# Patient Record
Sex: Female | Born: 1975 | Race: Black or African American | Hispanic: No | Marital: Single | Smoking: Current every day smoker
Health system: Southern US, Community
[De-identification: ages and names within clinical notes are randomized; demographics above are authoritative.]

## PROBLEM LIST (undated history)

## (undated) ENCOUNTER — Emergency Department (HOSPITAL_COMMUNITY): Payer: Self-pay | Source: Home / Self Care

## (undated) DIAGNOSIS — M5430 Sciatica, unspecified side: Secondary | ICD-10-CM

## (undated) DIAGNOSIS — F32A Depression, unspecified: Secondary | ICD-10-CM

## (undated) DIAGNOSIS — I10 Essential (primary) hypertension: Secondary | ICD-10-CM

## (undated) DIAGNOSIS — F419 Anxiety disorder, unspecified: Secondary | ICD-10-CM

## (undated) DIAGNOSIS — F329 Major depressive disorder, single episode, unspecified: Secondary | ICD-10-CM

## (undated) DIAGNOSIS — M1990 Unspecified osteoarthritis, unspecified site: Secondary | ICD-10-CM

## (undated) DIAGNOSIS — R569 Unspecified convulsions: Secondary | ICD-10-CM

## (undated) HISTORY — PX: TUBAL LIGATION: SHX77

## (undated) HISTORY — DX: Sciatica, unspecified side: M54.30

## (undated) HISTORY — DX: Unspecified osteoarthritis, unspecified site: M19.90

---

## 2004-08-22 ENCOUNTER — Emergency Department: Payer: Self-pay | Admitting: Emergency Medicine

## 2004-11-18 ENCOUNTER — Emergency Department: Payer: Self-pay | Admitting: Emergency Medicine

## 2004-11-18 ENCOUNTER — Other Ambulatory Visit: Payer: Self-pay

## 2004-11-21 ENCOUNTER — Emergency Department: Payer: Self-pay | Admitting: Emergency Medicine

## 2005-04-28 ENCOUNTER — Emergency Department: Payer: Self-pay | Admitting: Unknown Physician Specialty

## 2005-05-02 ENCOUNTER — Other Ambulatory Visit: Payer: Self-pay

## 2005-05-02 ENCOUNTER — Emergency Department: Payer: Self-pay | Admitting: Internal Medicine

## 2005-07-05 ENCOUNTER — Emergency Department: Payer: Self-pay | Admitting: General Practice

## 2005-08-27 ENCOUNTER — Emergency Department: Payer: Self-pay | Admitting: Emergency Medicine

## 2005-12-22 ENCOUNTER — Emergency Department: Payer: Self-pay | Admitting: Emergency Medicine

## 2006-07-24 ENCOUNTER — Emergency Department: Payer: Self-pay | Admitting: Emergency Medicine

## 2007-05-16 ENCOUNTER — Emergency Department: Payer: Self-pay | Admitting: Emergency Medicine

## 2007-06-22 ENCOUNTER — Emergency Department: Payer: Self-pay | Admitting: Emergency Medicine

## 2007-06-30 ENCOUNTER — Emergency Department: Payer: Self-pay | Admitting: Emergency Medicine

## 2007-07-07 ENCOUNTER — Emergency Department: Payer: Self-pay | Admitting: Emergency Medicine

## 2008-04-18 ENCOUNTER — Emergency Department: Payer: Self-pay | Admitting: Emergency Medicine

## 2008-07-22 ENCOUNTER — Emergency Department: Payer: Self-pay | Admitting: Emergency Medicine

## 2008-11-19 ENCOUNTER — Emergency Department: Payer: Self-pay | Admitting: Emergency Medicine

## 2010-07-12 ENCOUNTER — Emergency Department: Payer: Self-pay | Admitting: Emergency Medicine

## 2010-10-15 ENCOUNTER — Emergency Department: Payer: Self-pay | Admitting: Emergency Medicine

## 2010-11-20 ENCOUNTER — Emergency Department: Payer: Self-pay | Admitting: Emergency Medicine

## 2011-05-26 ENCOUNTER — Emergency Department: Payer: Self-pay | Admitting: *Deleted

## 2011-05-26 LAB — COMPREHENSIVE METABOLIC PANEL WITH GFR
Albumin: 3.3 g/dL — ABNORMAL LOW
Alkaline Phosphatase: 66 U/L
Anion Gap: 6 — ABNORMAL LOW
BUN: 6 mg/dL — ABNORMAL LOW
Bilirubin,Total: 0.6 mg/dL
Calcium, Total: 8.4 mg/dL — ABNORMAL LOW
Chloride: 105 mmol/L
Co2: 28 mmol/L
Creatinine: 0.84 mg/dL
EGFR (African American): >60
EGFR (Non-African Amer.): >60
Glucose: 124 mg/dL — ABNORMAL HIGH
Osmolality: 277
Potassium: 3.1 mmol/L — ABNORMAL LOW
SGOT(AST): 20 U/L
SGPT (ALT): 23 U/L
Sodium: 139 mmol/L
Total Protein: 7 g/dL

## 2011-05-26 LAB — CBC
HGB: 13.6 g/dL (ref 12.0–16.0)
MCH: 30.7 pg (ref 26.0–34.0)
MCHC: 33.7 g/dL (ref 32.0–36.0)
MCV: 91 fL (ref 80–100)
Platelet: 203 10*3/uL (ref 150–440)
RDW: 15.2 % — ABNORMAL HIGH (ref 11.5–14.5)
WBC: 9.5 10*3/uL (ref 3.6–11.0)

## 2012-01-03 ENCOUNTER — Emergency Department: Payer: Self-pay | Admitting: Emergency Medicine

## 2012-01-03 DIAGNOSIS — N9489 Other specified conditions associated with female genital organs and menstrual cycle: Secondary | ICD-10-CM | POA: Insufficient documentation

## 2012-01-03 DIAGNOSIS — R102 Pelvic and perineal pain: Secondary | ICD-10-CM | POA: Insufficient documentation

## 2012-01-03 LAB — PREGNANCY, URINE: Pregnancy Test, Urine: NEGATIVE m[IU]/mL

## 2012-01-03 LAB — URINALYSIS, COMPLETE
Hyaline Cast: 1
Ketone: NEGATIVE
Nitrite: POSITIVE
Ph: 6 (ref 4.5–8.0)
Protein: 30
RBC,UR: 1 /HPF (ref 0–5)
Specific Gravity: 1.016 (ref 1.003–1.030)
WBC UR: 35 /HPF (ref 0–5)

## 2012-01-03 LAB — CBC WITH DIFFERENTIAL/PLATELET
Basophil %: 0.5 %
Eosinophil %: 0.4 %
HCT: 39.9 % (ref 35.0–47.0)
HGB: 13.5 g/dL (ref 12.0–16.0)
Lymphocyte #: 2.8 10*3/uL (ref 1.0–3.6)
MCH: 31 pg (ref 26.0–34.0)
MCHC: 33.8 g/dL (ref 32.0–36.0)
MCV: 92 fL (ref 80–100)
Monocyte %: 4.3 %
Neutrophil %: 69 %
Platelet: 219 10*3/uL (ref 150–440)
RBC: 4.36 10*6/uL (ref 3.80–5.20)
WBC: 11 10*3/uL (ref 3.6–11.0)

## 2012-01-03 LAB — BASIC METABOLIC PANEL
Anion Gap: 8 (ref 7–16)
BUN: 13 mg/dL (ref 7–18)
Chloride: 107 mmol/L (ref 98–107)
Co2: 23 mmol/L (ref 21–32)
Creatinine: 0.81 mg/dL (ref 0.60–1.30)
Potassium: 3.4 mmol/L — ABNORMAL LOW (ref 3.5–5.1)
Sodium: 138 mmol/L (ref 136–145)

## 2012-01-03 LAB — PHENYTOIN LEVEL, TOTAL: Dilantin: 8.5 ug/mL — ABNORMAL LOW (ref 10.0–20.0)

## 2012-02-04 ENCOUNTER — Emergency Department: Payer: Self-pay | Admitting: Emergency Medicine

## 2012-02-04 LAB — COMPREHENSIVE METABOLIC PANEL
Albumin: 3.1 g/dL — ABNORMAL LOW (ref 3.4–5.0)
Alkaline Phosphatase: 121 U/L (ref 50–136)
Bilirubin,Total: <0.1 mg/dL — ABNORMAL LOW (ref 0.2–1.0)
Chloride: 112 mmol/L — ABNORMAL HIGH (ref 98–107)
Co2: 26 mmol/L (ref 21–32)
Creatinine: 0.86 mg/dL (ref 0.60–1.30)
Osmolality: 287 (ref 275–301)
Potassium: 3.5 mmol/L (ref 3.5–5.1)
SGOT(AST): 28 U/L (ref 15–37)
SGPT (ALT): 70 U/L (ref 12–78)
Total Protein: 6.7 g/dL (ref 6.4–8.2)

## 2012-02-04 LAB — CBC
HCT: 37.9 % (ref 35.0–47.0)
HGB: 12.5 g/dL (ref 12.0–16.0)
MCH: 30.1 pg (ref 26.0–34.0)
MCHC: 33 g/dL (ref 32.0–36.0)
MCV: 91 fL (ref 80–100)
Platelet: 214 x10 3/mm 3 (ref 150–440)
RBC: 4.17 X10 6/mm 3 (ref 3.80–5.20)
RDW: 14.4 % (ref 11.5–14.5)
WBC: 9.9 x10 3/mm 3 (ref 3.6–11.0)

## 2012-02-04 LAB — LIPASE, BLOOD: Lipase: 109 U/L (ref 73–393)

## 2012-09-27 LAB — COMPREHENSIVE METABOLIC PANEL
Albumin: 3.3 g/dL — ABNORMAL LOW (ref 3.4–5.0)
Alkaline Phosphatase: 73 U/L (ref 50–136)
BUN: 9 mg/dL (ref 7–18)
Calcium, Total: 8.8 mg/dL (ref 8.5–10.1)
Chloride: 107 mmol/L (ref 98–107)
Creatinine: 0.9 mg/dL (ref 0.60–1.30)
EGFR (African American): >60
EGFR (Non-African Amer.): >60
Osmolality: 277 (ref 275–301)
SGOT(AST): 20 U/L (ref 15–37)
SGPT (ALT): 24 U/L (ref 12–78)

## 2012-09-27 LAB — ETHANOL
Ethanol %: <0.003 % (ref 0.000–0.080)
Ethanol: <3 mg/dL

## 2012-09-27 LAB — DRUG SCREEN, URINE
Barbiturates, Ur Screen: NEGATIVE (ref ?–200)
Cannabinoid 50 Ng, Ur ~~LOC~~: POSITIVE (ref ?–50)
Cocaine Metabolite,Ur ~~LOC~~: NEGATIVE (ref ?–300)
Methadone, Ur Screen: NEGATIVE (ref ?–300)
Phencyclidine (PCP) Ur S: NEGATIVE (ref ?–25)

## 2012-09-27 LAB — URINALYSIS, COMPLETE
Blood: NEGATIVE
Cellular Cast: 2
Granular Cast: 2
Ketone: NEGATIVE
Leukocyte Esterase: NEGATIVE
Nitrite: NEGATIVE
Protein: 25
RBC,UR: 2 /HPF (ref 0–5)

## 2012-09-27 LAB — CBC
MCHC: 34.6 g/dL (ref 32.0–36.0)
RBC: 4.59 10*6/uL (ref 3.80–5.20)

## 2012-09-28 ENCOUNTER — Inpatient Hospital Stay: Payer: Self-pay | Admitting: Psychiatry

## 2013-01-24 ENCOUNTER — Encounter: Payer: Self-pay | Admitting: Family Medicine

## 2013-06-28 ENCOUNTER — Emergency Department: Payer: Self-pay | Admitting: Emergency Medicine

## 2013-06-28 LAB — TROPONIN I

## 2013-06-28 LAB — CBC
HCT: 41.3 % (ref 35.0–47.0)
HGB: 13.8 g/dL (ref 12.0–16.0)
MCH: 31.2 pg (ref 26.0–34.0)
MCHC: 33.5 g/dL (ref 32.0–36.0)
MCV: 93 fL (ref 80–100)
Platelet: 198 10*3/uL (ref 150–440)
RBC: 4.44 10*6/uL (ref 3.80–5.20)
RDW: 14.6 % — ABNORMAL HIGH (ref 11.5–14.5)
WBC: 10.6 10*3/uL (ref 3.6–11.0)

## 2013-06-28 LAB — BASIC METABOLIC PANEL
Anion Gap: 9 (ref 7–16)
BUN: 8 mg/dL (ref 7–18)
CREATININE: 0.88 mg/dL (ref 0.60–1.30)
Calcium, Total: 8.4 mg/dL — ABNORMAL LOW (ref 8.5–10.1)
Chloride: 106 mmol/L (ref 98–107)
Co2: 24 mmol/L (ref 21–32)
EGFR (Non-African Amer.): >60
Glucose: 143 mg/dL — ABNORMAL HIGH (ref 65–99)
Osmolality: 278 (ref 275–301)
POTASSIUM: 3.3 mmol/L — AB (ref 3.5–5.1)
Sodium: 139 mmol/L (ref 136–145)

## 2013-06-28 LAB — PHENYTOIN LEVEL, TOTAL: DILANTIN: 0.8 ug/mL — AB (ref 10.0–20.0)

## 2013-09-12 ENCOUNTER — Emergency Department: Payer: Self-pay | Admitting: Emergency Medicine

## 2013-09-12 LAB — PHENYTOIN LEVEL, TOTAL: DILANTIN: 12 ug/mL (ref 10.0–20.0)

## 2013-09-30 ENCOUNTER — Emergency Department: Payer: Self-pay | Admitting: Emergency Medicine

## 2014-01-02 ENCOUNTER — Emergency Department: Payer: Self-pay | Admitting: Emergency Medicine

## 2014-01-06 ENCOUNTER — Emergency Department: Payer: Self-pay | Admitting: Emergency Medicine
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
## 2014-06-14 ENCOUNTER — Emergency Department: Admit: 2014-06-14 | Discharge status: Against Medical Advice | Payer: Self-pay | Admitting: Emergency Medicine

## 2014-06-14 LAB — URINALYSIS, COMPLETE
Bilirubin,UR: NEGATIVE
Glucose,UR: NEGATIVE mg/dL (ref 0–75)
LEUKOCYTE ESTERASE: NEGATIVE
NITRITE: NEGATIVE
PH: 5 (ref 4.5–8.0)
Specific Gravity: 1.027 (ref 1.003–1.030)

## 2014-06-14 LAB — BASIC METABOLIC PANEL
ANION GAP: 9 (ref 7–16)
BUN: 10 mg/dL
Calcium, Total: 8.6 mg/dL — ABNORMAL LOW
Chloride: 107 mmol/L
Co2: 25 mmol/L
Creatinine: 1.01 mg/dL — ABNORMAL HIGH
EGFR (African American): >60
Glucose: 111 mg/dL — ABNORMAL HIGH
Potassium: 3 mmol/L — ABNORMAL LOW
SODIUM: 141 mmol/L

## 2014-06-14 LAB — PHENYTOIN LEVEL, TOTAL: DILANTIN: 3.5 ug/mL — AB

## 2014-06-14 LAB — PREGNANCY, URINE: Pregnancy Test, Urine: NEGATIVE m[IU]/mL

## 2014-06-14 LAB — MAGNESIUM: Magnesium: 2 mg/dL

## 2014-06-14 LAB — ETHANOL: Ethanol: <5 mg/dL

## 2015-08-16 ENCOUNTER — Encounter: Payer: Self-pay | Admitting: Emergency Medicine

## 2015-08-16 ENCOUNTER — Emergency Department
Admission: EM | Admit: 2015-08-16 | Discharge: 2015-08-16 | Disposition: A | Discharge status: Home / Self Care | Payer: Self-pay | Attending: Emergency Medicine | Admitting: Emergency Medicine

## 2015-08-16 DIAGNOSIS — Z5181 Encounter for therapeutic drug level monitoring: Secondary | ICD-10-CM

## 2015-08-16 DIAGNOSIS — I10 Essential (primary) hypertension: Secondary | ICD-10-CM | POA: Insufficient documentation

## 2015-08-16 DIAGNOSIS — G40909 Epilepsy, unspecified, not intractable, without status epilepticus: Secondary | ICD-10-CM | POA: Insufficient documentation

## 2015-08-16 DIAGNOSIS — E876 Hypokalemia: Secondary | ICD-10-CM | POA: Insufficient documentation

## 2015-08-16 DIAGNOSIS — Z87891 Personal history of nicotine dependence: Secondary | ICD-10-CM | POA: Insufficient documentation

## 2015-08-16 HISTORY — DX: Essential (primary) hypertension: I10

## 2015-08-16 HISTORY — DX: Unspecified convulsions: R56.9

## 2015-08-16 LAB — CBC WITH DIFFERENTIAL/PLATELET
BASOS ABS: 0.3 10*3/uL — AB (ref 0–0.1)
BASOS PCT: 3 %
EOS ABS: 0.1 10*3/uL (ref 0–0.7)
Eosinophils Relative: 1 %
HEMATOCRIT: 39.6 % (ref 35.0–47.0)
Hemoglobin: 13.5 g/dL (ref 12.0–16.0)
Lymphocytes Relative: 27 %
Lymphs Abs: 2.9 10*3/uL (ref 1.0–3.6)
MCH: 30.4 pg (ref 26.0–34.0)
MCHC: 34 g/dL (ref 32.0–36.0)
MCV: 89.3 fL (ref 80.0–100.0)
Monocytes Absolute: 0.4 10*3/uL (ref 0.2–0.9)
Monocytes Relative: 4 %
NEUTROS PCT: 65 %
Neutro Abs: 7.1 10*3/uL — ABNORMAL HIGH (ref 1.4–6.5)
Platelets: 189 10*3/uL (ref 150–440)
RBC: 4.44 MIL/uL (ref 3.80–5.20)
RDW: 14.9 % — AB (ref 11.5–14.5)
WBC: 10.8 10*3/uL (ref 3.6–11.0)

## 2015-08-16 LAB — BASIC METABOLIC PANEL
Anion gap: 9 (ref 5–15)
BUN: 12 mg/dL (ref 6–20)
CALCIUM: 8.5 mg/dL — AB (ref 8.9–10.3)
CO2: 23 mmol/L (ref 22–32)
CREATININE: 0.85 mg/dL (ref 0.44–1.00)
Chloride: 106 mmol/L (ref 101–111)
Glucose, Bld: 106 mg/dL — ABNORMAL HIGH (ref 65–99)
Potassium: 3.3 mmol/L — ABNORMAL LOW (ref 3.5–5.1)
SODIUM: 138 mmol/L (ref 135–145)

## 2015-08-16 LAB — PHENYTOIN LEVEL, TOTAL

## 2015-08-16 MED ORDER — SODIUM CHLORIDE 0.9 % IV SOLN
1000.0000 mg | Freq: Once | INTRAVENOUS | Status: AC
  Administered 2015-08-16: 1000 mg via INTRAVENOUS
  Filled 2015-08-16: qty 20

## 2015-08-16 MED ORDER — POTASSIUM CHLORIDE CRYS ER 20 MEQ PO TBCR
40.0000 meq | EXTENDED_RELEASE_TABLET | Freq: Once | ORAL | Status: AC
  Administered 2015-08-16: 40 meq via ORAL
  Filled 2015-08-16: qty 2

## 2015-08-16 MED ORDER — PHENYTOIN SODIUM EXTENDED 100 MG PO CAPS: 300.0000 mg | ORAL_CAPSULE | Freq: Two times a day (BID) | ORAL | Status: DC

## 2015-08-16 NOTE — ED Notes (Signed)
Pt presents to ED via [facility] for seizures. Per EMS pt has hx of seizures and states that per family pt had 4 seizures prior to EMS arrival and 1 grand mal seizure with EMS last approx 2 mins. Per EMS 2 mg Versed given en route, pt presents post-ictal but able to answer some questions at this time.
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
## 2015-08-16 NOTE — ED Notes (Signed)
NAD noted at time of D/C. Pt taken to the lobby via wheelchair. Pt states understanding of D/C instructions, denies comments/concerns at this time.

## 2015-08-16 NOTE — ED Notes (Signed)
Pt states she takes Dilantin, and has not had her medications today.

## 2015-08-16 NOTE — Discharge Instructions (Signed)
You were evaluated and treated for seizure. Return to emergency department for any worsening condition including repeat seizure, any confusion altered mental status, weakness or numbness, fever, or any other symptoms concerning to you.   Epilepsy Epilepsy is a disorder in which a person has repeated seizures over time. A seizure is a release of abnormal electrical activity in the brain. Seizures can cause a change in attention, behavior, or the ability to remain awake and alert (altered mental status). Seizures often involve uncontrollable shaking (convulsions).  Most people with epilepsy lead normal lives. However, people with epilepsy are at an increased risk of falls, accidents, and injuries. Therefore, it is important to begin treatment right away. CAUSES  Epilepsy has many possible causes. Anything that disturbs the normal pattern of brain cell activity can lead to seizures. This may include:   Head injury.  Birth trauma.  High fever as a child.  Stroke.  Bleeding into or around the brain.  Certain drugs.  Prolonged low oxygen, such as what occurs after CPR efforts.  Abnormal brain development.  Certain illnesses, such as meningitis, encephalitis (brain infection), malaria, and other infections.  An imbalance of nerve signaling chemicals (neurotransmitters).  SIGNS AND SYMPTOMS  The symptoms of a seizure can vary greatly from one person to another. Right before a seizure, you may have a warning (aura) that a seizure is about to occur. An aura may include the following symptoms:  Fear or anxiety.  Nausea.  Feeling like the room is spinning (vertigo).  Vision changes, such as seeing flashing lights or spots. Common symptoms during a seizure include:  Abnormal sensations, such as an abnormal smell or a bitter taste in the mouth.   Sudden, general body stiffness.   Convulsions that involve rhythmic jerking of the face, arm, or leg on one or both sides.   Sudden  change in consciousness.   Appearing to be awake but not responding.   Appearing to be asleep but cannot be awakened.   Grimacing, chewing, lip smacking, drooling, tongue biting, or loss of bowel or bladder control. After a seizure, you may feel sleepy for a while. DIAGNOSIS  Your health care provider will ask about your symptoms and take a medical history. Descriptions from any witnesses to your seizures will be very helpful in the diagnosis. A physical exam, including a detailed neurological exam, is necessary. Various tests may be done, such as:   An electroencephalogram (EEG). This is a painless test of your brain waves. In this test, a diagram is created of your brain waves. These diagrams can be interpreted by a specialist.  An MRI of the brain.   A CT scan of the brain.   A spinal tap (lumbar puncture, LP).  Blood tests to check for signs of infection or abnormal blood chemistry. TREATMENT  There is no cure for epilepsy, but it is generally treatable. Once epilepsy is diagnosed, it is important to begin treatment as soon as possible. For most people with epilepsy, seizures can be controlled with medicines. The following may also be used:  A pacemaker for the brain (vagus nerve stimulator) can be used for people with seizures that are not well controlled by medicine.  Surgery on the brain. For some people, epilepsy eventually goes away. HOME CARE INSTRUCTIONS   Follow your health care provider's recommendations on driving and safety in normal activities.  Get enough rest. Lack of sleep can cause seizures.  Only take over-the-counter or prescription medicines as directed by your health  care provider. Take any prescribed medicine exactly as directed.  Avoid any known triggers of your seizures.  Keep a seizure diary. Record what you recall about any seizure, especially any possible trigger.   Make sure the people you live and work with know that you are prone to  seizures. They should receive instructions on how to help you. In general, a witness to a seizure should:   Cushion your head and body.   Turn you on your side.   Avoid unnecessarily restraining you.   Not place anything inside your mouth.   Call for emergency medical help if there is any question about what has occurred.   Follow up with your health care provider as directed. You may need regular blood tests to monitor the levels of your medicine.  SEEK MEDICAL CARE IF:   You develop signs of infection or other illness. This might increase the risk of a seizure.   You seem to be having more frequent seizures.   Your seizure pattern is changing.  SEEK IMMEDIATE MEDICAL CARE IF:   You have a seizure that does not stop after a few moments.   You have a seizure that causes any difficulty in breathing.   You have a seizure that results in a very severe headache.   You have a seizure that leaves you with the inability to speak or use a part of your body.    This information is not intended to replace advice given to you by your health care provider. Make sure you discuss any questions you have with your health care provider.   Document Released: 02/01/2005 Document Revised: 11/22/2012 Document Reviewed: 09/13/2012 Elsevier Interactive Patient Education [employer]2016 Elsevier Inc.

## 2015-08-16 NOTE — ED Notes (Signed)
NAD noted at this time. Pt given phone at this time. States that she has a HA. NO change in patient condition. Pt is alert and oriented to person, place, and time. Pt sitting up in bed, lights dimmed for patient comfort at this time.

## 2015-11-14 ENCOUNTER — Encounter (HOSPITAL_COMMUNITY): Payer: Self-pay | Admitting: Emergency Medicine

## 2015-11-14 ENCOUNTER — Emergency Department (HOSPITAL_COMMUNITY)
Admission: EM | Admit: 2015-11-14 | Discharge: 2015-11-15 | Disposition: A | Discharge status: Home / Self Care | Payer: Self-pay | Attending: Emergency Medicine | Admitting: Emergency Medicine

## 2015-11-14 DIAGNOSIS — T426X6A Underdosing of other antiepileptic and sedative-hypnotic drugs, initial encounter: Secondary | ICD-10-CM | POA: Insufficient documentation

## 2015-11-14 DIAGNOSIS — R569 Unspecified convulsions: Secondary | ICD-10-CM

## 2015-11-14 DIAGNOSIS — G40909 Epilepsy, unspecified, not intractable, without status epilepticus: Secondary | ICD-10-CM | POA: Insufficient documentation

## 2015-11-14 DIAGNOSIS — I10 Essential (primary) hypertension: Secondary | ICD-10-CM | POA: Insufficient documentation

## 2015-11-14 DIAGNOSIS — Z87891 Personal history of nicotine dependence: Secondary | ICD-10-CM | POA: Insufficient documentation

## 2015-11-14 LAB — CBG MONITORING, ED: Glucose-Capillary: 123 mg/dL — ABNORMAL HIGH (ref 65–99)

## 2015-11-14 MED ORDER — PHENYTOIN SODIUM EXTENDED 100 MG PO CAPS: 300.0000 mg | ORAL_CAPSULE | Freq: Two times a day (BID) | ORAL | 0 refills | Status: DC

## 2015-11-14 MED ORDER — SODIUM CHLORIDE 0.9 % IV SOLN
2000.0000 mg | Freq: Once | INTRAVENOUS | Status: AC
  Administered 2015-11-14: 2000 mg via INTRAVENOUS
  Filled 2015-11-14: qty 40

## 2015-11-14 MED ORDER — ONDANSETRON HCL 4 MG/2ML IJ SOLN
4.0000 mg | Freq: Once | INTRAMUSCULAR | Status: AC
  Administered 2015-11-14: 4 mg via INTRAVENOUS
  Filled 2015-11-14: qty 2

## 2015-11-14 NOTE — Progress Notes (Signed)
[facility].  EDCM discussed patient with EDP Adela LankFloyd.  EDCM will fax [employer] coupon for dilantin to [facility].  [place] card program.  [facility] clinics [place] so that patient can find a pcp.  [facility] this information to [phone].  No further EDCM needs at this time.  Coupon from [employer] is for Dilantin 100mg  capsules #180 tablets [facility] at [facility]

## 2015-11-14 NOTE — ED Provider Notes (Signed)
[facility]-EMERGENCY DEPT Provider Note   CSN: [id number] Arrival date & time: 11/14/15  2014     History   Chief Complaint Chief Complaint  Patient presents with  . Seizures    HPI Kristy Kirby is a 40 y.o. female.  40 yo F with a chief complaint of a seizure. Asian has been off of her seizure medications because she is unable to afford it. She recently lost her Medicaid because her child turned [id number]. She is working on trying to get insurance through her work. She thinks she might have this accomplished next week. Denies head injury denies fevers.   The history is provided by the patient.  Seizures   This is a new problem. The current episode started less than 1 hour ago. The problem has been resolved. There was 1 seizure. The most recent episode lasted less than 30 seconds. Pertinent negatives include no headaches, no visual disturbance, no chest pain, no nausea and no vomiting. Characteristics include loss of consciousness. The episode was witnessed. The seizures did not continue in the ED. The seizure(s) had no focality. Possible causes include missed seizure meds. There has been no fever.    Past Medical History:  Diagnosis Date  . HTN (hypertension)   . Seizures (HCC)     There are no active problems to display for this patient.   Past Surgical History:  Procedure Laterality Date  . TUBAL LIGATION      OB History    No data available       Home Medications    Prior to Admission medications   Medication Sig Start Date End Date Taking? Authorizing Provider  phenytoin (DILANTIN) 100 MG ER capsule Take 3 capsules (300 mg total) by mouth 2 (two) times daily. 11/14/15   Melene Planan Saharah Sherrow, DO    Family History No family history on file.  Social History Social History  Substance Use Topics  . Smoking status: Former Games developermoker  . Smokeless tobacco: Not on file  . Alcohol use No     Allergies   Review of patient's allergies indicates no known allergies.   Review of  Systems Review of Systems  Constitutional: Negative for chills and fever.  HENT: Negative for congestion and rhinorrhea.   Eyes: Negative for redness and visual disturbance.  Respiratory: Negative for shortness of breath and wheezing.   Cardiovascular: Negative for chest pain and palpitations.  Gastrointestinal: Negative for nausea and vomiting.  Genitourinary: Negative for dysuria and urgency.  Musculoskeletal: Negative for arthralgias and myalgias.  Skin: Negative for pallor and wound.  Neurological: Positive for seizures and loss of consciousness. Negative for dizziness and headaches.     Physical Exam Updated Vital Signs BP 113/89   Pulse 87   Temp 98.6 F (37 C) (Oral)   Resp 15   Ht 5\' 2"  (1.575 m)   Wt 244 lb (110.7 kg)   SpO2 96%   BMI 44.63 kg/m   Physical Exam  Constitutional: She is oriented to person, place, and time. She appears well-developed and well-nourished. No distress.  HENT:  Head: Normocephalic and atraumatic.  Eyes: EOM are normal. Pupils are equal, round, and reactive to light.  Neck: Normal range of motion. Neck supple.  Cardiovascular: Normal rate and regular rhythm.  Exam reveals no gallop and no friction rub.   No murmur heard. Pulmonary/Chest: Effort normal. She has no wheezes. She has no rales.  Abdominal: Soft. She exhibits no distension. There is no tenderness.  Musculoskeletal: She exhibits no  edema or tenderness.  Neurological: She is alert and oriented to person, place, and time.  Grossly neurologically intact  Skin: Skin is warm and dry. She is not diaphoretic.  Psychiatric: She has a normal mood and affect. Her behavior is normal.  Nursing note and vitals reviewed.    ED Treatments / Results  Labs (all labs ordered are listed, but only abnormal results are displayed) Labs Reviewed  CBG MONITORING, ED - Abnormal; Notable for the following:       Result Value   Glucose-Capillary 123 (*)    All other components within normal  limits    EKG  EKG Interpretation None       Radiology No results found.  Procedures Procedures (including critical care time)  Medications Ordered in ED Medications  phenytoin (DILANTIN) 2,000 mg in sodium chloride 0.9 % 250 mL IVPB (0 mg Intravenous Stopped 11/14/15 2326)     Initial Impression / Assessment and Plan / ED Course  I have reviewed the triage vital signs and the nursing notes.  Pertinent labs & imaging results that were available during my care of the patient were reviewed by me and considered in my medical decision making (see chart for details).  Clinical Course    40 yo F With a chief complaint of a seizure. Patient has a history of a seizure disorder has not been taking her medications. She was loaded back on her Dilantin. Given a prescription. Case management saw the patient and offered help with the medicines that she is from out of [place].  11:36 PM:  I have discussed the diagnosis/risks/treatment options with the patient and believe the pt to be eligible for discharge home to follow-up with PCP, neurology. We also discussed returning to the ED immediately if new or worsening sx occur. We discussed the sx which are most concerning (e.g., sudden worsening pain, fever, inability to tolerate by mouth) that necessitate immediate return. Medications administered to the patient during their visit and any new prescriptions provided to the patient are listed below.  Medications given during this visit Medications  phenytoin (DILANTIN) 2,000 mg in sodium chloride 0.9 % 250 mL IVPB (0 mg Intravenous Stopped 11/14/15 2326)     The patient appears reasonably screen and/or stabilized for discharge and I doubt any other medical condition or other [facility] requiring further screening, evaluation, or treatment in the ED at this time prior to discharge.    Final Clinical Impressions(s) / ED Diagnoses   Final diagnoses:  Seizure [facility])    New Prescriptions Current  Discharge Medication List       Melene Plan, DO 11/14/15 2336

## 2015-11-14 NOTE — ED Triage Notes (Signed)
Per EMS, pt experienced a witnessed seizure at work, she did not fall, was lowered to the floor from a chair by coworker.  Hx of seizures and HNT.  She reports that she has not had her medications b/c "my medicaid was cut off".  EMS called sister who reported stress at home and questioned medication compliance.  Pt was postictal upon EMS arrival, "beginning to come around now."  She is alert and oriented at this time.

## 2016-01-28 ENCOUNTER — Emergency Department
Admission: EM | Admit: 2016-01-28 | Discharge: 2016-01-28 | Disposition: A | Discharge status: Home / Self Care | Payer: Self-pay | Attending: Emergency Medicine | Admitting: Emergency Medicine

## 2016-01-28 DIAGNOSIS — Z760 Encounter for issue of repeat prescription: Secondary | ICD-10-CM | POA: Insufficient documentation

## 2016-01-28 DIAGNOSIS — Z87891 Personal history of nicotine dependence: Secondary | ICD-10-CM | POA: Insufficient documentation

## 2016-01-28 DIAGNOSIS — R519 Headache, unspecified: Secondary | ICD-10-CM

## 2016-01-28 DIAGNOSIS — R51 Headache: Secondary | ICD-10-CM

## 2016-01-28 DIAGNOSIS — I10 Essential (primary) hypertension: Secondary | ICD-10-CM | POA: Insufficient documentation

## 2016-01-28 HISTORY — DX: Anxiety disorder, unspecified: F41.9

## 2016-01-28 HISTORY — DX: Depression, unspecified: F32.A

## 2016-01-28 HISTORY — DX: Major depressive disorder, single episode, unspecified: F32.9

## 2016-01-28 LAB — CBC
HEMATOCRIT: 44.4 % (ref 35.0–47.0)
HEMOGLOBIN: 15 g/dL (ref 12.0–16.0)
MCH: 30.9 pg (ref 26.0–34.0)
MCHC: 33.8 g/dL (ref 32.0–36.0)
MCV: 91.5 fL (ref 80.0–100.0)
Platelets: 189 10*3/uL (ref 150–440)
RBC: 4.85 MIL/uL (ref 3.80–5.20)
RDW: 15.1 % — AB (ref 11.5–14.5)
WBC: 8.9 10*3/uL (ref 3.6–11.0)

## 2016-01-28 LAB — BASIC METABOLIC PANEL
ANION GAP: 8 (ref 5–15)
BUN: 11 mg/dL (ref 6–20)
CHLORIDE: 105 mmol/L (ref 101–111)
CO2: 25 mmol/L (ref 22–32)
Calcium: 8.6 mg/dL — ABNORMAL LOW (ref 8.9–10.3)
Creatinine, Ser: 0.83 mg/dL (ref 0.44–1.00)
GFR calc Af Amer: >60 mL/min (ref >60)
GFR calc non Af Amer: >60 mL/min (ref >60)
GLUCOSE: 83 mg/dL (ref 65–99)
POTASSIUM: 3.5 mmol/L (ref 3.5–5.1)
Sodium: 138 mmol/L (ref 135–145)

## 2016-01-28 MED ORDER — METOCLOPRAMIDE HCL 10 MG PO TABS
10.0000 mg | ORAL_TABLET | Freq: Once | ORAL | Status: AC
  Administered 2016-01-28: 10 mg via ORAL
  Filled 2016-01-28 (×2): qty 1

## 2016-01-28 MED ORDER — DIPHENHYDRAMINE HCL 25 MG PO CAPS: 50.0000 mg | ORAL_CAPSULE | Freq: Four times a day (QID) | ORAL | 0 refills | Status: DC | PRN

## 2016-01-28 MED ORDER — HYDROCHLOROTHIAZIDE 25 MG PO TABS: 25.0000 mg | ORAL_TABLET | Freq: Every day | ORAL | 2 refills | Status: DC

## 2016-01-28 MED ORDER — DIPHENHYDRAMINE HCL 25 MG PO CAPS
50.0000 mg | ORAL_CAPSULE | Freq: Once | ORAL | Status: AC
  Administered 2016-01-28: 50 mg via ORAL
  Filled 2016-01-28: qty 2

## 2016-01-28 MED ORDER — LISINOPRIL 20 MG PO TABS: 20.0000 mg | ORAL_TABLET | Freq: Every day | ORAL | 2 refills | Status: DC

## 2016-01-28 MED ORDER — PHENYTOIN SODIUM EXTENDED 100 MG PO CAPS: 300.0000 mg | ORAL_CAPSULE | Freq: Every day | ORAL | 2 refills | Status: DC

## 2016-01-28 MED ORDER — METOCLOPRAMIDE HCL 10 MG PO TABS: 10.0000 mg | ORAL_TABLET | Freq: Four times a day (QID) | ORAL | 0 refills | Status: DC | PRN

## 2016-01-28 NOTE — ED Provider Notes (Signed)
[facility] Emergency Department Provider Note  ____________________________________________  Time seen: Approximately 4:46 PM  I have reviewed the triage vital signs and the nursing notes.   HISTORY  Chief Complaint Headache    HPI Kristy Kirby is a 40 y.o. female who complains of generalized headache for the past 2-3 days. No vision changes numbness tingling or weakness. No dizziness or syncope. Bilateral frontal parietal and occipital. No trauma. No nausea or vomiting. Tried taking ibuprofen without relief.  She recently lost her Medicaid coverage because her son turned 618, so she is uninsured. Because of that she has not been able to follow up with her doctor and ran out of all her medications about 3 months ago. This includes lisinopril HCTZ phenytoin and Prozac. Denies SI HI or worsening depressive symptoms. Reports a seizure about 3 months ago.   Past Medical History:  Diagnosis Date  . Anxiety   . Depression   . HTN (hypertension)   . Seizures (HCC)      There are no active problems to display for this patient.    Past Surgical History:  Procedure Laterality Date  . TUBAL LIGATION       Prior to Admission medications   Medication Sig Start Date End Date Taking? Authorizing Provider  diphenhydrAMINE (BENADRYL) 25 mg capsule Take 2 capsules (50 mg total) by mouth every 6 (six) hours as needed. 01/28/16   Sharman CheekPhillip Analyah Mcconnon, MD  hydrochlorothiazide (HYDRODIURIL) 25 MG tablet Take 1 tablet (25 mg total) by mouth daily. 01/28/16   Sharman CheekPhillip Lizzie Cokley, MD  lisinopril (PRINIVIL,ZESTRIL) 20 MG tablet Take 1 tablet (20 mg total) by mouth daily. 01/28/16 01/27/17  Sharman CheekPhillip Dior Stepter, MD  metoCLOPramide (REGLAN) 10 MG tablet Take 1 tablet (10 mg total) by mouth every 6 (six) hours as needed. 01/28/16   Sharman CheekPhillip Duey Liller, MD  phenytoin (DILANTIN) 100 MG ER capsule Take 3 capsules (300 mg total) by mouth daily. 01/28/16   Sharman CheekPhillip Shameca Landen, MD      Allergies Banana; Coconut fatty acids; Penicillins; and Sulfa antibiotics   No family history on file.  Social History Social History  Substance Use Topics  . Smoking status: Former Games developermoker  . Smokeless tobacco: Not on file  . Alcohol use No    Review of Systems  Constitutional:   No fever or chills.  ENT:   No sore throat. No rhinorrhea. Cardiovascular:   No chest pain. Respiratory:   No dyspnea or cough. Gastrointestinal:   Negative for abdominal pain, vomiting and diarrhea.  Genitourinary:   Negative for dysuria or difficulty urinating. Musculoskeletal:   Negative for focal pain or swelling Neurological:   Positive as above for headaches 10-point ROS otherwise negative.  ____________________________________________   PHYSICAL EXAM:  VITAL SIGNS: ED Triage Vitals  Enc Vitals Group     BP 01/28/16 1529 (!) 178/120     Pulse Rate 01/28/16 1529 91     Resp 01/28/16 1529 18     Temp 01/28/16 1529 97.9 F (36.6 C)     Temp Source 01/28/16 1529 Oral     SpO2 01/28/16 1529 100 %     Weight 01/28/16 1530 235 lb (106.6 kg)     Height 01/28/16 1530 5\' 2"  (1.575 m)     Head Circumference --      Peak Flow --      Pain Score 01/28/16 1530 7     Pain Loc --      Pain Edu? --  Excl. in GC? --     Vital signs reviewed, nursing assessments reviewed.   Constitutional:   Alert and oriented. Well appearing and in no distress. Eyes:   No scleral icterus. No conjunctival pallor. PERRL. EOMI.  No nystagmus. ENT   Head:   Normocephalic and atraumatic.Normal temporal artery pulsations, nontender   Nose:   No congestion/rhinnorhea. No septal hematoma   Mouth/Throat:   MMM, no pharyngeal erythema. No peritonsillar mass.    Neck:   No stridor. No SubQ emphysema. No meningismus. No midline spinal tenderness Hematological/Lymphatic/Immunilogical:   No cervical lymphadenopathy. Cardiovascular:   RRR. Symmetric bilateral radial and DP pulses.  No murmurs.   Respiratory:   Normal respiratory effort without tachypnea nor retractions. Breath sounds are clear and equal bilaterally. No wheezes/rales/rhonchi. Gastrointestinal:   Soft and nontender. Non distended. There is no CVA tenderness.  No rebound, rigidity, or guarding. Genitourinary:   deferred Musculoskeletal:   Nontender with normal range of motion in all extremities. No joint effusions.  No lower extremity tenderness.  No edema. Neurologic:   Normal speech and language.  CN 2-10 normal. Motor grossly intact. No gross focal neurologic deficits are appreciated.  Skin:    Skin is warm, dry and intact. No rash noted.  No petechiae, purpura, or bullae.  ____________________________________________    LABS (pertinent positives/negatives) (all labs ordered are listed, but only abnormal results are displayed) Labs Reviewed  BASIC METABOLIC PANEL - Abnormal; Notable for the following:       Result Value   Calcium 8.6 (*)    All other components within normal limits  CBC - Abnormal; Notable for the following:    RDW 15.1 (*)    All other components within normal limits   ____________________________________________   EKG  Interpreted by me Normal sinus rhythm rate of 84, normal axis intervals QRS ST segments and T waves.  ____________________________________________    RADIOLOGY    ____________________________________________   PROCEDURES Procedures  ____________________________________________   INITIAL IMPRESSION / ASSESSMENT AND PLAN / ED COURSE  Pertinent labs & imaging results that were available during my care of the patient were reviewed by me and considered in my medical decision making (see chart for details).  Patient well appearing no acute distress. Presents with headache in the setting of uncontrolled hypertension. Does not appear to be a hypertensive crisis. No other symptoms, no neuro deficits or other concerning exam features. Labs unremarkable, vitals  otherwise unremarkable except for stage III hypertension. I'll restart the patient on lisinopril and HCTZ which she was on in the past. I refill her phenytoin. Reglan and Benadryl for her headache for now. Follow up with primary care. [facility] resources given.     Clinical Course    ____________________________________________   FINAL CLINICAL IMPRESSION(S) / ED DIAGNOSES  Final diagnoses:  Essential hypertension  Generalized headache  Medication refill    New Prescriptions   DIPHENHYDRAMINE (BENADRYL) 25 MG CAPSULE    Take 2 capsules (50 mg total) by mouth every 6 (six) hours as needed.   HYDROCHLOROTHIAZIDE (HYDRODIURIL) 25 MG TABLET    Take 1 tablet (25 mg total) by mouth daily.   LISINOPRIL (PRINIVIL,ZESTRIL) 20 MG TABLET    Take 1 tablet (20 mg total) by mouth daily.   METOCLOPRAMIDE (REGLAN) 10 MG TABLET    Take 1 tablet (10 mg total) by mouth every 6 (six) hours as needed.   PHENYTOIN (DILANTIN) 100 MG ER CAPSULE    Take 3 capsules (300 mg total)  by mouth daily.     Portions of this note were generated with dragon dictation software. Dictation errors may occur despite best attempts at proofreading.    Sharman Cheek, MD 01/28/16 [phone]

## 2016-01-28 NOTE — ED Triage Notes (Addendum)
Headache X 2-3 days, nausea. Ibuprofen not helping headache. Pt alert and oriented X4, active, cooperative, pt in NAD. RR even and unlabored, color WNL.  Ambulatory.  Pt stopped taking her hypertension medications, "a few months ago" due to running out.

## 2016-01-28 NOTE — ED Notes (Signed)
Pt with headache 8/10 pain.  a/o, vss, perrl. rom x 4. No weakness or numbness. oob independently. HTN. NAD

## 2016-01-30 ENCOUNTER — Emergency Department
Admission: EM | Admit: 2016-01-30 | Discharge: 2016-01-30 | Disposition: A | Discharge status: Home / Self Care | Payer: Self-pay | Attending: Emergency Medicine | Admitting: Emergency Medicine

## 2016-01-30 DIAGNOSIS — I10 Essential (primary) hypertension: Secondary | ICD-10-CM | POA: Insufficient documentation

## 2016-01-30 DIAGNOSIS — Z5321 Procedure and treatment not carried out due to patient leaving prior to being seen by health care provider: Secondary | ICD-10-CM | POA: Insufficient documentation

## 2016-01-30 DIAGNOSIS — M79606 Pain in leg, unspecified: Secondary | ICD-10-CM | POA: Insufficient documentation

## 2016-01-30 DIAGNOSIS — Z87891 Personal history of nicotine dependence: Secondary | ICD-10-CM | POA: Insufficient documentation

## 2016-04-01 ENCOUNTER — Encounter: Payer: Self-pay | Admitting: *Deleted

## 2016-04-01 ENCOUNTER — Emergency Department
Admission: EM | Admit: 2016-04-01 | Discharge: 2016-04-01 | Disposition: A | Discharge status: Home / Self Care | Payer: Self-pay | Attending: Emergency Medicine | Admitting: Emergency Medicine

## 2016-04-01 DIAGNOSIS — J111 Influenza due to unidentified influenza virus with other respiratory manifestations: Secondary | ICD-10-CM | POA: Insufficient documentation

## 2016-04-01 DIAGNOSIS — I10 Essential (primary) hypertension: Secondary | ICD-10-CM | POA: Insufficient documentation

## 2016-04-01 DIAGNOSIS — Z87891 Personal history of nicotine dependence: Secondary | ICD-10-CM | POA: Insufficient documentation

## 2016-04-01 DIAGNOSIS — Z79899 Other long term (current) drug therapy: Secondary | ICD-10-CM | POA: Insufficient documentation

## 2016-04-01 MED ORDER — OSELTAMIVIR PHOSPHATE 75 MG PO CAPS: 75.0000 mg | ORAL_CAPSULE | Freq: Two times a day (BID) | ORAL | 0 refills | Status: DC

## 2016-04-01 MED ORDER — ONDANSETRON HCL 4 MG PO TABS
4.0000 mg | ORAL_TABLET | Freq: Once | ORAL | Status: AC
  Administered 2016-04-01: 4 mg via ORAL
  Filled 2016-04-01: qty 1

## 2016-04-01 MED ORDER — ONDANSETRON HCL 4 MG PO TABS: 4.0000 mg | ORAL_TABLET | Freq: Every day | ORAL | 0 refills | Status: DC | PRN

## 2016-04-01 NOTE — ED Provider Notes (Signed)
[facility] Emergency Department Provider Note  ____________________________________________  Time seen: Approximately 4:01 PM  I have reviewed the triage vital signs and the nursing notes.   HISTORY  Chief Complaint Generalized Body Aches and Fever    HPI Kristy Kirby is a 41 y.o. female presents to the emergency department with fever, headache, chills, muscle aches, nonproductive cough, nausea, vomiting 3, diarrhea, 2 since last night. Patient is drinking orange juice well but does not have an appetite. No change in urination.Patient is around other sick people at work. Patient has taken ibuprofen for fever. She did not receive flu shot this year. Patient denies shortness of breath, chest pain, abdominal pain.   Past Medical History:  Diagnosis Date  . Anxiety   . Depression   . HTN (hypertension)   . Seizures (HCC)     There are no active problems to display for this patient.   Past Surgical History:  Procedure Laterality Date  . TUBAL LIGATION      Prior to Admission medications   Medication Sig Start Date End Date Taking? Authorizing Provider  FLUoxetine (PROZAC) 40 MG capsule Take 40 mg by mouth daily.   Yes Historical Provider, MD  hydrochlorothiazide (HYDRODIURIL) 25 MG tablet Take 1 tablet (25 mg total) by mouth daily. 01/28/16   Sharman CheekPhillip Stafford, MD  lisinopril (PRINIVIL,ZESTRIL) 20 MG tablet Take 1 tablet (20 mg total) by mouth daily. 01/28/16 01/27/17  Sharman CheekPhillip Stafford, MD  ondansetron (ZOFRAN) 4 MG tablet Take 1 tablet (4 mg total) by mouth daily as needed for nausea or vomiting. 04/01/16 04/01/17  Enid DerryAshley Zelda Reames, PA-C  oseltamivir (TAMIFLU) 75 MG capsule Take 1 capsule (75 mg total) by mouth 2 (two) times daily. 04/01/16   Enid DerryAshley Lucielle Vokes, PA-C    Allergies Banana; Coconut fatty acids; Penicillins; and Sulfa antibiotics  History reviewed. No pertinent family history.  Social History Social History  Substance Use Topics  . Smoking  status: Former Games developermoker  . Smokeless tobacco: Not on file  . Alcohol use No     Review of Systems  Eyes: No visual changes. No discharge. ENT: Negative for congestion and rhinorrhea. Cardiovascular: No chest pain. Respiratory: Positive for cough. No SOB. Gastrointestinal: No abdominal pain.    No constipation. Musculoskeletal: Positive for musculoskeletal pain. Skin: Negative for rash, abrasions, lacerations, ecchymosis. Neurological: Positive for headaches.   ____________________________________________   PHYSICAL EXAM:  VITAL SIGNS: ED Triage Vitals  Enc Vitals Group     BP 04/01/16 1446 (!) 176/111     Pulse Rate 04/01/16 1446 78     Resp 04/01/16 1446 18     Temp 04/01/16 1446 99.4 F (37.4 C)     Temp Source 04/01/16 1446 Oral     SpO2 04/01/16 1446 99 %     Weight 04/01/16 1447 250 lb (113.4 kg)     Height 04/01/16 1447 5\' 1"  (1.549 m)     Head Circumference --      Peak Flow --      Pain Score 04/01/16 1447 8     Pain Loc --      Pain Edu? --      Excl. in GC? --      Constitutional: Alert and oriented. Well appearing and in no acute distress. Eyes: Conjunctivae are normal. PERRL. EOMI. No discharge. Head: Atraumatic. ENT: No frontal and maxillary sinus tenderness.      Ears: Tympanic membranes pearly gray with good landmarks. No discharge.      Nose:  No congestion/rhinnorhea.      Mouth/Throat: Mucous membranes are moist. Oropharynx non-erythematous. Tonsils not enlarged. No exudates. Uvula midline. Neck: No stridor.   Hematological/Lymphatic/Immunilogical: No cervical lymphadenopathy. Cardiovascular: Normal rate, regular rhythm. Good peripheral circulation. Respiratory: Normal respiratory effort without tachypnea or retractions. Lungs CTAB. Good air entry to the bases with no decreased or absent breath sounds. Gastrointestinal: Bowel sounds 4 quadrants. Soft and nontender to palpation. No guarding or rigidity. No palpable masses. No  distention. Musculoskeletal: Full range of motion to all extremities. No gross deformities appreciated. Neurologic:  Normal speech and language. No gross focal neurologic deficits are appreciated.  Skin:  Skin is warm, dry and intact. No rash noted. Psychiatric: Mood and affect are normal. Speech and behavior are normal. Patient exhibits appropriate insight and judgement.   ____________________________________________   LABS (all labs ordered are listed, but only abnormal results are displayed)  Labs Reviewed - No data to display ____________________________________________  EKG   ____________________________________________  RADIOLOGY   No results found.  ____________________________________________    PROCEDURES  Procedure(s) performed:    Procedures    Medications  ondansetron (ZOFRAN) tablet 4 mg (4 mg Oral Given 04/01/16 1554)     ____________________________________________   INITIAL IMPRESSION / ASSESSMENT AND PLAN / ED COURSE  Pertinent labs & imaging results that were available during my care of the patient were reviewed by me and considered in my medical decision making (see chart for details).  Review of the [place] CSRS was performed in accordance of the [facility] prior to dispensing any controlled drugs.     Patient's diagnosis is consistent with Influenza. Vital signs and exam are reassuring. Patient appears well and is staying well-hydrated. Education was provided. Patient is within the window to receive Tamiflu. Patient will be discharged home with prescriptions for Tamiflu. Education about hypertension was provided. She has not yet taken blood pressure medication today and is going to take it to his home. Patient is to follow up with PCP as needed or otherwise directed. Patient is given ED precautions to return to the ED for any worsening or new symptoms.     ____________________________________________  FINAL CLINICAL IMPRESSION(S) / ED  DIAGNOSES  Final diagnoses:  Influenza      NEW MEDICATIONS STARTED DURING THIS VISIT:  Discharge Medication List as of 04/01/2016  4:10 PM    START taking these medications   Details  ondansetron (ZOFRAN) 4 MG tablet Take 1 tablet (4 mg total) by mouth daily as needed for nausea or vomiting., Starting Thu 04/01/2016, Until Fri 04/01/2017, Print    oseltamivir (TAMIFLU) 75 MG capsule Take 1 capsule (75 mg total) by mouth 2 (two) times daily., Starting Thu 04/01/2016, Print            This chart was dictated using voice recognition software/Dragon. Despite best efforts to proofread, errors can occur which can change the meaning. Any change was purely unintentional.    Enid Derry, PA-C 04/01/16 2020    Jennye Moccasin, MD 04/01/16 2252

## 2016-04-01 NOTE — ED Triage Notes (Signed)
States body aches, and fever and cough since last night

## 2016-06-03 ENCOUNTER — Emergency Department
Admission: EM | Admit: 2016-06-03 | Discharge: 2016-06-03 | Disposition: A | Discharge status: Home / Self Care | Payer: Self-pay | Attending: Emergency Medicine | Admitting: Emergency Medicine

## 2016-06-03 ENCOUNTER — Encounter: Payer: Self-pay | Admitting: Emergency Medicine

## 2016-06-03 DIAGNOSIS — F17210 Nicotine dependence, cigarettes, uncomplicated: Secondary | ICD-10-CM | POA: Insufficient documentation

## 2016-06-03 DIAGNOSIS — R569 Unspecified convulsions: Secondary | ICD-10-CM

## 2016-06-03 DIAGNOSIS — G40909 Epilepsy, unspecified, not intractable, without status epilepticus: Secondary | ICD-10-CM | POA: Insufficient documentation

## 2016-06-03 DIAGNOSIS — I10 Essential (primary) hypertension: Secondary | ICD-10-CM | POA: Insufficient documentation

## 2016-06-03 DIAGNOSIS — Z79899 Other long term (current) drug therapy: Secondary | ICD-10-CM | POA: Insufficient documentation

## 2016-06-03 DIAGNOSIS — N390 Urinary tract infection, site not specified: Secondary | ICD-10-CM | POA: Insufficient documentation

## 2016-06-03 LAB — URINALYSIS, ROUTINE W REFLEX MICROSCOPIC
BILIRUBIN URINE: NEGATIVE
Glucose, UA: NEGATIVE mg/dL
Hgb urine dipstick: NEGATIVE
KETONES UR: NEGATIVE mg/dL
Nitrite: NEGATIVE
PROTEIN: 100 mg/dL — AB
SPECIFIC GRAVITY, URINE: 1.031 — AB (ref 1.005–1.030)
pH: 5 (ref 5.0–8.0)

## 2016-06-03 LAB — CBC
HEMATOCRIT: 40.1 % (ref 35.0–47.0)
Hemoglobin: 13.6 g/dL (ref 12.0–16.0)
MCH: 30.8 pg (ref 26.0–34.0)
MCHC: 33.8 g/dL (ref 32.0–36.0)
MCV: 91 fL (ref 80.0–100.0)
PLATELETS: 200 10*3/uL (ref 150–440)
RBC: 4.41 MIL/uL (ref 3.80–5.20)
RDW: 14.7 % — AB (ref 11.5–14.5)
WBC: 9.8 10*3/uL (ref 3.6–11.0)

## 2016-06-03 LAB — COMPREHENSIVE METABOLIC PANEL
ALT: 15 U/L (ref 14–54)
AST: 22 U/L (ref 15–41)
Albumin: 3.7 g/dL (ref 3.5–5.0)
Alkaline Phosphatase: 51 U/L (ref 38–126)
Anion gap: 8 (ref 5–15)
BILIRUBIN TOTAL: 0.6 mg/dL (ref 0.3–1.2)
BUN: 14 mg/dL (ref 6–20)
CO2: 24 mmol/L (ref 22–32)
Calcium: 8.5 mg/dL — ABNORMAL LOW (ref 8.9–10.3)
Chloride: 104 mmol/L (ref 101–111)
Creatinine, Ser: 0.86 mg/dL (ref 0.44–1.00)
GLUCOSE: 92 mg/dL (ref 65–99)
Potassium: 3.1 mmol/L — ABNORMAL LOW (ref 3.5–5.1)
Sodium: 136 mmol/L (ref 135–145)
TOTAL PROTEIN: 6.7 g/dL (ref 6.5–8.1)

## 2016-06-03 LAB — PREGNANCY, URINE: PREG TEST UR: NEGATIVE

## 2016-06-03 MED ORDER — DIPHENHYDRAMINE HCL 50 MG/ML IJ SOLN
12.5000 mg | Freq: Once | INTRAMUSCULAR | Status: AC
Start: 2016-06-03 — End: 2016-06-03
  Administered 2016-06-03: 12.5 mg via INTRAVENOUS
  Filled 2016-06-03: qty 1

## 2016-06-03 MED ORDER — PHENYTOIN SODIUM EXTENDED 100 MG PO CAPS
ORAL_CAPSULE | ORAL | Status: AC
  Filled 2016-06-03: qty 1

## 2016-06-03 MED ORDER — METOCLOPRAMIDE HCL 5 MG/ML IJ SOLN
10.0000 mg | Freq: Once | INTRAMUSCULAR | Status: AC
  Administered 2016-06-03: 10 mg via INTRAVENOUS
  Filled 2016-06-03: qty 2

## 2016-06-03 MED ORDER — PHENYTOIN 50 MG PO CHEW
100.0000 mg | CHEWABLE_TABLET | Freq: Once | ORAL | Status: AC
  Administered 2016-06-03: 100 mg via ORAL
  Filled 2016-06-03: qty 2

## 2016-06-03 MED ORDER — CIPROFLOXACIN HCL 500 MG PO TABS
500.0000 mg | ORAL_TABLET | Freq: Once | ORAL | Status: AC
  Administered 2016-06-03: 500 mg via ORAL
  Filled 2016-06-03: qty 1

## 2016-06-03 MED ORDER — CIPROFLOXACIN HCL 500 MG PO TABS: 500.0000 mg | ORAL_TABLET | Freq: Two times a day (BID) | ORAL | 0 refills | Status: AC

## 2016-06-03 MED ORDER — KETOROLAC TROMETHAMINE 30 MG/ML IJ SOLN
30.0000 mg | Freq: Once | INTRAMUSCULAR | Status: AC
  Administered 2016-06-03: 30 mg via INTRAVENOUS
  Filled 2016-06-03: qty 1

## 2016-06-03 MED ORDER — SODIUM CHLORIDE 0.9 % IV BOLUS (SEPSIS)
1000.0000 mL | Freq: Once | INTRAVENOUS | Status: AC
  Administered 2016-06-03: 1000 mL via INTRAVENOUS

## 2016-06-03 MED ORDER — PHENYTOIN SODIUM EXTENDED 100 MG PO CAPS: 100.0000 mg | ORAL_CAPSULE | Freq: Three times a day (TID) | ORAL | 2 refills | Status: DC

## 2016-06-03 NOTE — ED Triage Notes (Signed)
Patient presents to the ED via EMS from work.  Patient had a grand mal seizure that lasted approx. 2 min. Per EMS.  Patient's co-workers witnessed her seizure and assisted patient to the ground.  Patient reports history of seizures and states she used to take "fenytoin" for her seizures but states she ran out of her medication and does not currently have insurance.  Patient informed regarding medication management clinic.  Patient is complaining of a headache at this time.  Patient reports she hasn't had a seizure in several months.

## 2016-06-03 NOTE — ED Notes (Signed)
ED Provider at bedside. 

## 2016-06-03 NOTE — ED Notes (Signed)
Resumed

## 2016-06-03 NOTE — ED Notes (Signed)
Resumed care anna rn.  Pt alert.  nsr on monitor.  Iv in place.  Labs sent.  meds given.  siderails up x 2.

## 2016-06-03 NOTE — ED Notes (Signed)
Patient reports she has had a lack of sleep lately and denies drinking alcohol.

## 2016-06-03 NOTE — ED Provider Notes (Signed)
[facility] Emergency Department Provider Note  Time seen: 3:20 PM  I have reviewed the triage vital signs and the nursing notes.   HISTORY  Chief Complaint Seizures    HPI Kristy Kirby is a 41 y.o. female With a past medical history of hypertension, anxiety, depression, seizure disorder, who presents to the emergency department after a seizure. According to the patient she was at work when apparently she suffered an approximate 2 minute tonic-clonic seizure according to bystanders. Patient states a history of a seizure disorder, states she was taking phenytoin and following up with [facility] neurology but her insurance ran out one year ago and she has been off her medication since. Patient states her last seizure was approximately 6 months ago. Denies any alcohol use, drug use. Denies any fever chest pain or abdominal pain. Patient states a moderate headache which she states is typical after her seizures. Denies any focal weakness or numbness.  Past Medical History:  Diagnosis Date  . Anxiety   . Depression   . HTN (hypertension)   . Seizures (HCC)     There are no active problems to display for this patient.   Past Surgical History:  Procedure Laterality Date  . TUBAL LIGATION      Prior to Admission medications   Medication Sig Start Date End Date Taking? Authorizing Provider  FLUoxetine (PROZAC) 40 MG capsule Take 40 mg by mouth daily.    Historical Provider, MD  hydrochlorothiazide (HYDRODIURIL) 25 MG tablet Take 1 tablet (25 mg total) by mouth daily. 01/28/16   Sharman Cheek, MD  lisinopril (PRINIVIL,ZESTRIL) 20 MG tablet Take 1 tablet (20 mg total) by mouth daily. 01/28/16 01/27/17  Sharman Cheek, MD  ondansetron (ZOFRAN) 4 MG tablet Take 1 tablet (4 mg total) by mouth daily as needed for nausea or vomiting. 04/01/16 04/01/17  Enid Derry, PA-C  oseltamivir (TAMIFLU) 75 MG capsule Take 1 capsule (75 mg total) by mouth 2 (two) times daily. 04/01/16    Enid Derry, PA-C    Allergies  Allergen Reactions  . Banana   . Coconut Fatty Acids   . Penicillins   . Sulfa Antibiotics     No family history on file.  Social History Social History  Substance Use Topics  . Smoking status: Current Every Day Smoker    Packs/day: 0.50    Types: Cigarettes  . Smokeless tobacco: Never Used  . Alcohol use No    Review of Systems Constitutional: Negative for fever. Cardiovascular: Negative for chest pain. Respiratory: Negative for shortness of breath. Gastrointestinal: Negative for abdominal pain Neurological: moderate headache. Denies focal weakness or numbness. 10-point ROS otherwise negative.  ____________________________________________   PHYSICAL EXAM:  VITAL SIGNS: ED Triage Vitals  Enc Vitals Group     BP 06/03/16 1440 (!) 160/111     Pulse Rate 06/03/16 1440 97     Resp 06/03/16 1440 (!) 21     Temp 06/03/16 1440 98 F (36.7 C)     Temp Source 06/03/16 1440 Oral     SpO2 06/03/16 1440 99 %     Weight 06/03/16 1441 240 lb (108.9 kg)     Height 06/03/16 1441  (1.575 m)     Head Circumference --      Peak Flow --      Pain Score 06/03/16 1440 8     Pain Loc --      Pain Edu? --      Excl. in GC? --  Constitutional: Alert and oriented. Well appearing and in no distress. Eyes: Normal exam ENT   Head: Normocephalic and atraumatic.   Mouth/Throat: Mucous membranes are moist. No trauma noted Cardiovascular: Normal rate, regular rhythm. No murmur Respiratory: Normal respiratory effort without tachypnea nor retractions. Breath sounds are clear and equal bilaterally. No wheezes/rales/rhonchi. Gastrointestinal: Soft and nontender. No distention.   Musculoskeletal: Nontender with normal range of motion in all extremities.  Neurologic:  Normal speech and language. No gross focal neurologic deficits Skin:  Skin is warm, dry and intact.  Psychiatric: Mood and affect are normal.    ____________________________________________    INITIAL IMPRESSION / ASSESSMENT AND PLAN / ED COURSE  Pertinent labs & imaging results that were available during my care of the patient were reviewed by me and considered in my medical decision making (see chart for details).  patient with a known seizure disorder presents to the emergency department after a 2 minute tonic-clonic seizure. Currently the patient appears well, states a moderate headache which she states is typical after her seizures. Patient has been off of her medications for approximately one year due to insurance reasons. I discussed with the patient medication management. We will check labs, close monitoring the patient. We will treat her headache with Toradol, Reglan, Benadryl and IV fluids. We will dose phenytoin in the emergency department and discharged with 100 mg 3 times a day. Patient will follow-up with [facility] neurology.   Patient's labs show a urinary tract infection. We'll place on Keflex. We will discharge with Dilantin 100 mg 3 times a day. Patient will follow up with medication management. Patient continues to appear well. States her headache is nearly gone at this time.  ____________________________________________   FINAL CLINICAL IMPRESSION(S) / ED DIAGNOSES  seizure Urinary tract infection   Minna Antis, MD 06/03/16 1739

## 2016-06-06 LAB — URINE CULTURE

## 2016-06-10 ENCOUNTER — Emergency Department
Admission: EM | Admit: 2016-06-10 | Discharge: 2016-06-10 | Disposition: A | Discharge status: Home / Self Care | Payer: Self-pay | Attending: Student in an Organized Health Care Education/Training Program | Admitting: Student in an Organized Health Care Education/Training Program

## 2016-06-10 ENCOUNTER — Emergency Department: Payer: Self-pay

## 2016-06-10 DIAGNOSIS — F17210 Nicotine dependence, cigarettes, uncomplicated: Secondary | ICD-10-CM | POA: Insufficient documentation

## 2016-06-10 DIAGNOSIS — I10 Essential (primary) hypertension: Secondary | ICD-10-CM | POA: Insufficient documentation

## 2016-06-10 DIAGNOSIS — Z79899 Other long term (current) drug therapy: Secondary | ICD-10-CM | POA: Insufficient documentation

## 2016-06-10 DIAGNOSIS — M25512 Pain in left shoulder: Secondary | ICD-10-CM | POA: Insufficient documentation

## 2016-06-10 MED ORDER — NAPROXEN 500 MG PO TABS: 500.0000 mg | ORAL_TABLET | Freq: Two times a day (BID) | ORAL | 0 refills | Status: DC

## 2016-06-10 MED ORDER — HYDROCODONE-ACETAMINOPHEN 5-325 MG PO TABS: 1.0000 | ORAL_TABLET | ORAL | 0 refills | Status: DC | PRN

## 2016-06-10 MED ORDER — HYDROCODONE-ACETAMINOPHEN 5-325 MG PO TABS
1.0000 | ORAL_TABLET | Freq: Once | ORAL | Status: AC
  Administered 2016-06-10: 1 via ORAL
  Filled 2016-06-10: qty 1

## 2016-06-10 NOTE — ED Notes (Signed)
EDP aware of pt's d/c blood pressure.  Pt has BP medications at home for PM doses, EDP okay with discharging patient at this time.

## 2016-06-10 NOTE — ED Triage Notes (Addendum)
Pt. States that she fell a hurt left shoulder. Son said that she had a seizure a couple of days ago and it happened then. Limit ROM in left arm. Blood pressure elevated. Pt has not taken her meds today. Pt. Alert and speech clear.

## 2016-06-10 NOTE — ED Notes (Signed)
Pt discharged to home.  Family member driving.  Discharge instructions reviewed.  Verbalized understanding.  No questions or concerns at this time.  Teach back verified.  Pt in NAD.  No items left in ED.   

## 2016-06-10 NOTE — ED Provider Notes (Signed)
[facility] Emergency Department Provider Note    First MD Initiated Contact with Patient 06/10/16 2039     (approximate)  I have reviewed the triage vital signs and the nursing notes.   HISTORY  Chief Complaint Shoulder Pain    HPI Kristy Kirby is a 41 y.o. female history of seizure disorder presents with left shoulder pain that occurred after the patient had a seizure 2 days ago. States that the pain has been pretty severe but she was hoping that it would resolve at home. Patient is having difficulty moving left arm due to pain in her shoulder. Currently describes the pain as 10 out of 10 in severity. Has not had any fevers. No redness or warmth to the shoulder. No numbness or tingling. No shortness of breath or chest pain.   Past Medical History:  Diagnosis Date  . Anxiety   . Depression   . HTN (hypertension)   . Seizures (HCC)    History reviewed. No pertinent family history. Past Surgical History:  Procedure Laterality Date  . TUBAL LIGATION     There are no active problems to display for this patient.     Prior to Admission medications   Medication Sig Start Date End Date Taking? Authorizing Provider  FLUoxetine (PROZAC) 40 MG capsule Take 40 mg by mouth daily.    Historical Provider, MD  hydrochlorothiazide (HYDRODIURIL) 25 MG tablet Take 1 tablet (25 mg total) by mouth daily. 01/28/16   Sharman Cheek, MD  lisinopril (PRINIVIL,ZESTRIL) 20 MG tablet Take 1 tablet (20 mg total) by mouth daily. 01/28/16 01/27/17  Sharman Cheek, MD  ondansetron (ZOFRAN) 4 MG tablet Take 1 tablet (4 mg total) by mouth daily as needed for nausea or vomiting. 04/01/16 04/01/17  Enid Derry, PA-C  oseltamivir (TAMIFLU) 75 MG capsule Take 1 capsule (75 mg total) by mouth 2 (two) times daily. 04/01/16   Enid Derry, PA-C  phenytoin (DILANTIN) 100 MG ER capsule Take 1 capsule (100 mg total) by mouth 3 (three) times daily. 06/03/16 06/03/17  Minna Antis,  MD    Allergies Banana; Coconut fatty acids; Penicillins; and Sulfa antibiotics    Social History Social History  Substance Use Topics  . Smoking status: Current Every Day Smoker    Packs/day: 0.50    Types: Cigarettes  . Smokeless tobacco: Never Used  . Alcohol use No    Review of Systems Patient denies headaches, rhinorrhea, blurry vision, numbness, shortness of breath, chest pain, edema, cough, abdominal pain, nausea, vomiting, diarrhea, dysuria, fevers, rashes or hallucinations unless otherwise stated above in HPI. ____________________________________________   PHYSICAL EXAM:  VITAL SIGNS: Vitals:   06/10/16 1914  BP: (!) 192/122  Pulse: 92  Resp: 18  Temp: 99.6 F (37.6 C)    Constitutional: Alert and oriented. Well appearing and in no acute distress. Eyes: Conjunctivae are normal. PERRL. EOMI. Head: Atraumatic. Nose: No congestion/rhinnorhea. Mouth/Throat: Mucous membranes are moist.  Oropharynx non-erythematous. Neck: No stridor. Painless ROM. No cervical spine tenderness to palpation Hematological/Lymphatic/Immunilogical: No cervical lymphadenopathy. Cardiovascular: Normal rate, regular rhythm. Grossly normal heart sounds.  Good peripheral circulation. Respiratory: Normal respiratory effort.  No retractions. Lungs CTAB. Gastrointestinal: Soft and nontender. No distention. No abdominal bruits. No CVA tenderness. Musculoskeletal: No lower extremity tenderness nor edema.  No joint effusions.  ttp along distal aspect of left clavicle, pain with rom but no overlying warmth or erythema.  Neurologic:  Normal speech and language. No gross focal neurologic deficits are appreciated. No gait  instability. Skin:  Skin is warm, dry and intact. No rash noted. Psychiatric: Mood and affect are normal. Speech and behavior are normal.  ____________________________________________   LABS (all labs ordered are listed, but only abnormal results are displayed)  No results  found for this or any previous visit (from the past 24 hour(s)). ____________________________________________ ____________________________________________  RADIOLOGY  I personally reviewed all radiographic images ordered to evaluate for the above acute complaints and reviewed radiology reports and findings.  These findings were personally discussed with the patient.  Please see medical record for radiology report.  ____________________________________________   PROCEDURES  Procedure(s) performed:  Procedures    Critical Care performed: no ____________________________________________   INITIAL IMPRESSION / ASSESSMENT AND PLAN / ED COURSE  Pertinent labs & imaging results that were available during my care of the patient were reviewed by me and considered in my medical decision making (see chart for details).  DDX: fracture, contusion, subluxation, arthrtis,   Kristy Kirby is a 41 y.o. who presents to the ED with left shoulder pain as described above. No evidence of other associated injuries. She is able to move it was some passive motion. Pain seems to be localized to the pectoralis muscle and clavicular acromial joint. There is no evidence of erythema or effusions. No significant pain with internal rotation shoulder. No pain with palpation along the posterior aspect of the shoulder. X-ray shows no evidence of  fracture.  Patient possibly subluxed or dislocated the shoulder causing some injury but based on location of pain I'm more suspicious of a subtle AC injury.  This is not clinically consistent with septic arthritis or bursitis. Patient will be placed in a supportive sling and provided oral pain medications. Discussed signs and symptoms for which she should follow-up with PCP. We'll provide or thigh referral.  Patient was able to tolerate PO and was able to ambulate with a steady gait. Have discussed with the patient and available family all diagnostics and treatments performed  thus far and all questions were answered to the best of my ability. The patient demonstrates understanding and agreement with plan.       ____________________________________________   FINAL CLINICAL IMPRESSION(S) / ED DIAGNOSES  Final diagnoses:  Acute pain of left shoulder      NEW MEDICATIONS STARTED DURING THIS VISIT:  New Prescriptions   No medications on file     Note:  This document was prepared using Dragon voice recognition software and may include unintentional dictation errors.    Willy Eddy, MD 06/10/16 2221

## 2016-07-29 ENCOUNTER — Emergency Department
Admission: EM | Admit: 2016-07-29 | Discharge: 2016-07-29 | Disposition: A | Discharge status: Home / Self Care | Payer: Self-pay | Attending: Emergency Medicine | Admitting: Emergency Medicine

## 2016-07-29 DIAGNOSIS — F17210 Nicotine dependence, cigarettes, uncomplicated: Secondary | ICD-10-CM | POA: Insufficient documentation

## 2016-07-29 DIAGNOSIS — R1110 Vomiting, unspecified: Secondary | ICD-10-CM | POA: Insufficient documentation

## 2016-07-29 DIAGNOSIS — I10 Essential (primary) hypertension: Secondary | ICD-10-CM | POA: Insufficient documentation

## 2016-07-29 DIAGNOSIS — Z79899 Other long term (current) drug therapy: Secondary | ICD-10-CM | POA: Insufficient documentation

## 2016-07-29 DIAGNOSIS — Z5321 Procedure and treatment not carried out due to patient leaving prior to being seen by health care provider: Secondary | ICD-10-CM | POA: Insufficient documentation

## 2016-07-29 DIAGNOSIS — K2900 Acute gastritis without bleeding: Secondary | ICD-10-CM | POA: Insufficient documentation

## 2016-07-29 LAB — CBC
HCT: 43.1 % (ref 35.0–47.0)
Hemoglobin: 14.2 g/dL (ref 12.0–16.0)
MCH: 29.4 pg (ref 26.0–34.0)
MCHC: 32.9 g/dL (ref 32.0–36.0)
MCV: 89.2 fL (ref 80.0–100.0)
PLATELETS: 251 10*3/uL (ref 150–440)
RBC: 4.83 MIL/uL (ref 3.80–5.20)
RDW: 15 % — ABNORMAL HIGH (ref 11.5–14.5)
WBC: 12.6 10*3/uL — ABNORMAL HIGH (ref 3.6–11.0)

## 2016-07-29 LAB — COMPREHENSIVE METABOLIC PANEL
ALK PHOS: 62 U/L (ref 38–126)
ALT: 19 U/L (ref 14–54)
ANION GAP: 8 (ref 5–15)
AST: 34 U/L (ref 15–41)
Albumin: 4 g/dL (ref 3.5–5.0)
BILIRUBIN TOTAL: 0.9 mg/dL (ref 0.3–1.2)
BUN: 13 mg/dL (ref 6–20)
CALCIUM: 9.1 mg/dL (ref 8.9–10.3)
CO2: 24 mmol/L (ref 22–32)
CREATININE: 1.09 mg/dL — AB (ref 0.44–1.00)
Chloride: 108 mmol/L (ref 101–111)
GFR calc non Af Amer: >60 mL/min (ref >60)
GLUCOSE: 142 mg/dL — AB (ref 65–99)
Potassium: 3.2 mmol/L — ABNORMAL LOW (ref 3.5–5.1)
Sodium: 140 mmol/L (ref 135–145)
TOTAL PROTEIN: 7.5 g/dL (ref 6.5–8.1)

## 2016-07-29 LAB — LIPASE, BLOOD: Lipase: 25 U/L (ref 11–51)

## 2016-07-29 MED ORDER — ONDANSETRON 4 MG PO TBDP
4.0000 mg | ORAL_TABLET | Freq: Once | ORAL | Status: AC | PRN
  Administered 2016-07-29: 4 mg via ORAL
  Filled 2016-07-29: qty 1

## 2016-07-29 NOTE — ED Triage Notes (Signed)
Pt states that she has been throwing up for the past 2 days.  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  Pt states that n/v started after this.  Pt a&ox4, ambulatory to triage.  Pt states that she is unable to keep any fluid and food down for the past 2 days.

## 2016-07-29 NOTE — ED Triage Notes (Signed)
Pt was here earlier.  Reporting n/v and abdominal pain.  Pt recently started a new job working nights and has started to drink energy drinks and feels like this could be contributing to symptoms.

## 2016-07-30 ENCOUNTER — Emergency Department
Admission: EM | Admit: 2016-07-30 | Discharge: 2016-07-30 | Disposition: A | Discharge status: Home / Self Care | Payer: Self-pay | Attending: Emergency Medicine | Admitting: Emergency Medicine

## 2016-07-30 DIAGNOSIS — K2900 Acute gastritis without bleeding: Secondary | ICD-10-CM

## 2016-07-30 DIAGNOSIS — R112 Nausea with vomiting, unspecified: Secondary | ICD-10-CM

## 2016-07-30 LAB — URINALYSIS, COMPLETE (UACMP) WITH MICROSCOPIC
Bacteria, UA: NONE SEEN
Bilirubin Urine: NEGATIVE
GLUCOSE, UA: NEGATIVE mg/dL
Hgb urine dipstick: NEGATIVE
Ketones, ur: NEGATIVE mg/dL
Leukocytes, UA: NEGATIVE
NITRITE: NEGATIVE
PH: 5 (ref 5.0–8.0)
Protein, ur: NEGATIVE mg/dL
Specific Gravity, Urine: 1.026 (ref 1.005–1.030)

## 2016-07-30 MED ORDER — ONDANSETRON 4 MG PO TBDP
4.0000 mg | ORAL_TABLET | Freq: Once | ORAL | Status: AC
  Administered 2016-07-30: 4 mg via ORAL
  Filled 2016-07-30: qty 1

## 2016-07-30 MED ORDER — HYDROCHLOROTHIAZIDE 25 MG PO TABS
25.0000 mg | ORAL_TABLET | Freq: Once | ORAL | Status: AC
  Administered 2016-07-30: 25 mg via ORAL
  Filled 2016-07-30: qty 1

## 2016-07-30 MED ORDER — LISINOPRIL 10 MG PO TABS
20.0000 mg | ORAL_TABLET | Freq: Once | ORAL | Status: AC
  Administered 2016-07-30: 20 mg via ORAL
  Filled 2016-07-30: qty 2

## 2016-07-30 MED ORDER — METOPROLOL TARTRATE 25 MG PO TABS
25.0000 mg | ORAL_TABLET | Freq: Once | ORAL | Status: AC
  Administered 2016-07-30: 25 mg via ORAL
  Filled 2016-07-30: qty 1

## 2016-07-30 MED ORDER — ONDANSETRON 4 MG PO TBDP: 4.0000 mg | ORAL_TABLET | Freq: Three times a day (TID) | ORAL | 0 refills | Status: DC | PRN

## 2016-07-30 NOTE — Discharge Instructions (Signed)
Please follow-up with the acute care clinic or your primary care physician. Please return to the emergency department with any worsening condition.

## 2016-07-30 NOTE — ED Provider Notes (Signed)
[facility] Emergency Department Provider Note   ____________________________________________   First MD Initiated Contact with Patient 07/30/16 [phone]     (approximate)  I have reviewed the triage vital signs and the nursing notes.   HISTORY  Chief Complaint Emesis and Abdominal Pain    HPI Kristy Kirby is a 41 y.o. female who comes into the hospital today with some nausea and vomiting as well as some abdominal cramping.The patient reports that she's been going through a lot. She states that her son was murdered a year ago and he had a son born 10 days after his murder. She reports that her grandsons mother left him in the hospital and she has been fighting to get custody of him ever since. She states that she recently started a new job and she works second and third shift. In an effort to stay awake she reports she's been drinking a lot of energy drinks and she thinks that may have caused some of her stomach upset. She reports that last night she was feeling nauseous and vomiting. She could barely keep any fluids down. She reports that she went to her job and they told her to come into the hospital to get checked out to make sure everything was okay. The patient reports that she's had no fevers or diarrhea. She had some cramps in her abdomen. She states she had three-year 4 episodes of nonbilious and nonbloody emesis and her pain in her stomach is about a 3 out of 10 in intensity. After she came here she received some Zofran and reports that she was able to eat a cracker. She is here today for evaluation.   Past Medical History:  Diagnosis Date  . Anxiety   . Depression   . HTN (hypertension)   . Seizures (HCC)     There are no active problems to display for this patient.   Past Surgical History:  Procedure Laterality Date  . TUBAL LIGATION      Prior to Admission medications   Medication Sig Start Date End Date Taking? Authorizing Provider    hydrochlorothiazide (HYDRODIURIL) 25 MG tablet Take 1 tablet (25 mg total) by mouth daily. 01/28/16  Yes Sharman CheekStafford, Phillip, MD  lisinopril (PRINIVIL,ZESTRIL) 20 MG tablet Take 1 tablet (20 mg total) by mouth daily. 01/28/16 01/27/17 Yes Sharman CheekStafford, Phillip, MD  metoprolol tartrate (LOPRESSOR) 25 MG tablet Take 25 mg by mouth 2 (two) times daily.   Yes [provider]  phenytoin (DILANTIN) 100 MG ER capsule Take 1 capsule (100 mg total) by mouth 3 (three) times daily. 06/03/16 06/03/17 Yes Paduchowski, Caryn BeeKevin, MD  FLUoxetine (PROZAC) 40 MG capsule Take 40 mg by mouth daily.    [provider]  HYDROcodone-acetaminophen (NORCO) 5-325 MG tablet Take 1 tablet by mouth every 4 (four) hours as needed for moderate pain. 06/10/16   Willy Eddyobinson, Patrick, MD  naproxen (NAPROSYN) 500 MG tablet Take 1 tablet (500 mg total) by mouth 2 (two) times daily with a meal. 06/10/16 06/10/17  Willy Eddyobinson, Patrick, MD  ondansetron (ZOFRAN ODT) 4 MG disintegrating tablet Take 1 tablet (4 mg total) by mouth every 8 (eight) hours as needed for nausea or vomiting. 07/30/16   Rebecka ApleyWebster, Urania Pearlman P, MD  ondansetron (ZOFRAN) 4 MG tablet Take 1 tablet (4 mg total) by mouth daily as needed for nausea or vomiting. 04/01/16 04/01/17  Enid DerryWagner, Ashley, PA-C  oseltamivir (TAMIFLU) 75 MG capsule Take 1 capsule (75 mg total) by mouth 2 (two) times daily.  04/01/16   Enid Derry, PA-C    Allergies Banana; Coconut fatty acids; Penicillins; and Sulfa antibiotics  No family history on file.  Social History Social History  Substance Use Topics  . Smoking status: Current Every Day Smoker    Packs/day: 0.50    Types: Cigarettes  . Smokeless tobacco: Never Used  . Alcohol use No    Review of Systems  Constitutional: No fever/chills Eyes: No visual changes. ENT: No sore throat. Cardiovascular: Denies chest pain. Respiratory: Denies shortness of breath. Gastrointestinal: abdominal pain, nausea, vomiting.  No diarrhea.  No  constipation. Genitourinary: Negative for dysuria. Musculoskeletal: Negative for back pain. Skin: Negative for rash. Neurological: Negative for headaches, focal weakness or numbness.   ____________________________________________   PHYSICAL EXAM:  VITAL SIGNS: ED Triage Vitals [07/29/16 2356]  Enc Vitals Group     BP (!) 151/106     Pulse Rate 96     Resp 18     Temp 98.8 F (37.1 C)     Temp Source Oral     SpO2 96 %     Weight 240 lb (108.9 kg)     Height 5\' 2"  (1.575 m)     Head Circumference      Peak Flow      Pain Score 6     Pain Loc      Pain Edu?      Excl. in GC?     Constitutional: Alert and oriented. Well appearing and in mild Eyes: Conjunctivae are normal. PERRL. EOMI. Head: Atraumatic. Nose: No congestion/rhinnorhea. Mouth/Throat: Mucous membranes are moist.  Oropharynx non-erythematous. Cardiovascular: Normal rate, regular rhythm. Grossly normal heart sounds.  Good peripheral circulation. Respiratory: Normal respiratory effort.  No retractions. Lungs CTAB. Gastrointestinal: Soft and nontender. No distention. Positive bowel sounds Musculoskeletal: No lower extremity tenderness nor edema.   Neurologic:  Normal speech and language. Positive bowel sounds Skin:  Skin is warm, dry and intact.  Psychiatric: Mood and affect are normal.   ____________________________________________   LABS (all labs ordered are listed, but only abnormal results are displayed)  Labs Reviewed  URINALYSIS, COMPLETE (UACMP) WITH MICROSCOPIC - Abnormal; Notable for the following:       Result Value   Color, Urine YELLOW (*)    APPearance HAZY (*)    Squamous Epithelial / LPF 0-5 (*)    All other components within normal limits   ____________________________________________  EKG  none ____________________________________________  RADIOLOGY  No results found.  ____________________________________________   PROCEDURES  Procedure(s) performed:  None  Procedures  Critical Care performed: No  ____________________________________________   INITIAL IMPRESSION / ASSESSMENT AND PLAN / ED COURSE  Pertinent labs & imaging results that were available during my care of the patient were reviewed by me and considered in my medical decision making (see chart for details).  This is a 41 year old female who comes into the hospital today with some nausea and vomiting. The patient thinks that due to her drinking energy drinks but she did have a urinary tract infection back in April. I will send some urine on the patient. The patient did have a mildly elevated white blood cell count when she was seen initially. I will give the patient some Zofran and check some urine on the patient. She will be reassessed once I received her results. We will also attempt a by mouth trial.     I did give the patient her home blood pressure medicines that she did not take today. The patient receive  metoprolol, lisinopril and hydrochlorothiazide. The patient's urinalysis is unremarkable. I also did look at the patient's blood work from previously. She did have a mildly elevated white blood cell count but the remaining labs are unremarkable. I feel the patient may have some gastritis causing her symptoms. She did receive some Zofran and was able to drink and eat without any vomiting in the emergency department. I will discharge the patient home to have her follow-up with the acute care clinic or Dr. Maryellen Pile. I did have the patient seen by TTS to give her resources regarding her grief and her depression symptoms. The patient will be discharged home to follow-up.  ____________________________________________   FINAL CLINICAL IMPRESSION(S) / ED DIAGNOSES  Final diagnoses:  Nausea and vomiting, intractability of vomiting not specified, unspecified vomiting type  Acute gastritis without hemorrhage, unspecified gastritis type      NEW MEDICATIONS STARTED DURING THIS  VISIT:  New Prescriptions   ONDANSETRON (ZOFRAN ODT) 4 MG DISINTEGRATING TABLET    Take 1 tablet (4 mg total) by mouth every 8 (eight) hours as needed for nausea or vomiting.     Note:  This document was prepared using Dragon voice recognition software and may include unintentional dictation errors.    Rebecka Apley, MD 07/30/16 [phone]

## 2016-11-21 ENCOUNTER — Emergency Department
Admission: EM | Admit: 2016-11-21 | Discharge: 2016-11-21 | Disposition: A | Discharge status: Home / Self Care | Payer: Self-pay | Attending: Emergency Medicine | Admitting: Emergency Medicine

## 2016-11-21 ENCOUNTER — Encounter: Payer: Self-pay | Admitting: Emergency Medicine

## 2016-11-21 DIAGNOSIS — I10 Essential (primary) hypertension: Secondary | ICD-10-CM | POA: Insufficient documentation

## 2016-11-21 DIAGNOSIS — F17210 Nicotine dependence, cigarettes, uncomplicated: Secondary | ICD-10-CM | POA: Insufficient documentation

## 2016-11-21 DIAGNOSIS — M545 Low back pain: Secondary | ICD-10-CM | POA: Insufficient documentation

## 2016-11-21 DIAGNOSIS — M5431 Sciatica, right side: Secondary | ICD-10-CM | POA: Insufficient documentation

## 2016-11-21 DIAGNOSIS — Z79899 Other long term (current) drug therapy: Secondary | ICD-10-CM | POA: Insufficient documentation

## 2016-11-21 MED ORDER — MELOXICAM 15 MG PO TABS: 15.0000 mg | ORAL_TABLET | Freq: Every day | ORAL | 0 refills | Status: DC

## 2016-11-21 MED ORDER — ORPHENADRINE CITRATE 30 MG/ML IJ SOLN
60.0000 mg | Freq: Once | INTRAMUSCULAR | Status: AC
  Administered 2016-11-21: 60 mg via INTRAMUSCULAR
  Filled 2016-11-21: qty 2

## 2016-11-21 MED ORDER — CYCLOBENZAPRINE HCL 10 MG PO TABS: 10.0000 mg | ORAL_TABLET | Freq: Three times a day (TID) | ORAL | 0 refills | Status: DC | PRN

## 2016-11-21 MED ORDER — KETOROLAC TROMETHAMINE 60 MG/2ML IM SOLN
60.0000 mg | Freq: Once | INTRAMUSCULAR | Status: AC
  Administered 2016-11-21: 60 mg via INTRAMUSCULAR
  Filled 2016-11-21: qty 2

## 2016-11-21 NOTE — ED Provider Notes (Signed)
[facility] Emergency Department Provider Note  ____________________________________________  Time seen: Approximately 5:23 PM  I have reviewed the triage vital signs and the nursing notes.   HISTORY  Chief Complaint Back Pain    HPI Kristy Kirby is a 41 y.o. female Who presents emergency department complaining of right-sided sciatica flare. Patient reports that she has intermittent issues with her sciatica. She denies any trauma. He reports that the pain radiates from her lower back into her right lower leg. It does not extend to the foot. No loss of sensation. No bowel or bladder suction, saddle anesthesia, paresthesias. No recent trauma. No urinary symptoms. No other complaints at this time. Patient is here for "medications to get rid of this."   Past Medical History:  Diagnosis Date  . Anxiety   . Depression   . HTN (hypertension)   . Seizures (HCC)     There are no active problems to display for this patient.   Past Surgical History:  Procedure Laterality Date  . TUBAL LIGATION      Prior to Admission medications   Medication Sig Start Date End Date Taking? Authorizing Provider  cyclobenzaprine (FLEXERIL) 10 MG tablet Take 1 tablet (10 mg total) by mouth 3 (three) times daily as needed for muscle spasms. 11/21/16   Ecko Beasley, Delorise Royals, PA-C  FLUoxetine (PROZAC) 40 MG capsule Take 40 mg by mouth daily.    [provider]  hydrochlorothiazide (HYDRODIURIL) 25 MG tablet Take 1 tablet (25 mg total) by mouth daily. 01/28/16   Sharman Cheek, MD  HYDROcodone-acetaminophen (NORCO) 5-325 MG tablet Take 1 tablet by mouth every 4 (four) hours as needed for moderate pain. 06/10/16   Willy Eddy, MD  lisinopril (PRINIVIL,ZESTRIL) 20 MG tablet Take 1 tablet (20 mg total) by mouth daily. 01/28/16 01/27/17  Sharman Cheek, MD  meloxicam (MOBIC) 15 MG tablet Take 1 tablet (15 mg total) by mouth daily. 11/21/16   Jimya Ciani, Delorise Royals, PA-C   metoprolol tartrate (LOPRESSOR) 25 MG tablet Take 25 mg by mouth 2 (two) times daily.    [provider]  naproxen (NAPROSYN) 500 MG tablet Take 1 tablet (500 mg total) by mouth 2 (two) times daily with a meal. 06/10/16 06/10/17  Willy Eddy, MD  ondansetron (ZOFRAN ODT) 4 MG disintegrating tablet Take 1 tablet (4 mg total) by mouth every 8 (eight) hours as needed for nausea or vomiting. 07/30/16   Rebecka Apley, MD  ondansetron (ZOFRAN) 4 MG tablet Take 1 tablet (4 mg total) by mouth daily as needed for nausea or vomiting. 04/01/16 04/01/17  Enid Derry, PA-C  oseltamivir (TAMIFLU) 75 MG capsule Take 1 capsule (75 mg total) by mouth 2 (two) times daily. 04/01/16   Enid Derry, PA-C  phenytoin (DILANTIN) 100 MG ER capsule Take 1 capsule (100 mg total) by mouth 3 (three) times daily. 06/03/16 06/03/17  Minna Antis, MD    Allergies Banana; Coconut fatty acids; Penicillins; and Sulfa antibiotics  No family history on file.  Social History Social History  Substance Use Topics  . Smoking status: Current Every Day Smoker    Packs/day: 0.50    Types: Cigarettes  . Smokeless tobacco: Never Used  . Alcohol use No     Review of Systems  Constitutional: No fever/chills Cardiovascular: no chest pain. Respiratory: no cough. No SOB. Gastrointestinal: No abdominal pain.  No nausea, no vomiting.  No diarrhea.  No constipation. Genitourinary: Negative for dysuria. No hematuria Musculoskeletal: positive for right-sided sciatica. Skin: Negative  for rash, abrasions, lacerations, ecchymosis. Neurological: Negative for headaches, focal weakness or numbness. 10-point ROS otherwise negative.  ____________________________________________   PHYSICAL EXAM:  VITAL SIGNS: ED Triage Vitals  Enc Vitals Group     BP 11/21/16 1653 (!) 188/116     Pulse Rate 11/21/16 1653 92     Resp 11/21/16 1653 20     Temp 11/21/16 1653 98.4 F (36.9 C)     Temp Source 11/21/16 1653 Oral      SpO2 11/21/16 1653 100 %     Weight 11/21/16 1654 250 lb (113.4 kg)     Height 11/21/16 1654  (1.549 m)     Head Circumference --      Peak Flow --      Pain Score 11/21/16 1653 10     Pain Loc --      Pain Edu? --      Excl. in GC? --      Constitutional: Alert and oriented. Well appearing and in no acute distress. Eyes: Conjunctivae are normal. PERRL. EOMI. Head: Atraumatic. Neck: No stridor.    Cardiovascular: Normal rate, regular rhythm. Normal S1 and S2.  Good peripheral circulation. Respiratory: Normal respiratory effort without tachypnea or retractions. Lungs CTAB. Good air entry to the bases with no decreased or absent breath sounds. Gastrointestinal: Bowel sounds 4 quadrants. Soft and nontender to palpation. No guarding or rigidity. No palpable masses. No distention. No CVA tenderness. Musculoskeletal: Full range of motion to all extremities. No gross deformities appreciated.no deformities to spine upon inspection. No midline tenderness. Patient is diffusely tender to palpation throughout the right-sided paraspinal muscle groups. Tender to palpation or right-sided sciatic notch. Negative straight leg raise bilaterally. Dorsalis pedis pulse intact bilateral lower extremity's. Sensation intact and equal bilateral lower extremities. Neurologic:  Normal speech and language. No gross focal neurologic deficits are appreciated.  Skin:  Skin is warm, dry and intact. No rash noted. Psychiatric: Mood and affect are normal. Speech and behavior are normal. Patient exhibits appropriate insight and judgement.   ____________________________________________   LABS (all labs ordered are listed, but only abnormal results are displayed)  Labs Reviewed - No data to display ____________________________________________  EKG   ____________________________________________  RADIOLOGY   No results  found.  ____________________________________________    PROCEDURES  Procedure(s) performed:    Procedures    Medications  ketorolac (TORADOL) injection 60 mg (not administered)  orphenadrine (NORFLEX) injection 60 mg (not administered)     ____________________________________________   INITIAL IMPRESSION / ASSESSMENT AND PLAN / ED COURSE  Pertinent labs & imaging results that were available during my care of the patient were reviewed by me and considered in my medical decision making (see chart for details).  Review of the [place] CSRS was performed in accordance of the [facility] prior to dispensing any controlled drugs.     Patient's diagnosis is consistent with right-sided sciatica. This is a chronic, intermittent issue.o recent trauma. No other concerning symptoms warranting imaging or labs. The patient is tried ibuprofen at home with no relief. She was given a shot of Toradol and muscle relaxer and will be discharged home with meloxicam and Flexeril.. P Patient is to follow up with primary care as needed or otherwise directed. Patient is given ED precautions to return to the ED for any worsening or new symptoms.     ____________________________________________  FINAL CLINICAL IMPRESSION(S) / ED DIAGNOSES  Final diagnoses:  Sciatica of right side      NEW MEDICATIONS STARTED DURING  THIS VISIT:  New Prescriptions   CYCLOBENZAPRINE (FLEXERIL) 10 MG TABLET    Take 1 tablet (10 mg total) by mouth 3 (three) times daily as needed for muscle spasms.   MELOXICAM (MOBIC) 15 MG TABLET    Take 1 tablet (15 mg total) by mouth daily.        This chart was dictated using voice recognition software/Dragon. Despite best efforts to proofread, errors can occur which can change the meaning. Any change was purely unintentional.    Racheal Patches, PA-C 11/21/16 1812    Phineas Semen, MD 11/21/16 [phone]

## 2016-11-21 NOTE — ED Triage Notes (Signed)
Pt comes into the ED via POV c/o right lower back pain that shoots through her buttock and into her leg.  Patient states she has a history of sciatica and it flares up every once in a while.  Patient has been taking OTC ibuprofen with no relief.  Patient in NAD at this time with even and unlabored respirations.

## 2016-12-10 ENCOUNTER — Encounter: Payer: Self-pay | Admitting: Emergency Medicine

## 2016-12-10 ENCOUNTER — Emergency Department: Payer: Self-pay

## 2016-12-10 ENCOUNTER — Emergency Department
Admission: EM | Admit: 2016-12-10 | Discharge: 2016-12-10 | Disposition: A | Discharge status: Home / Self Care | Payer: Self-pay | Attending: Emergency Medicine | Admitting: Emergency Medicine

## 2016-12-10 DIAGNOSIS — Z79899 Other long term (current) drug therapy: Secondary | ICD-10-CM | POA: Insufficient documentation

## 2016-12-10 DIAGNOSIS — G8929 Other chronic pain: Secondary | ICD-10-CM | POA: Insufficient documentation

## 2016-12-10 DIAGNOSIS — I10 Essential (primary) hypertension: Secondary | ICD-10-CM | POA: Insufficient documentation

## 2016-12-10 DIAGNOSIS — F17210 Nicotine dependence, cigarettes, uncomplicated: Secondary | ICD-10-CM | POA: Insufficient documentation

## 2016-12-10 DIAGNOSIS — M25561 Pain in right knee: Secondary | ICD-10-CM | POA: Insufficient documentation

## 2016-12-10 MED ORDER — OXYCODONE-ACETAMINOPHEN 5-325 MG PO TABS
1.0000 | ORAL_TABLET | Freq: Once | ORAL | Status: AC
  Administered 2016-12-10: 1 via ORAL
  Filled 2016-12-10: qty 1

## 2016-12-10 MED ORDER — IBUPROFEN 800 MG PO TABS: 800.0000 mg | ORAL_TABLET | Freq: Three times a day (TID) | ORAL | 0 refills | Status: DC | PRN

## 2016-12-10 MED ORDER — KETOROLAC TROMETHAMINE 30 MG/ML IJ SOLN
30.0000 mg | Freq: Once | INTRAMUSCULAR | Status: AC
  Administered 2016-12-10: 30 mg via INTRAMUSCULAR
  Filled 2016-12-10: qty 1

## 2016-12-10 NOTE — ED Notes (Signed)
Knee immobilizer applied. Crutches given to pt.

## 2016-12-10 NOTE — ED Provider Notes (Signed)
[facility] Emergency Department Provider Note  ____________________________________________  Time seen: Approximately 12:29 PM  I have reviewed the triage vital signs and the nursing notes.   HISTORY  Chief Complaint Knee Pain    HPI Kristy Kirby is a 41 y.o. female that presents to the emergency department for evaluation of right knee pain for one day.  Pain is in the front of her knee and does not radiate. Pain is worse when she bends her knee. She states that she is on her feet a lot at work. She is not sure if she twisted wrong when she was at work. No recent trauma. She dislocated her knee when she was 14 and has had knee pain off and on since.Last time this happened, she placed her knee in a knee immobilizer and used crutches and pain resolved after a couple of days. Patient smokes a pack every 3 days. No fever, calf pain, numbness, tingling.   Past Medical History:  Diagnosis Date  . Anxiety   . Depression   . HTN (hypertension)   . Seizures (HCC)     There are no active problems to display for this patient.   Past Surgical History:  Procedure Laterality Date  . TUBAL LIGATION      Prior to Admission medications   Medication Sig Start Date End Date Taking? Authorizing Provider  cyclobenzaprine (FLEXERIL) 10 MG tablet Take 1 tablet (10 mg total) by mouth 3 (three) times daily as needed for muscle spasms. 11/21/16   Cuthriell, Delorise RoyalsJonathan D, PA-C  FLUoxetine (PROZAC) 40 MG capsule Take 40 mg by mouth daily.    [provider]  hydrochlorothiazide (HYDRODIURIL) 25 MG tablet Take 1 tablet (25 mg total) by mouth daily. 01/28/16   Sharman CheekStafford, Phillip, MD  HYDROcodone-acetaminophen (NORCO) 5-325 MG tablet Take 1 tablet by mouth every 4 (four) hours as needed for moderate pain. 06/10/16   Willy Eddyobinson, Patrick, MD  ibuprofen (ADVIL,MOTRIN) 800 MG tablet Take 1 tablet (800 mg total) by mouth every 8 (eight) hours as needed. 12/10/16   Enid DerryWagner, Ferdinando Lodge,  PA-C  lisinopril (PRINIVIL,ZESTRIL) 20 MG tablet Take 1 tablet (20 mg total) by mouth daily. 01/28/16 01/27/17  Sharman CheekStafford, Phillip, MD  meloxicam (MOBIC) 15 MG tablet Take 1 tablet (15 mg total) by mouth daily. 11/21/16   Cuthriell, Delorise RoyalsJonathan D, PA-C  metoprolol tartrate (LOPRESSOR) 25 MG tablet Take 25 mg by mouth 2 (two) times daily.    [provider]  naproxen (NAPROSYN) 500 MG tablet Take 1 tablet (500 mg total) by mouth 2 (two) times daily with a meal. 06/10/16 06/10/17  Willy Eddyobinson, Patrick, MD  ondansetron (ZOFRAN ODT) 4 MG disintegrating tablet Take 1 tablet (4 mg total) by mouth every 8 (eight) hours as needed for nausea or vomiting. 07/30/16   Rebecka ApleyWebster, Allison P, MD  ondansetron (ZOFRAN) 4 MG tablet Take 1 tablet (4 mg total) by mouth daily as needed for nausea or vomiting. 04/01/16 04/01/17  Enid DerryWagner, Erdem Naas, PA-C  oseltamivir (TAMIFLU) 75 MG capsule Take 1 capsule (75 mg total) by mouth 2 (two) times daily. 04/01/16   Enid DerryWagner, Jerusalem Brownstein, PA-C  phenytoin (DILANTIN) 100 MG ER capsule Take 1 capsule (100 mg total) by mouth 3 (three) times daily. 06/03/16 06/03/17  Minna AntisPaduchowski, Kevin, MD    Allergies Banana; Coconut fatty acids; Penicillins; and Sulfa antibiotics  History reviewed. No pertinent family history.  Social History Social History  Substance Use Topics  . Smoking status: Current Every Day Smoker    Packs/day: 0.50  Types: Cigarettes  . Smokeless tobacco: Never Used  . Alcohol use No     Review of Systems  Constitutional: No fever/chills Cardiovascular: No chest pain. Respiratory: No SOB. Gastrointestinal: No abdominal pain.  No nausea, no vomiting.  Musculoskeletal: Positive for knee pain. Skin: Negative for rash, abrasions, lacerations, ecchymosis. Neurological: Negative for headaches, numbness or tingling   ____________________________________________   PHYSICAL EXAM:  VITAL SIGNS: ED Triage Vitals  Enc Vitals Group     BP 12/10/16 1010 (!) 180/111     Pulse  Rate 12/10/16 1010 91     Resp 12/10/16 1010 18     Temp 12/10/16 1010 98.6 F (37 C)     Temp Source 12/10/16 1010 Oral     SpO2 12/10/16 1010 100 %     Weight 12/10/16 1008 240 lb (108.9 kg)     Height 12/10/16 1008 5\' 2"  (1.575 m)     Head Circumference --      Peak Flow --      Pain Score 12/10/16 1008 8     Pain Loc --      Pain Edu? --      Excl. in GC? --      Constitutional: Alert and oriented. Well appearing and in no acute distress. Eyes: Conjunctivae are normal. PERRL. EOMI. Head: Atraumatic. ENT:      Ears:      Nose: No congestion/rhinnorhea.      Mouth/Throat: Mucous membranes are moist.  Neck: No stridor. Cardiovascular: Normal rate, regular rhythm.  Good peripheral circulation. Respiratory: Normal respiratory effort without tachypnea or retractions. Lungs CTAB. Good air entry to the bases with no decreased or absent breath sounds. Gastrointestinal: Bowel sounds 4 quadrants. Soft and nontender to palpation. No guarding or rigidity. No palpable masses. No distention.  Musculoskeletal: Full range of motion to all extremities. No gross deformities appreciated. No warmth or erythema to the knee. Full range of motion of knee. No visible swelling. Strength 5 out of 5 in lower extremities bilaterally. No calf pain. Neurologic:  Normal speech and language. No gross focal neurologic deficits are appreciated.  Skin:  Skin is warm, dry and intact. No rash noted.   ____________________________________________   LABS (all labs ordered are listed, but only abnormal results are displayed)  Labs Reviewed - No data to display ____________________________________________  EKG   ____________________________________________  RADIOLOGY Lexine Baton, personally viewed and evaluated these images (plain radiographs) as part of my medical decision making, as well as reviewing the written report by the radiologist.  Dg Knee Complete 4 Views Right  Result Date:  12/10/2016 CLINICAL DATA:  Right knee pain on bending, no known injury, initial encounter EXAM: RIGHT KNEE - COMPLETE 4+ VIEW COMPARISON:  01/02/2014 FINDINGS: Very minimal spurring is noted from the medial aspect of the tibia proximally. No acute fracture or dislocation is noted. No joint effusion is seen. IMPRESSION: No acute abnormality noted. Electronically Signed   By: Alcide Clever M.D.   On: 12/10/2016 10:40    ____________________________________________    PROCEDURES  Procedure(s) performed:    Procedures   Medications  oxyCODONE-acetaminophen (PERCOCET/ROXICET) 5-325 MG per tablet 1 tablet (1 tablet Oral Given 12/10/16 1159)  ketorolac (TORADOL) 30 MG/ML injection 30 mg (30 mg Intramuscular Given 12/10/16 1159)     ____________________________________________   INITIAL IMPRESSION / ASSESSMENT AND PLAN / ED COURSE  Pertinent labs & imaging results that were available during my care of the patient were reviewed by me and considered in  my medical decision making (see chart for details).  Review of the [place] CSRS was performed in accordance of the [facility] prior to dispensing any controlled drugs.   Patient presented to the emergency department for evaluation of knee pain. Vital signs and exam are reassuring. X-ray negative for acute bony abnormalities. Pain improved with Percocet and Toradol. Patient will be discharged home with prescriptions for ibuprofen. Patient is to follow up with PCP as directed. Patient is given ED precautions to return to the ED for any worsening or new symptoms.   ____________________________________________  FINAL CLINICAL IMPRESSION(S) / ED DIAGNOSES  Final diagnoses:  Chronic pain of right knee      NEW MEDICATIONS STARTED DURING THIS VISIT:  Discharge Medication List as of 12/10/2016 12:37 PM    START taking these medications   Details  ibuprofen (ADVIL,MOTRIN) 800 MG tablet Take 1 tablet (800 mg total) by mouth every 8 (eight) hours as  needed., Starting Fri 12/10/2016, Print            This chart was dictated using voice recognition software/Dragon. Despite best efforts to proofread, errors can occur which can change the meaning. Any change was purely unintentional.    Enid Derry, PA-C 12/10/16 1406    Merrily Brittle, MD 12/10/16 1459

## 2016-12-10 NOTE — ED Triage Notes (Signed)
Pt c/o right knee pain since yesterday.  Thinks twisted at work, on a feet a lot.  Pain worst when bends knee.  No obvious deformity noted.  BP elevated in triage; pt c/o pain but has not taken bp meds today either because trying to get to ED

## 2017-02-26 ENCOUNTER — Other Ambulatory Visit: Payer: Self-pay

## 2017-02-26 ENCOUNTER — Emergency Department
Admission: EM | Admit: 2017-02-26 | Discharge: 2017-02-26 | Disposition: A | Discharge status: Home / Self Care | Payer: Self-pay | Attending: Emergency Medicine | Admitting: Emergency Medicine

## 2017-02-26 ENCOUNTER — Encounter: Payer: Self-pay | Admitting: Emergency Medicine

## 2017-02-26 DIAGNOSIS — F17210 Nicotine dependence, cigarettes, uncomplicated: Secondary | ICD-10-CM | POA: Insufficient documentation

## 2017-02-26 DIAGNOSIS — Y999 Unspecified external cause status: Secondary | ICD-10-CM | POA: Insufficient documentation

## 2017-02-26 DIAGNOSIS — Y929 Unspecified place or not applicable: Secondary | ICD-10-CM | POA: Insufficient documentation

## 2017-02-26 DIAGNOSIS — Y9389 Activity, other specified: Secondary | ICD-10-CM | POA: Insufficient documentation

## 2017-02-26 DIAGNOSIS — Z79899 Other long term (current) drug therapy: Secondary | ICD-10-CM | POA: Insufficient documentation

## 2017-02-26 DIAGNOSIS — S76311A Strain of muscle, fascia and tendon of the posterior muscle group at thigh level, right thigh, initial encounter: Secondary | ICD-10-CM | POA: Insufficient documentation

## 2017-02-26 DIAGNOSIS — X503XXA Overexertion from repetitive movements, initial encounter: Secondary | ICD-10-CM | POA: Insufficient documentation

## 2017-02-26 MED ORDER — MELOXICAM 7.5 MG PO TABS
15.0000 mg | ORAL_TABLET | Freq: Once | ORAL | Status: AC
  Administered 2017-02-26: 15 mg via ORAL
  Filled 2017-02-26: qty 2

## 2017-02-26 MED ORDER — MELOXICAM 15 MG PO TABS: 15.0000 mg | ORAL_TABLET | Freq: Every day | ORAL | 0 refills | Status: DC

## 2017-02-26 NOTE — ED Triage Notes (Signed)
Pt here for right knee pain since middle of week.  Started new job and on feet a lot. Pain mostly anterior. Intermittent swelling per pt that is worse when on feet a lot. Has used ice. No obvious deformity.  BP elevated in triage but pt has been out of her BP meds.  Denies any and all symptoms of elevated bp including vision changes, CP, SHOB.  Supposed to be on metoprolol and lisinopril.

## 2017-02-26 NOTE — ED Provider Notes (Signed)
[facility] Emergency Department Provider Note  ____________________________________________  Time seen: Approximately 3:35 PM  I have reviewed the triage vital signs and the nursing notes.   HISTORY  Chief Complaint Knee Pain    HPI Kristy Kirby is a 42 y.o. female who presents the emergency department complaining of right knee pain.  Patient reports that she is starting a new job requiring her to walk and be on her feet quite often.  Patient denies any injury precipitating her knee pain.  Patient started with knee pain to the anteromedial aspect of the knee but it is changed to the posterior aspect of the knee.  Initially, patient had difficulty bending her knee due to pain but she states after taking today to rest, she is able to bend her knee appropriately.  Patient denies any gross swelling.  She denies any injury to the hip or ankle.  No other complaints.  She has not tried any medication for this complaint prior to arrival.  Past Medical History:  Diagnosis Date  . Anxiety   . Depression   . HTN (hypertension)   . Seizures (HCC)     There are no active problems to display for this patient.   Past Surgical History:  Procedure Laterality Date  . TUBAL LIGATION      Prior to Admission medications   Medication Sig Start Date End Date Taking? Authorizing Provider  cyclobenzaprine (FLEXERIL) 10 MG tablet Take 1 tablet (10 mg total) by mouth 3 (three) times daily as needed for muscle spasms. 11/21/16   Cuthriell, Delorise RoyalsJonathan D, PA-C  FLUoxetine (PROZAC) 40 MG capsule Take 40 mg by mouth daily.    [provider]  hydrochlorothiazide (HYDRODIURIL) 25 MG tablet Take 1 tablet (25 mg total) by mouth daily. 01/28/16   Sharman CheekStafford, Phillip, MD  HYDROcodone-acetaminophen (NORCO) 5-325 MG tablet Take 1 tablet by mouth every 4 (four) hours as needed for moderate pain. 06/10/16   Willy Eddyobinson, Patrick, MD  ibuprofen (ADVIL,MOTRIN) 800 MG tablet Take 1 tablet (800  mg total) by mouth every 8 (eight) hours as needed. 12/10/16   Enid DerryWagner, Ashley, PA-C  lisinopril (PRINIVIL,ZESTRIL) 20 MG tablet Take 1 tablet (20 mg total) by mouth daily. 01/28/16 01/27/17  Sharman CheekStafford, Phillip, MD  meloxicam (MOBIC) 15 MG tablet Take 1 tablet (15 mg total) by mouth daily. 02/26/17   Cuthriell, Delorise RoyalsJonathan D, PA-C  metoprolol tartrate (LOPRESSOR) 25 MG tablet Take 25 mg by mouth 2 (two) times daily.    [provider]  oseltamivir (TAMIFLU) 75 MG capsule Take 1 capsule (75 mg total) by mouth 2 (two) times daily. 04/01/16   Enid DerryWagner, Ashley, PA-C  phenytoin (DILANTIN) 100 MG ER capsule Take 1 capsule (100 mg total) by mouth 3 (three) times daily. 06/03/16 06/03/17  Minna AntisPaduchowski, Kevin, MD    Allergies Banana; Coconut fatty acids; Penicillins; and Sulfa antibiotics  History reviewed. No pertinent family history.  Social History Social History   Tobacco Use  . Smoking status: Current Every Day Smoker    Packs/day: 0.50    Types: Cigarettes  . Smokeless tobacco: Never Used  Substance Use Topics  . Alcohol use: No  . Drug use: No     Review of Systems  Constitutional: No fever/chills Eyes: No visual changes.  Cardiovascular: no chest pain. Respiratory: no cough. No SOB. Gastrointestinal: No abdominal pain.  No nausea, no vomiting.  Musculoskeletal: Positive for right knee pain Skin: Negative for rash, abrasions, lacerations, ecchymosis. Neurological: Negative for headaches, focal weakness or  numbness. 10-point ROS otherwise negative.  ____________________________________________   PHYSICAL EXAM:  VITAL SIGNS: ED Triage Vitals  Enc Vitals Group     BP 02/26/17 1523 (!) 179/112     Pulse Rate 02/26/17 1523 86     Resp 02/26/17 1523 18     Temp 02/26/17 1523 98.9 F (37.2 C)     Temp Source 02/26/17 1523 Oral     SpO2 02/26/17 1523 100 %     Weight 02/26/17 1522 245 lb (111.1 kg)     Height 02/26/17 1522 5\' 2"  (1.575 m)     Head Circumference --      Peak  Flow --      Pain Score 02/26/17 1521 8     Pain Loc --      Pain Edu? --      Excl. in GC? --      Constitutional: Alert and oriented. Well appearing and in no acute distress. Eyes: Conjunctivae are normal. PERRL. EOMI. Head: Atraumatic. Neck: No stridor.    Cardiovascular: Normal rate, regular rhythm. Normal S1 and S2.  Good peripheral circulation. Respiratory: Normal respiratory effort without tachypnea or retractions. Lungs CTAB. Good air entry to the bases with no decreased or absent breath sounds. Musculoskeletal: Full range of motion to all extremities. No gross deformities appreciated.  No deformity, gross edema noted to the right knee.  Full range of motion.  Patient is tender to palpation along the distal hamstring, but no other tenderness to palpation.  No palpable abnormality or deficits.  Varus, valgus, Lachman's, McMurray's is negative.  Dorsalis pedis pulse intact distally.  Examination of the hip and ankle is unremarkable. Neurologic:  Normal speech and language. No gross focal neurologic deficits are appreciated.  Skin:  Skin is warm, dry and intact. No rash noted. Psychiatric: Mood and affect are normal. Speech and behavior are normal. Patient exhibits appropriate insight and judgement.   ____________________________________________   LABS (all labs ordered are listed, but only abnormal results are displayed)  Labs Reviewed - No data to display ____________________________________________  EKG   ____________________________________________  RADIOLOGY   No results found.  ____________________________________________    PROCEDURES  Procedure(s) performed:    Procedures    Medications - No data to display   ____________________________________________   INITIAL IMPRESSION / ASSESSMENT AND PLAN / ED COURSE  Pertinent labs & imaging results that were available during my care of the patient were reviewed by me and considered in my medical  decision making (see chart for details).  Review of the [place] CSRS was performed in accordance of the [facility] prior to dispensing any controlled drugs.     Patient's diagnosis is consistent with hamstring strain.  Patient is tender to palpation over the distal hamstring.  No indication for labs or imaging at this time.  Differential fracture versus contusion versus strain versus ligament rupture.. Patient will be discharged home with prescriptions for meloxicam.  She is encouraged to use a knee brace for improved symptoms.. Patient is to follow up with orthopedics as needed or otherwise directed. Patient is given ED precautions to return to the ED for any worsening or new symptoms.     ____________________________________________  FINAL CLINICAL IMPRESSION(S) / ED DIAGNOSES  Final diagnoses:  Strain of right hamstring, initial encounter      NEW MEDICATIONS STARTED DURING THIS VISIT:  ED Discharge Orders        Ordered    meloxicam (MOBIC) 15 MG tablet  Daily  02/26/17 1555          This chart was dictated using voice recognition software/Dragon. Despite best efforts to proofread, errors can occur which can change the meaning. Any change was purely unintentional.    Racheal Patches, PA-C 02/26/17 1558    Sharman Cheek, MD 02/26/17 [phone]

## 2017-06-09 ENCOUNTER — Other Ambulatory Visit: Payer: Self-pay

## 2017-06-09 ENCOUNTER — Encounter: Payer: Self-pay | Admitting: Emergency Medicine

## 2017-06-09 ENCOUNTER — Emergency Department
Admission: EM | Admit: 2017-06-09 | Discharge: 2017-06-09 | Disposition: A | Discharge status: Home / Self Care | Payer: BLUE CROSS/BLUE SHIELD | Attending: Emergency Medicine | Admitting: Emergency Medicine

## 2017-06-09 DIAGNOSIS — Z79899 Other long term (current) drug therapy: Secondary | ICD-10-CM | POA: Insufficient documentation

## 2017-06-09 DIAGNOSIS — I10 Essential (primary) hypertension: Secondary | ICD-10-CM | POA: Diagnosis not present

## 2017-06-09 DIAGNOSIS — F17210 Nicotine dependence, cigarettes, uncomplicated: Secondary | ICD-10-CM | POA: Insufficient documentation

## 2017-06-09 DIAGNOSIS — K529 Noninfective gastroenteritis and colitis, unspecified: Secondary | ICD-10-CM | POA: Diagnosis not present

## 2017-06-09 DIAGNOSIS — R1030 Lower abdominal pain, unspecified: Secondary | ICD-10-CM | POA: Diagnosis present

## 2017-06-09 LAB — COMPREHENSIVE METABOLIC PANEL
ALBUMIN: 3.8 g/dL (ref 3.5–5.0)
ALT: 16 U/L (ref 14–54)
AST: 21 U/L (ref 15–41)
Alkaline Phosphatase: 65 U/L (ref 38–126)
Anion gap: 9 (ref 5–15)
BILIRUBIN TOTAL: 1.1 mg/dL (ref 0.3–1.2)
BUN: 13 mg/dL (ref 6–20)
CHLORIDE: 103 mmol/L (ref 101–111)
CO2: 23 mmol/L (ref 22–32)
Calcium: 8.3 mg/dL — ABNORMAL LOW (ref 8.9–10.3)
Creatinine, Ser: 0.84 mg/dL (ref 0.44–1.00)
GFR calc Af Amer: >60 mL/min (ref >60)
GFR calc non Af Amer: >60 mL/min (ref >60)
GLUCOSE: 96 mg/dL (ref 65–99)
POTASSIUM: 3.3 mmol/L — AB (ref 3.5–5.1)
SODIUM: 135 mmol/L (ref 135–145)
Total Protein: 7.3 g/dL (ref 6.5–8.1)

## 2017-06-09 LAB — URINALYSIS, COMPLETE (UACMP) WITH MICROSCOPIC
BACTERIA UA: NONE SEEN
Bilirubin Urine: NEGATIVE
Glucose, UA: NEGATIVE mg/dL
Ketones, ur: 5 mg/dL — AB
Leukocytes, UA: NEGATIVE
Nitrite: NEGATIVE
PROTEIN: NEGATIVE mg/dL
SPECIFIC GRAVITY, URINE: 1.016 (ref 1.005–1.030)
pH: 6 (ref 5.0–8.0)

## 2017-06-09 LAB — PHENYTOIN LEVEL, TOTAL

## 2017-06-09 LAB — CBC
HEMATOCRIT: 42.4 % (ref 35.0–47.0)
Hemoglobin: 14.3 g/dL (ref 12.0–16.0)
MCH: 30.8 pg (ref 26.0–34.0)
MCHC: 33.9 g/dL (ref 32.0–36.0)
MCV: 90.9 fL (ref 80.0–100.0)
Platelets: 181 10*3/uL (ref 150–440)
RBC: 4.66 MIL/uL (ref 3.80–5.20)
RDW: 15.5 % — AB (ref 11.5–14.5)
WBC: 10.4 10*3/uL (ref 3.6–11.0)

## 2017-06-09 LAB — LIPASE, BLOOD: LIPASE: 27 U/L (ref 11–51)

## 2017-06-09 LAB — POCT PREGNANCY, URINE: PREG TEST UR: NEGATIVE

## 2017-06-09 MED ORDER — SODIUM CHLORIDE 0.9 % IV BOLUS
1000.0000 mL | Freq: Once | INTRAVENOUS | Status: AC
  Administered 2017-06-09: 1000 mL via INTRAVENOUS

## 2017-06-09 MED ORDER — ONDANSETRON HCL 4 MG PO TABS: 4.0000 mg | ORAL_TABLET | Freq: Three times a day (TID) | ORAL | 0 refills | Status: DC | PRN

## 2017-06-09 MED ORDER — ONDANSETRON HCL 4 MG/2ML IJ SOLN
4.0000 mg | Freq: Once | INTRAMUSCULAR | Status: AC
  Administered 2017-06-09: 4 mg via INTRAVENOUS
  Filled 2017-06-09: qty 2

## 2017-06-09 MED ORDER — PHENYTOIN SODIUM EXTENDED 100 MG PO CAPS: 100.0000 mg | ORAL_CAPSULE | Freq: Three times a day (TID) | ORAL | 2 refills | Status: DC

## 2017-06-09 NOTE — Discharge Instructions (Addendum)
Drink plenty of fluids, return to the emergency room for any new or worrisome symptoms, do not drink, drive, soaking the tub, or do anything that if interrupted by anesthesia would cause you harm.  Restart your Dilantin as we discussed.

## 2017-06-09 NOTE — ED Provider Notes (Addendum)
[facility] Emergency Department Provider Note  ____________________________________________   I have reviewed the triage vital signs and the nursing notes. Where available I have reviewed prior notes and, if possible and indicated, outside hospital notes.    HISTORY  Chief Complaint Abdominal Pain and Nausea    HPI Kristy Kirby is a 42 y.o. female who is here today for seizures anxiety depression hypertension, states that she may not have taken all of her blood pressure medications yesterday and has not taken any of her Dilantin for several weeks, but she is not here for any lab.  She is here because she has some cramping lower abdominal discomfort associated with 6 or 7 episodes of watery diarrhea and nonbloody nonbilious vomiting today.  She has no fever no chills.  She denies any melena or bright red blood per rectum, symptoms started this a.m. and 3, the pain is a cramping pain relieved by diarrhea.  No recent antibiotics no recent travel.  No other alleviating or aggravating factors, would also like a work note.  No prior treatment for this positive sick contacts   Past Medical History:  Diagnosis Date  . Anxiety   . Depression   . HTN (hypertension)   . Seizures (HCC)     There are no active problems to display for this patient.   Past Surgical History:  Procedure Laterality Date  . TUBAL LIGATION      Prior to Admission medications   Medication Sig Start Date End Date Taking? Authorizing Provider  cyclobenzaprine (FLEXERIL) 10 MG tablet Take 1 tablet (10 mg total) by mouth 3 (three) times daily as needed for muscle spasms. 11/21/16   Cuthriell, Delorise Royals, PA-C  FLUoxetine (PROZAC) 40 MG capsule Take 40 mg by mouth daily.    [provider]  hydrochlorothiazide (HYDRODIURIL) 25 MG tablet Take 1 tablet (25 mg total) by mouth daily. 01/28/16   Sharman Cheek, MD  HYDROcodone-acetaminophen (NORCO) 5-325 MG tablet Take 1 tablet by mouth  every 4 (four) hours as needed for moderate pain. 06/10/16   Willy Eddy, MD  ibuprofen (ADVIL,MOTRIN) 800 MG tablet Take 1 tablet (800 mg total) by mouth every 8 (eight) hours as needed. 12/10/16   Enid Derry, PA-C  lisinopril (PRINIVIL,ZESTRIL) 20 MG tablet Take 1 tablet (20 mg total) by mouth daily. 01/28/16 01/27/17  Sharman Cheek, MD  meloxicam (MOBIC) 15 MG tablet Take 1 tablet (15 mg total) by mouth daily. 02/26/17   Cuthriell, Delorise Royals, PA-C  metoprolol tartrate (LOPRESSOR) 25 MG tablet Take 25 mg by mouth 2 (two) times daily.    [provider]  oseltamivir (TAMIFLU) 75 MG capsule Take 1 capsule (75 mg total) by mouth 2 (two) times daily. 04/01/16   Enid Derry, PA-C  phenytoin (DILANTIN) 100 MG ER capsule Take 1 capsule (100 mg total) by mouth 3 (three) times daily. 06/03/16 06/03/17  Minna Antis, MD    Allergies Banana; Coconut fatty acids; Penicillins; and Sulfa antibiotics  No family history on file.  Social History Social History   Tobacco Use  . Smoking status: Current Every Day Smoker    Packs/day: 0.50    Types: Cigarettes  . Smokeless tobacco: Never Used  Substance Use Topics  . Alcohol use: No  . Drug use: No    Review of Systems Constitutional: No fever/chills Eyes: No visual changes. ENT: No sore throat. No stiff neck no neck pain Cardiovascular: Denies chest pain. Respiratory: Denies shortness of breath. Gastrointestinal: See HPI Genitourinary:  Negative for dysuria. Musculoskeletal: Negative lower extremity swelling Skin: Negative for rash. Neurological: Negative for severe headaches, focal weakness or numbness.   ____________________________________________   PHYSICAL EXAM:  VITAL SIGNS: ED Triage Vitals [06/09/17 1209]  Enc Vitals Group     BP (!) 184/115     Pulse Rate 88     Resp 16     Temp 98.5 F (36.9 C)     Temp Source Oral     SpO2 100 %     Weight 240 lb (108.9 kg)     Height 5\' 2"  (1.575 m)      Head Circumference      Peak Flow      Pain Score 8     Pain Loc      Pain Edu?      Excl. in GC?     Constitutional: Alert and oriented. Well appearing and in no acute distress. Eyes: Conjunctivae are normal Head: Atraumatic HEENT: No congestion/rhinnorhea. Mucous membranes are moist.  Oropharynx non-erythematous Neck:   Nontender with no meningismus, no masses, no stridor Cardiovascular: Normal rate, regular rhythm. Grossly normal heart sounds.  Good peripheral circulation. Respiratory: Normal respiratory effort.  No retractions. Lungs CTAB. Abdominal: Soft and nontender. No distention. No guarding no rebound Back:  There is no focal tenderness or step off.  there is no midline tenderness there are no lesions noted. there is no CVA tenderness Musculoskeletal: No lower extremity tenderness, no upper extremity tenderness. No joint effusions, no DVT signs strong distal pulses no edema Neurologic:  Normal speech and language. No gross focal neurologic deficits are appreciated.  Skin:  Skin is warm, dry and intact. No rash noted. Psychiatric: Mood and affect are normal. Speech and behavior are normal.  ____________________________________________   LABS (all labs ordered are listed, but only abnormal results are displayed)  Labs Reviewed  COMPREHENSIVE METABOLIC PANEL - Abnormal; Notable for the following components:      Result Value   Potassium 3.3 (*)    Calcium 8.3 (*)    All other components within normal limits  CBC - Abnormal; Notable for the following components:   RDW 15.5 (*)    All other components within normal limits  LIPASE, BLOOD  URINALYSIS, COMPLETE (UACMP) WITH MICROSCOPIC  PHENYTOIN LEVEL, TOTAL  POCT PREGNANCY, URINE  POC URINE PREG, ED    Pertinent labs  results that were available during my care of the patient were reviewed by me and considered in my medical decision making (see chart for details). ____________________________________________  EKG  I  personally interpreted any EKGs ordered by me or triage  ____________________________________________  RADIOLOGY  Pertinent labs & imaging results that were available during my care of the patient were reviewed by me and considered in my medical decision making (see chart for details). If possible, patient and/or family made aware of any abnormal findings.  No results found. ____________________________________________    PROCEDURES  Procedure(s) performed: None  Procedures  Critical Care performed: None  ____________________________________________   INITIAL IMPRESSION / ASSESSMENT AND PLAN / ED COURSE  Pertinent labs & imaging results that were available during my care of the patient were reviewed by me and considered in my medical decision making (see chart for details).  Patient here with nausea vomiting and diarrhea, very viral-looking picture clinically, abdomen is benign nontender, blood pressure is noted to be elevated but she was feeling unwell when she came in we will give her fluid and recheck, it is unrelated I think  to her primary complaint, will also check a Dilantin level and if it is negative as anticipated we will restart her Dilantin, patient with abdominal cramping watery diarrhea and vomiting most likely viral, abdomen absolutely nonsurgical.  ----------------------------------------- 4:29 PM on 06/09/2017 -----------------------------------------  Patient remains absolutely asymptomatic at this time abdomen completely benign, blood pressures trending down as her blood pressure kicks in, she feels 100% better wants to go home.  Tolerating p.o.  Her Dilantin level is undetectable as expected.  I did offer to give her IV bolus of Dilantin but that would keep her here for another hour at least and she refuses.  She like to go home.  I did instruct her therefore to to take 300 mg at home when she got home to restart.  She knows she must not drive or climb ladders or  soak in the tub etc. with her seizure disorder.  He states she is not driving home no other acute processes noted and she is ready to leave    ____________________________________________   FINAL CLINICAL IMPRESSION(S) / ED DIAGNOSES  Final diagnoses:  None      This chart was dictated using voice recognition software.  Despite best efforts to proofread,  errors can occur which can change meaning.      Jeanmarie Plant, MD 06/09/17 1450    Jeanmarie Plant, MD 06/09/17 1630

## 2017-06-09 NOTE — ED Triage Notes (Signed)
Pt comes into the ED via POV c/o abdominal pain, N/V/D that started this morning.  Patient states she has vomited x4 this morning and is unable to keep anything down.  Patient is ambulatory to triage with even and unlabored respirations.  Denies any chest pain or shortness of breath associated with the symptoms.  Patient in NAD at this time.

## 2017-06-30 ENCOUNTER — Other Ambulatory Visit: Payer: Self-pay

## 2017-06-30 ENCOUNTER — Encounter: Payer: Self-pay | Admitting: Emergency Medicine

## 2017-06-30 DIAGNOSIS — I10 Essential (primary) hypertension: Secondary | ICD-10-CM | POA: Diagnosis not present

## 2017-06-30 DIAGNOSIS — R51 Headache: Secondary | ICD-10-CM | POA: Diagnosis not present

## 2017-06-30 DIAGNOSIS — F17210 Nicotine dependence, cigarettes, uncomplicated: Secondary | ICD-10-CM | POA: Insufficient documentation

## 2017-06-30 DIAGNOSIS — Z79899 Other long term (current) drug therapy: Secondary | ICD-10-CM | POA: Diagnosis not present

## 2017-06-30 NOTE — ED Triage Notes (Addendum)
Patient ambulatory to triage with steady gait, without difficulty or distress noted; pt reports since yesterday having occipital HA radiating into temples accomp by nausea; denies hx of same but st has been under a lot of stress recently

## 2017-07-01 ENCOUNTER — Emergency Department
Admission: EM | Admit: 2017-07-01 | Discharge: 2017-07-01 | Disposition: A | Discharge status: Home / Self Care | Payer: BLUE CROSS/BLUE SHIELD | Attending: Emergency Medicine | Admitting: Emergency Medicine

## 2017-07-01 DIAGNOSIS — I10 Essential (primary) hypertension: Secondary | ICD-10-CM

## 2017-07-01 DIAGNOSIS — R519 Headache, unspecified: Secondary | ICD-10-CM

## 2017-07-01 DIAGNOSIS — R51 Headache: Secondary | ICD-10-CM

## 2017-07-01 MED ORDER — BUTALBITAL-APAP-CAFFEINE 50-325-40 MG PO TABS
2.0000 | ORAL_TABLET | Freq: Once | ORAL | Status: AC
  Administered 2017-07-01: 2 via ORAL
  Filled 2017-07-01: qty 2

## 2017-07-01 MED ORDER — TRAMADOL HCL 50 MG PO TABS
50.0000 mg | ORAL_TABLET | Freq: Once | ORAL | Status: AC
  Administered 2017-07-01: 50 mg via ORAL
  Filled 2017-07-01: qty 1

## 2017-07-01 MED ORDER — IBUPROFEN 600 MG PO TABS
600.0000 mg | ORAL_TABLET | Freq: Once | ORAL | Status: AC
  Administered 2017-07-01: 600 mg via ORAL
  Filled 2017-07-01: qty 1

## 2017-07-01 NOTE — ED Provider Notes (Signed)
[facility] Emergency Department Provider Note  ____________________________________________   First MD Initiated Contact with Patient 07/01/17 0150     (approximate)  I have reviewed the triage vital signs and the nursing notes.   HISTORY  Chief Complaint Headache    HPI Kristy Kirby is a 42 y.o. female with medical history as listed below who presents for evaluation of headache.  She reports that she does not have chronic headaches or migraines but that she has been under a lot of stress recently and she thinks that is making it worse.  Her headache started gradually about 2 days ago and became severe but right now it is mild.  It feels like it comes from the back of her head and radiates forward and a throbbing and sharp fashion.  It got worse after a close relative recently passed away.  She has had a little bit of nausea but it is resolved.  No visual changes.  She denies neck pain and stiffness.  She has no photophobia and loud noises do not make it worse.  She denies chest pain, shortness of breath, vomiting, and abdominal pain.  She was sleeping comfortably in the exam room when I went to evaluate her.  Past Medical History:  Diagnosis Date  . Anxiety   . Depression   . HTN (hypertension)   . Seizures (HCC)     There are no active problems to display for this patient.   Past Surgical History:  Procedure Laterality Date  . TUBAL LIGATION      Prior to Admission medications   Medication Sig Start Date End Date Taking? Authorizing Provider  cyclobenzaprine (FLEXERIL) 10 MG tablet Take 1 tablet (10 mg total) by mouth 3 (three) times daily as needed for muscle spasms. 11/21/16   Cuthriell, Delorise Royals, PA-C  FLUoxetine (PROZAC) 40 MG capsule Take 40 mg by mouth daily.    [provider]  hydrochlorothiazide (HYDRODIURIL) 25 MG tablet Take 1 tablet (25 mg total) by mouth daily. 01/28/16   Sharman Cheek, MD  HYDROcodone-acetaminophen  (NORCO) 5-325 MG tablet Take 1 tablet by mouth every 4 (four) hours as needed for moderate pain. 06/10/16   Willy Eddy, MD  ibuprofen (ADVIL,MOTRIN) 800 MG tablet Take 1 tablet (800 mg total) by mouth every 8 (eight) hours as needed. 12/10/16   Enid Derry, PA-C  lisinopril (PRINIVIL,ZESTRIL) 20 MG tablet Take 1 tablet (20 mg total) by mouth daily. 01/28/16 01/27/17  Sharman Cheek, MD  meloxicam (MOBIC) 15 MG tablet Take 1 tablet (15 mg total) by mouth daily. 02/26/17   Cuthriell, Delorise Royals, PA-C  metoprolol tartrate (LOPRESSOR) 25 MG tablet Take 25 mg by mouth 2 (two) times daily.    [provider]  ondansetron (ZOFRAN) 4 MG tablet Take 1 tablet (4 mg total) by mouth every 8 (eight) hours as needed for nausea or vomiting. 06/09/17   Jeanmarie Plant, MD  oseltamivir (TAMIFLU) 75 MG capsule Take 1 capsule (75 mg total) by mouth 2 (two) times daily. 04/01/16   Enid Derry, PA-C  phenytoin (DILANTIN) 100 MG ER capsule Take 1 capsule (100 mg total) by mouth 3 (three) times daily. 06/09/17 06/09/18  Jeanmarie Plant, MD    Allergies Banana; Coconut fatty acids; Penicillins; and Sulfa antibiotics  No family history on file.  Social History Social History   Tobacco Use  . Smoking status: Current Every Day Smoker    Packs/day: 0.50    Types: Cigarettes  .  Smokeless tobacco: Never Used  Substance Use Topics  . Alcohol use: No  . Drug use: No    Review of Systems Constitutional: No fever/chills Eyes: No visual changes.  No photophobia ENT: No sore throat. Cardiovascular: Denies chest pain. Respiratory: Denies shortness of breath. Gastrointestinal: No abdominal pain.  Nausea, no vomiting.  No diarrhea.  No constipation. Genitourinary: Negative for dysuria. Musculoskeletal: Negative for neck pain.  Negative for back pain. Integumentary: Negative for rash. Neurological: Generalized headache from the occipital region moving forward for 2 days, gradual in onset as  described above   ____________________________________________   PHYSICAL EXAM:  VITAL SIGNS: ED Triage Vitals  Enc Vitals Group     BP 06/30/17 2246 (!) 199/118     Pulse Rate 06/30/17 2246 89     Resp 06/30/17 2246 16     Temp 06/30/17 2246 98.9 F (37.2 C)     Temp Source 06/30/17 2246 Oral     SpO2 06/30/17 2246 98 %     Weight 06/30/17 2246 112.5 kg (248 lb)     Height 06/30/17 2246 1.575 m ( )     Head Circumference --      Peak Flow --      Pain Score 06/30/17 2256 9     Pain Loc --      Pain Edu? --      Excl. in GC? --     Constitutional: Alert and oriented. Well appearing and in no acute distress. Eyes: Conjunctivae are normal. PERRL. EOMI. Head: Atraumatic. Nose: No congestion/rhinnorhea. Mouth/Throat: Mucous membranes are moist. Neck: No stridor.  No meningeal signs.   Cardiovascular: Normal rate, regular rhythm. Good peripheral circulation. Grossly normal heart sounds. Respiratory: Normal respiratory effort.  No retractions. Lungs CTAB. Gastrointestinal: Soft and nontender. No distention.  Musculoskeletal: No lower extremity tenderness nor edema. No gross deformities of extremities. Neurologic:  Normal speech and language. No gross focal neurologic deficits are appreciated.  Skin:  Skin is warm, dry and intact. No rash noted. Psychiatric: Mood and affect are normal. Speech and behavior are normal.  ____________________________________________   LABS (all labs ordered are listed, but only abnormal results are displayed)  Labs Reviewed - No data to display ____________________________________________  EKG  None - EKG not ordered by ED physician ____________________________________________  RADIOLOGY   ED MD interpretation: No indication for imaging  Official radiology report(s): No results found.  ____________________________________________   PROCEDURES  Critical Care performed: No   Procedure(s) performed:    Procedures   ____________________________________________   INITIAL IMPRESSION / ASSESSMENT AND PLAN / ED COURSE  As part of my medical decision making, I reviewed the following data within the electronic MEDICAL RECORD NUMBER Nursing notes reviewed and incorporated, Old chart reviewed and Notes from prior ED visits    Differential diagnosis includes, but is not limited to, intracranial hemorrhage, meningitis/encephalitis, previous head trauma, cavernous venous thrombosis, tension headache, temporal arteritis, migraine or migraine equivalent, idiopathic intracranial hypertension, and non-specific headache.  however at this point the patient feels much better and is been sleeping comfortably.  We had a discussion about the various types of headaches and she strongly believes that this is result of her stress.  I explained that we could put an IV and give medication such as for migraine but if she rather go home and get some sleep she can certainly do so.  She will first do that since her headache is almost gone.  I gave her 1 tramadol 50 mg by  mouth to help for what she gets home and encouraged her to take ibuprofen and Tylenol and follow-up with her regular doctor.  I gave her a work note for tomorrow because she says she has to be at work in 2 hours.  I gave my usual and customary return precautions.      ____________________________________________  FINAL CLINICAL IMPRESSION(S) / ED DIAGNOSES  Final diagnoses:  Acute nonintractable headache, unspecified headache type  Essential hypertension     MEDICATIONS GIVEN DURING THIS VISIT:  Medications  ibuprofen (ADVIL,MOTRIN) tablet 600 mg (600 mg Oral Given 07/01/17 0115)  butalbital-acetaminophen-caffeine (FIORICET, ESGIC) 50-325-40 MG per tablet 2 tablet (2 tablets Oral Given 07/01/17 0114)  traMADol (ULTRAM) tablet 50 mg (50 mg Oral Given 07/01/17 0207)     ED Discharge Orders    None       Note:  This document was prepared  using Dragon voice recognition software and may include unintentional dictation errors.    Loleta Rose, MD 07/01/17 [phone]

## 2017-07-01 NOTE — Discharge Instructions (Addendum)
You have been seen in the Emergency Department (ED) for a headache.  Please use Tylenol or Motrin as needed for symptoms, but only as written on the box.  As we have discussed, please follow up with your primary care doctor as soon as possible regarding today?s Emergency Department (ED) visit and your headache symptoms.    Please take your blood pressure medication when you get home and as scheduled.  Call your doctor or return to the ED if you have a worsening headache, sudden and severe headache, confusion, slurred speech, facial droop, weakness or numbness in any arm or leg, extreme fatigue, vision problems, or other symptoms that concern you.

## 2017-07-12 ENCOUNTER — Emergency Department
Admission: EM | Admit: 2017-07-12 | Discharge: 2017-07-12 | Disposition: A | Discharge status: Home / Self Care | Payer: BLUE CROSS/BLUE SHIELD | Attending: Emergency Medicine | Admitting: Emergency Medicine

## 2017-07-12 ENCOUNTER — Encounter: Payer: Self-pay | Admitting: Emergency Medicine

## 2017-07-12 DIAGNOSIS — Z79899 Other long term (current) drug therapy: Secondary | ICD-10-CM | POA: Diagnosis not present

## 2017-07-12 DIAGNOSIS — Z760 Encounter for issue of repeat prescription: Secondary | ICD-10-CM | POA: Diagnosis not present

## 2017-07-12 DIAGNOSIS — I10 Essential (primary) hypertension: Secondary | ICD-10-CM | POA: Diagnosis not present

## 2017-07-12 DIAGNOSIS — F17210 Nicotine dependence, cigarettes, uncomplicated: Secondary | ICD-10-CM | POA: Insufficient documentation

## 2017-07-12 LAB — BASIC METABOLIC PANEL
Anion gap: 12 (ref 5–15)
BUN: 11 mg/dL (ref 6–20)
CALCIUM: 8.7 mg/dL — AB (ref 8.9–10.3)
CO2: 18 mmol/L — ABNORMAL LOW (ref 22–32)
Chloride: 108 mmol/L (ref 101–111)
Creatinine, Ser: 0.94 mg/dL (ref 0.44–1.00)
GFR calc Af Amer: >60 mL/min (ref >60)
GLUCOSE: 113 mg/dL — AB (ref 65–99)
Potassium: 3.2 mmol/L — ABNORMAL LOW (ref 3.5–5.1)
Sodium: 138 mmol/L (ref 135–145)

## 2017-07-12 LAB — PHENYTOIN LEVEL, TOTAL: Phenytoin Lvl: <2.5 ug/mL — ABNORMAL LOW (ref 10.0–20.0)

## 2017-07-12 MED ORDER — PHENYTOIN SODIUM EXTENDED 100 MG PO CAPS: 100.0000 mg | ORAL_CAPSULE | Freq: Three times a day (TID) | ORAL | 2 refills | Status: DC

## 2017-07-12 MED ORDER — PHENYTOIN SODIUM EXTENDED 100 MG PO CAPS
100.0000 mg | ORAL_CAPSULE | Freq: Once | ORAL | Status: AC
  Administered 2017-07-12: 100 mg via ORAL
  Filled 2017-07-12 (×2): qty 1

## 2017-07-12 NOTE — ED Notes (Signed)
Called to be taken to a room. No in lobby

## 2017-07-12 NOTE — ED Triage Notes (Signed)
[facility] drawn and they stated her dilantin level was too low and she needed to come in the be evaluated. Pt states her last seizure was 3 days ago. Pt denies any pain and is in NAD in triage. Pt takes  of dilantin daily.

## 2017-07-12 NOTE — ED Provider Notes (Signed)
[facility] Emergency Department Provider Note  ____________________________________________   First MD Initiated Contact with Patient 07/12/17 1520     (approximate)  I have reviewed the triage vital signs and the nursing notes.   HISTORY  Chief Complaint Medication Refill    HPI Kristy Kirby is a 42 y.o. female with medical history as listed below who presents for medication refill.  She reports that she ran out of her Dilantin about a week ago and does not have a primary care doctor to refill it.  She went to the health department and they told her that her Dilantin level was low and she needs to come to the emergency department.  She reports that her last seizure was about 3 days ago but that is essentially normal for her.  Today she has no symptoms.  She denies headache, neck pain, visual changes, fever/chills, chest pain, shortness of breath, nausea, vomiting, and abdominal pain.  She admits she needs to set up a primary care provider.  She says that she typically takes Dilantin 600 mg by mouth daily.  She has no symptoms to quantify in terms of their severity.  Past Medical History:  Diagnosis Date  . Anxiety   . Depression   . HTN (hypertension)   . Seizures (HCC)     There are no active problems to display for this patient.   Past Surgical History:  Procedure Laterality Date  . TUBAL LIGATION      Prior to Admission medications   Medication Sig Start Date End Date Taking? Authorizing Provider  cyclobenzaprine (FLEXERIL) 10 MG tablet Take 1 tablet (10 mg total) by mouth 3 (three) times daily as needed for muscle spasms. 11/21/16   Cuthriell, Delorise Royals, PA-C  FLUoxetine (PROZAC) 40 MG capsule Take 40 mg by mouth daily.    [provider]  hydrochlorothiazide (HYDRODIURIL) 25 MG tablet Take 1 tablet (25 mg total) by mouth daily. 01/28/16   Sharman Cheek, MD  HYDROcodone-acetaminophen (NORCO) 5-325 MG tablet Take 1 tablet by mouth  every 4 (four) hours as needed for moderate pain. 06/10/16   Willy Eddy, MD  ibuprofen (ADVIL,MOTRIN) 800 MG tablet Take 1 tablet (800 mg total) by mouth every 8 (eight) hours as needed. 12/10/16   Enid Derry, PA-C  lisinopril (PRINIVIL,ZESTRIL) 20 MG tablet Take 1 tablet (20 mg total) by mouth daily. 01/28/16 01/27/17  Sharman Cheek, MD  meloxicam (MOBIC) 15 MG tablet Take 1 tablet (15 mg total) by mouth daily. 02/26/17   Cuthriell, Delorise Royals, PA-C  metoprolol tartrate (LOPRESSOR) 25 MG tablet Take 25 mg by mouth 2 (two) times daily.    [provider]  ondansetron (ZOFRAN) 4 MG tablet Take 1 tablet (4 mg total) by mouth every 8 (eight) hours as needed for nausea or vomiting. 06/09/17   Jeanmarie Plant, MD  oseltamivir (TAMIFLU) 75 MG capsule Take 1 capsule (75 mg total) by mouth 2 (two) times daily. 04/01/16   Enid Derry, PA-C  phenytoin (DILANTIN) 100 MG ER capsule Take 1 capsule (100 mg total) by mouth 3 (three) times daily. 07/12/17 07/12/18  Loleta Rose, MD    Allergies Banana; Coconut fatty acids; Penicillins; and Sulfa antibiotics  No family history on file.  Social History Social History   Tobacco Use  . Smoking status: Current Every Day Smoker    Packs/day: 0.50    Types: Cigarettes  . Smokeless tobacco: Never Used  Substance Use Topics  . Alcohol use: No  .  Drug use: No    Review of Systems Constitutional: No fever/chills Eyes: No visual changes. ENT: No sore throat. Cardiovascular: Denies chest pain. Respiratory: Denies shortness of breath. Gastrointestinal: No abdominal pain.  No nausea, no vomiting.  No diarrhea.  No constipation. Genitourinary: Negative for dysuria. Musculoskeletal: Negative for neck pain.  Negative for back pain. Integumentary: Negative for rash. Neurological: Negative for headaches, focal weakness or numbness.  Ran out of seizure medicine.   ____________________________________________   PHYSICAL EXAM:  VITAL  SIGNS: ED Triage Vitals  Enc Vitals Group     BP 07/12/17 1255 (!) 194/125     Pulse Rate 07/12/17 1255 93     Resp 07/12/17 1255 16     Temp 07/12/17 1255 98.5 F (36.9 C)     Temp Source 07/12/17 1255 Oral     SpO2 07/12/17 1255 100 %     Weight 07/12/17 1253 112.5 kg (248 lb)     Height 07/12/17 1253 1.575 m ( )     Head Circumference --      Peak Flow --      Pain Score 07/12/17 1253 0     Pain Loc --      Pain Edu? --      Excl. in GC? --     Constitutional: Alert and oriented. Well appearing and in no acute distress. Eyes: Conjunctivae are normal.  Head: Atraumatic. Nose: No congestion/rhinnorhea. Mouth/Throat: Mucous membranes are moist. Neck: No stridor.  No meningeal signs.   Cardiovascular: Normal rate, regular rhythm. Good peripheral circulation. Grossly normal heart sounds. Respiratory: Normal respiratory effort.  No retractions. Lungs CTAB. Gastrointestinal: Soft and nontender. No distention.  Musculoskeletal: No lower extremity tenderness nor edema. No gross deformities of extremities. Neurologic:  Normal speech and language. No gross focal neurologic deficits are appreciated.  Skin:  Skin is warm, dry and intact. No rash noted. Psychiatric: Mood and affect are normal. Speech and behavior are normal.  ____________________________________________   LABS (all labs ordered are listed, but only abnormal results are displayed)  Labs Reviewed  BASIC METABOLIC PANEL - Abnormal; Notable for the following components:      Result Value   Potassium 3.2 (*)    CO2 18 (*)    Glucose, Bld 113 (*)    Calcium 8.7 (*)    All other components within normal limits  PHENYTOIN LEVEL, TOTAL - Abnormal; Notable for the following components:   Phenytoin Lvl <2.5 (*)    All other components within normal limits   ____________________________________________  EKG  None - EKG not ordered by ED physician ____________________________________________  RADIOLOGY   ED  MD interpretation: No indication for imaging  Official radiology report(s): No results found.  ____________________________________________   PROCEDURES  Critical Care performed: No   Procedure(s) performed:   Procedures   ____________________________________________   INITIAL IMPRESSION / ASSESSMENT AND PLAN / ED COURSE  As part of my medical decision making, I reviewed the following data within the electronic MEDICAL RECORD NUMBER Nursing notes reviewed and incorporated, Labs reviewed , Old chart reviewed and Notes from prior ED visits    No indication of acute or emergent abnormality at this time.  Patient is having no signs or symptoms to which to ascribe a differential diagnosis.  She is out of her medicine but it would be good to know how low her Dilantin level is currently, if it is undetectable or if she still has some in her system.  I will prescribe her medication  but I explained to her how important is that she follow-up with an outpatient provider.  She understands and agrees with the plan.  Clinical Course as of Jul 13 1731  Tue Jul 12, 2017  1657 Undetectable phenytoin level.  I will start her on medication at the initial starting dose which is 100 mg 3 times daily and encourage close outpatient follow-up.  I gave my usual and customary return precautions.   Phenytoin Lvl(!): <2.5 [CF]    Clinical Course User Index [CF] Loleta Rose, MD    ____________________________________________  FINAL CLINICAL IMPRESSION(S) / ED DIAGNOSES  Final diagnoses:  Medication refill     MEDICATIONS GIVEN DURING THIS VISIT:  Medications  phenytoin (DILANTIN) ER capsule 100 mg (100 mg Oral Given 07/12/17 1704)     ED Discharge Orders        Ordered    phenytoin (DILANTIN) 100 MG ER capsule  3 times daily     07/12/17 1732       Note:  This document was prepared using Dragon voice recognition software and may include unintentional dictation errors.    Loleta Rose, MD 07/12/17 [phone]

## 2017-07-12 NOTE — ED Notes (Signed)
Spoke with MD Scotty Court over patient complaints. Verbal order for bmp to be drawn

## 2017-07-12 NOTE — Discharge Instructions (Signed)
If you are no longer a patient at [facility], please call the number provided for the patient navigator to set up a new primary care provider.  It is not a good idea to manage her medications through the emergency department.  We provided another prescription for Dilantin for you, but it is usually the counter medicine and needs to be adjusted over time.  Please follow-up with your regular doctor at the next available opportunity.  Return to the emergency department if you develop new or worsening symptoms that concern you.

## 2017-07-18 ENCOUNTER — Other Ambulatory Visit: Payer: Self-pay

## 2017-07-18 ENCOUNTER — Emergency Department: Payer: BLUE CROSS/BLUE SHIELD

## 2017-07-18 ENCOUNTER — Emergency Department
Admission: EM | Admit: 2017-07-18 | Discharge: 2017-07-18 | Disposition: A | Discharge status: Home / Self Care | Payer: BLUE CROSS/BLUE SHIELD | Attending: Emergency Medicine | Admitting: Emergency Medicine

## 2017-07-18 DIAGNOSIS — S3992XA Unspecified injury of lower back, initial encounter: Secondary | ICD-10-CM | POA: Diagnosis present

## 2017-07-18 DIAGNOSIS — S300XXA Contusion of lower back and pelvis, initial encounter: Secondary | ICD-10-CM | POA: Diagnosis not present

## 2017-07-18 DIAGNOSIS — Y929 Unspecified place or not applicable: Secondary | ICD-10-CM | POA: Insufficient documentation

## 2017-07-18 DIAGNOSIS — F17210 Nicotine dependence, cigarettes, uncomplicated: Secondary | ICD-10-CM | POA: Diagnosis not present

## 2017-07-18 DIAGNOSIS — Y9301 Activity, walking, marching and hiking: Secondary | ICD-10-CM | POA: Diagnosis not present

## 2017-07-18 DIAGNOSIS — W109XXA Fall (on) (from) unspecified stairs and steps, initial encounter: Secondary | ICD-10-CM | POA: Diagnosis not present

## 2017-07-18 DIAGNOSIS — Y999 Unspecified external cause status: Secondary | ICD-10-CM | POA: Diagnosis not present

## 2017-07-18 DIAGNOSIS — S39012A Strain of muscle, fascia and tendon of lower back, initial encounter: Secondary | ICD-10-CM | POA: Diagnosis not present

## 2017-07-18 DIAGNOSIS — I10 Essential (primary) hypertension: Secondary | ICD-10-CM | POA: Insufficient documentation

## 2017-07-18 MED ORDER — NAPROXEN 500 MG PO TABS
500.0000 mg | ORAL_TABLET | Freq: Once | ORAL | Status: AC
  Administered 2017-07-18: 500 mg via ORAL
  Filled 2017-07-18: qty 1

## 2017-07-18 MED ORDER — CYCLOBENZAPRINE HCL 10 MG PO TABS: 10.0000 mg | ORAL_TABLET | Freq: Three times a day (TID) | ORAL | 0 refills | Status: DC | PRN

## 2017-07-18 MED ORDER — CYCLOBENZAPRINE HCL 10 MG PO TABS
10.0000 mg | ORAL_TABLET | Freq: Once | ORAL | Status: AC
  Administered 2017-07-18: 10 mg via ORAL
  Filled 2017-07-18: qty 1

## 2017-07-18 MED ORDER — IBUPROFEN 800 MG PO TABS
800.0000 mg | ORAL_TABLET | Freq: Once | ORAL | Status: AC
  Administered 2017-07-18: 800 mg via ORAL
  Filled 2017-07-18: qty 1

## 2017-07-18 MED ORDER — NAPROXEN 500 MG PO TABS: 500.0000 mg | ORAL_TABLET | Freq: Two times a day (BID) | ORAL | 0 refills | Status: DC

## 2017-07-18 NOTE — ED Notes (Signed)
See triage note  Presents s/p fall yesterday  Landed on tailbone and lower back  Ambulates slowly d/t increased pain

## 2017-07-18 NOTE — ED Triage Notes (Signed)
Patient reports back pain after slipping and falling yesterday.  Reports pain across lower back.

## 2017-07-18 NOTE — ED Provider Notes (Signed)
[facility] Emergency Department Provider Note ____________________________________________  Time seen: Approximately 7:27 AM  I have reviewed the triage vital signs and the nursing notes.  HISTORY  Chief Complaint Back Pain   HPI Kristy Kirby is a 42 y.o. female who presents to the emergency department for treatment and evaluation of lower back pain after fall. She tripped while walking down the stairs and landed on her tailbone and hit her back on the steps as well. No relief with ibuprofen. Pain is worsened by movement and deep breath. She denies bowel or bladder changes, no saddle anesthesia.   Past Medical History:  Diagnosis Date  . Anxiety   . Depression   . HTN (hypertension)   . Seizures (HCC)     There are no active problems to display for this patient.   Past Surgical History:  Procedure Laterality Date  . TUBAL LIGATION      Prior to Admission medications   Medication Sig Start Date End Date Taking? Authorizing Provider  cyclobenzaprine (FLEXERIL) 10 MG tablet Take 1 tablet (10 mg total) by mouth 3 (three) times daily as needed for muscle spasms. 07/18/17   Braiden Presutti B, FNP  FLUoxetine (PROZAC) 40 MG capsule Take 40 mg by mouth daily.    [provider]  hydrochlorothiazide (HYDRODIURIL) 25 MG tablet Take 1 tablet (25 mg total) by mouth daily. 01/28/16   Sharman Cheek, MD  HYDROcodone-acetaminophen (NORCO) 5-325 MG tablet Take 1 tablet by mouth every 4 (four) hours as needed for moderate pain. 06/10/16   Willy Eddy, MD  lisinopril (PRINIVIL,ZESTRIL) 20 MG tablet Take 1 tablet (20 mg total) by mouth daily. 01/28/16 01/27/17  Sharman Cheek, MD  meloxicam (MOBIC) 15 MG tablet Take 1 tablet (15 mg total) by mouth daily. 02/26/17   Cuthriell, Delorise Royals, PA-C  metoprolol tartrate (LOPRESSOR) 25 MG tablet Take 25 mg by mouth 2 (two) times daily.    [provider]  naproxen (NAPROSYN) 500 MG tablet Take 1 tablet  (500 mg total) by mouth 2 (two) times daily with a meal. 07/18/17   Lilana Blasko B, FNP  ondansetron (ZOFRAN) 4 MG tablet Take 1 tablet (4 mg total) by mouth every 8 (eight) hours as needed for nausea or vomiting. 06/09/17   Jeanmarie Plant, MD  oseltamivir (TAMIFLU) 75 MG capsule Take 1 capsule (75 mg total) by mouth 2 (two) times daily. 04/01/16   Enid Derry, PA-C  phenytoin (DILANTIN) 100 MG ER capsule Take 1 capsule (100 mg total) by mouth 3 (three) times daily. 07/12/17 07/12/18  Loleta Rose, MD    Allergies Banana; Coconut fatty acids; Penicillins; and Sulfa antibiotics  No family history on file.  Social History Social History   Tobacco Use  . Smoking status: Current Every Day Smoker    Packs/day: 0.50    Types: Cigarettes  . Smokeless tobacco: Never Used  Substance Use Topics  . Alcohol use: No  . Drug use: No    Review of Systems Constitutional: Well appearing. Respiratory: Negative for dyspnea. Cardiovascular: Negative for change in skin temperature or color. Musculoskeletal:   Negative for chronic steroid use   Negative for trauma in the presence of osteoporosis  Negative for age over 25 and trauma.  Negative for constitutional symptoms, or history of cancer.  Negative for pain worse at night. Skin: Negative for rash, lesion, or wound.  Genitourinary: Negative for urinary retention. Rectal: Negative for fecal incontinence or new onset constipation/bowel habit changes. Hematological/Immunilogical: Negative for  immunosuppression, IV drug use, or fever Neurological: Negative for burning, tingling, numb, electric, radiating pain in the right or left.                        Negative for saddle anesthesia.                        Negative for focal neurologic deficit, progressive or disabling symptoms             Negative for saddle anesthesia. ____________________________________________   PHYSICAL EXAM:  VITAL SIGNS: ED Triage Vitals  Enc Vitals Group     BP  07/18/17 0642 (!) 160/103     Pulse Rate 07/18/17 0642 76     Resp 07/18/17 0642 20     Temp 07/18/17 0642 98.3 F (36.8 C)     Temp Source 07/18/17 0642 Oral     SpO2 07/18/17 0642 97 %     Weight 07/18/17 0639 227 lb (103 kg)     Height --      Head Circumference --      Peak Flow --      Pain Score --      Pain Loc --      Pain Edu? --      Excl. in GC? --     Constitutional: Alert and oriented. Well appearing and in no acute distress. Eyes: Conjunctivae are clear without discharge or drainage.  Head: Atraumatic. Neck: Full, active range of motion. Respiratory: Respirations even and unlabored. Musculoskeletal: Decreased ROM of the back and extremities, Strength 5/5 of the lower extremities as tested. Neurologic: Reflexes of the lower extremities are 2+. Negative straight leg raise on the right or left side. Skin: Atraumatic.  Psychiatric: Behavior and affect are normal.  ____________________________________________   LABS (all labs ordered are listed, but only abnormal results are displayed)  Labs Reviewed - No data to display ____________________________________________  RADIOLOGY  Sacrum and coccyx images are negative for acute bony abnormality per radiology. ____________________________________________   PROCEDURES  Procedure(s) performed:  Procedures ____________________________________________   INITIAL IMPRESSION / ASSESSMENT AND PLAN / ED COURSE  Kristy Kirby is a 42 y.o. female who presents emergency department for treatment and evaluation after mechanical, non-syncopal fall yesterday.  She will be given prescriptions for Flexeril and Naprosyn and encouraged to follow-up with primary care provider for symptoms that are not improving over the next few days.  Work note was provided as well.  She was instructed to return to the emergency department for symptoms of concern if she is unable to see primary care.  Medications  ibuprofen (ADVIL,MOTRIN) tablet  800 mg (800 mg Oral Given 07/18/17 0645)  cyclobenzaprine (FLEXERIL) tablet 10 mg (10 mg Oral Given 07/18/17 0905)  naproxen (NAPROSYN) tablet 500 mg (500 mg Oral Given 07/18/17 0905)    ED Discharge Orders        Ordered    cyclobenzaprine (FLEXERIL) 10 MG tablet  3 times daily PRN     07/18/17 0900    naproxen (NAPROSYN) 500 MG tablet  2 times daily with meals     07/18/17 0900       Pertinent labs & imaging results that were available during my care of the patient were reviewed by me and considered in my medical decision making (see chart for details).  _________________________________________   FINAL CLINICAL IMPRESSION(S) / ED DIAGNOSES  Final diagnoses:  Coccyx contusion, initial encounter  Strain  of lumbar region, initial encounter     If controlled substance prescribed during this visit, 12 month history viewed on the [facility] prior to issuing an initial prescription for Schedule II or III opiod.    Chinita Pesterriplett, Jered Heiny B, FNP 07/18/17 1216    Arnaldo NatalMalinda, Paul F, MD 07/18/17 1515

## 2017-07-19 ENCOUNTER — Encounter: Payer: Self-pay | Admitting: *Deleted

## 2017-07-19 ENCOUNTER — Other Ambulatory Visit: Payer: Self-pay

## 2017-07-19 ENCOUNTER — Emergency Department
Admission: EM | Admit: 2017-07-19 | Discharge: 2017-07-20 | Disposition: A | Discharge status: Home / Self Care | Payer: BLUE CROSS/BLUE SHIELD | Attending: Emergency Medicine | Admitting: Emergency Medicine

## 2017-07-19 DIAGNOSIS — E876 Hypokalemia: Secondary | ICD-10-CM | POA: Diagnosis not present

## 2017-07-19 DIAGNOSIS — Z79899 Other long term (current) drug therapy: Secondary | ICD-10-CM | POA: Diagnosis not present

## 2017-07-19 DIAGNOSIS — R569 Unspecified convulsions: Secondary | ICD-10-CM | POA: Diagnosis not present

## 2017-07-19 DIAGNOSIS — F17210 Nicotine dependence, cigarettes, uncomplicated: Secondary | ICD-10-CM | POA: Insufficient documentation

## 2017-07-19 DIAGNOSIS — I10 Essential (primary) hypertension: Secondary | ICD-10-CM | POA: Diagnosis not present

## 2017-07-19 NOTE — ED Notes (Signed)
NO trauma noted to tongue or inner mouth. No loss of bowel or bladder.

## 2017-07-19 NOTE — ED Triage Notes (Signed)
Pt to ED reporting 5 seizures at home that were witnessed by son. EMS witnessed last seizure that was 1 minute in duration with full body jerking. Pt has a hx of seizures but usually only has 2-3 a day and family was concerned with the continued seizures. Pt is not post ictal upon arrival to ED but is drowsy and falls asleep when RN is not asking questions.   Hx of brain tumor and HTN. EMS reports HTN on scene of 210/140. Pt reports having been taking medication as prescribed. Pt currently on  Phenytoin 100mg  TID Cyclobenzaprine 10mg  TID Naproxen 500mg  BID

## 2017-07-20 ENCOUNTER — Emergency Department: Payer: BLUE CROSS/BLUE SHIELD

## 2017-07-20 LAB — CBC WITH DIFFERENTIAL/PLATELET
BASOS ABS: 0.1 10*3/uL (ref 0–0.1)
BASOS PCT: 1 %
EOS PCT: 1 %
Eosinophils Absolute: 0 10*3/uL (ref 0–0.7)
HCT: 42.6 % (ref 35.0–47.0)
Hemoglobin: 14.9 g/dL (ref 12.0–16.0)
LYMPHS PCT: 25 %
Lymphs Abs: 2.4 10*3/uL (ref 1.0–3.6)
MCH: 31.4 pg (ref 26.0–34.0)
MCHC: 34.9 g/dL (ref 32.0–36.0)
MCV: 90.2 fL (ref 80.0–100.0)
MONO ABS: 0.6 10*3/uL (ref 0.2–0.9)
Monocytes Relative: 6 %
Neutro Abs: 6.5 10*3/uL (ref 1.4–6.5)
Neutrophils Relative %: 67 %
PLATELETS: 196 10*3/uL (ref 150–440)
RBC: 4.73 MIL/uL (ref 3.80–5.20)
RDW: 15.2 % — AB (ref 11.5–14.5)
WBC: 9.6 10*3/uL (ref 3.6–11.0)

## 2017-07-20 LAB — COMPREHENSIVE METABOLIC PANEL
ALBUMIN: 3.9 g/dL (ref 3.5–5.0)
ALT: 25 U/L (ref 14–54)
AST: 26 U/L (ref 15–41)
Alkaline Phosphatase: 70 U/L (ref 38–126)
Anion gap: 11 (ref 5–15)
BUN: 15 mg/dL (ref 6–20)
CHLORIDE: 106 mmol/L (ref 101–111)
CO2: 25 mmol/L (ref 22–32)
CREATININE: 1.01 mg/dL — AB (ref 0.44–1.00)
Calcium: 8.7 mg/dL — ABNORMAL LOW (ref 8.9–10.3)
GFR calc Af Amer: >60 mL/min (ref >60)
Glucose, Bld: 120 mg/dL — ABNORMAL HIGH (ref 65–99)
POTASSIUM: 3.1 mmol/L — AB (ref 3.5–5.1)
SODIUM: 142 mmol/L (ref 135–145)
Total Bilirubin: 0.6 mg/dL (ref 0.3–1.2)
Total Protein: 7 g/dL (ref 6.5–8.1)

## 2017-07-20 LAB — ETHANOL

## 2017-07-20 LAB — PHENYTOIN LEVEL, TOTAL: PHENYTOIN LVL: 2.8 ug/mL — AB (ref 10.0–20.0)

## 2017-07-20 LAB — ACETAMINOPHEN LEVEL: Acetaminophen (Tylenol), Serum: <10 ug/mL — ABNORMAL LOW (ref 10–30)

## 2017-07-20 LAB — LACTIC ACID, PLASMA: LACTIC ACID, VENOUS: 1.1 mmol/L (ref 0.5–1.9)

## 2017-07-20 LAB — MAGNESIUM: MAGNESIUM: 2.2 mg/dL (ref 1.7–2.4)

## 2017-07-20 LAB — SALICYLATE LEVEL: Salicylate Lvl: <7 mg/dL (ref 2.8–30.0)

## 2017-07-20 MED ORDER — PHENYTOIN SODIUM EXTENDED 100 MG PO CAPS
ORAL_CAPSULE | ORAL | Status: AC
  Filled 2017-07-20: qty 3

## 2017-07-20 MED ORDER — LEVETIRACETAM IN NACL 1000 MG/100ML IV SOLN
1000.0000 mg | INTRAVENOUS | Status: AC
  Administered 2017-07-20: 1000 mg via INTRAVENOUS
  Filled 2017-07-20: qty 100

## 2017-07-20 MED ORDER — SODIUM CHLORIDE 0.9 % IV SOLN
20.0000 mg/kg | Freq: Once | INTRAVENOUS | Status: DC
  Filled 2017-07-20: qty 41.2

## 2017-07-20 MED ORDER — PHENYTOIN SODIUM EXTENDED 100 MG PO CAPS
300.0000 mg | ORAL_CAPSULE | Freq: Once | ORAL | Status: AC
  Administered 2017-07-20: 300 mg via ORAL

## 2017-07-20 MED ORDER — IOHEXOL 300 MG/ML  SOLN
75.0000 mL | Freq: Once | INTRAMUSCULAR | Status: AC | PRN
  Administered 2017-07-20: 75 mL via INTRAVENOUS

## 2017-07-20 MED ORDER — PHENYTOIN SODIUM EXTENDED 100 MG PO CAPS: 200.0000 mg | ORAL_CAPSULE | Freq: Three times a day (TID) | ORAL | 3 refills | Status: DC

## 2017-07-20 MED ORDER — POTASSIUM CHLORIDE CRYS ER 20 MEQ PO TBCR: 20.0000 meq | EXTENDED_RELEASE_TABLET | Freq: Every day | ORAL | 0 refills | Status: DC

## 2017-07-20 MED ORDER — LEVETIRACETAM 500 MG PO TABS: 500.0000 mg | ORAL_TABLET | Freq: Two times a day (BID) | ORAL | 0 refills | Status: DC

## 2017-07-20 MED ORDER — POTASSIUM CHLORIDE CRYS ER 20 MEQ PO TBCR
40.0000 meq | EXTENDED_RELEASE_TABLET | Freq: Once | ORAL | Status: AC
  Administered 2017-07-20: 40 meq via ORAL
  Filled 2017-07-20: qty 2

## 2017-07-20 NOTE — ED Notes (Signed)
RN attempted to start phenytoin in IV and pt reported immediate pain. IV is intact. RN suggests we start another IV to administer the drugs in a larger IV. PT refusing at this time and reporting, "I do not want anything else put in me." RN explained the need for the medication and pt ignored nurse and turned head. Pt no longer talking to nurse. RN asked, "what if you had another seizure?" Pt did not answer and continued to look at the wall opposite this nurse. MD made aware.

## 2017-07-20 NOTE — ED Notes (Signed)
Pt remains alert and oriented. Drowsy currently but easily arousal. Family has left bedside. Door is open and pt has pads on bed rails.

## 2017-07-20 NOTE — ED Provider Notes (Signed)
[facility] Emergency Department Provider Note  ____________________________________________   First MD Initiated Contact with Patient 07/19/17 2343     (approximate)  I have reviewed the triage vital signs and the nursing notes.   HISTORY  Chief Complaint Seizures    HPI Kristy Kirby is a 42 y.o. female with medical history as listed below who presents by EMS for evaluation of seizures.  She has been seen here in the past by me because of running out of her Dilantin and I refilled her Dilantin prescription within the last couple of weeks.  She states that she has been taking her medication as prescribed, and her son confirms this.  The medication consists of phenytoin 100 mg 3 times daily.  She reports that she has not yet been able to establish a primary care provider or neurologist as was the plan over the last few weeks and even months when she has been seen previously for seizures and occasion.  She reports that she has been in her usual state of health.  She denies headache, neck pain, fever/chills, chest pain, shortness of breath, cough, nausea, vomiting, abdominal pain, and dysuria.  She reports that she was sitting down so I am starting to feel very hot and flushed and asked her son to get a fan out to cool her down and that she does not know symptoms she was on the ambulance.  Her son reports that she had multiple generalized tonic-clonic seizure episodes that were back to back, each 1 lasting a minute or 2 but then she would stop and then start back up again, not returning to baseline.  He reports that she vomited and was choking at one point on her vomit.  He does not believe she sustained any injuries.  When EMS arrived her blood pressure was reportedly quite elevated in the range of 210/140 but her current blood pressure is come down substantially.  She does have a well-documented history of hypertension, both [facility] and in  Care[facility]where.    She does not appear to have sustained any injuries and she sleepy but otherwise oriented.  Past Medical History:  Diagnosis Date  . Anxiety   . Depression   . HTN (hypertension)   . Seizures (HCC)     There are no active problems to display for this patient.   Past Surgical History:  Procedure Laterality Date  . TUBAL LIGATION      Prior to Admission medications   Medication Sig Start Date End Date Taking? Authorizing Provider  cyclobenzaprine (FLEXERIL) 10 MG tablet Take 1 tablet (10 mg total) by mouth 3 (three) times daily as needed for muscle spasms. 07/18/17   Triplett, Cari B, FNP  FLUoxetine (PROZAC) 40 MG capsule Take 40 mg by mouth daily.    [provider]  hydrochlorothiazide (HYDRODIURIL) 25 MG tablet Take 1 tablet (25 mg total) by mouth daily. 01/28/16   Sharman CheekStafford, Phillip, MD  HYDROcodone-acetaminophen (NORCO) 5-325 MG tablet Take 1 tablet by mouth every 4 (four) hours as needed for moderate pain. 06/10/16   Willy Eddyobinson, Patrick, MD  levETIRAcetam (KEPPRA) 500 MG tablet Take 1 tablet (500 mg total) by mouth 2 (two) times daily. 07/20/17 08/19/17  Loleta RoseForbach, Chrisean Kloth, MD  lisinopril (PRINIVIL,ZESTRIL) 20 MG tablet Take 1 tablet (20 mg total) by mouth daily. 01/28/16 01/27/17  Sharman CheekStafford, Phillip, MD  meloxicam (MOBIC) 15 MG tablet Take 1 tablet (15 mg total) by mouth daily. 02/26/17   Cuthriell, Christiane HaJonathan  D, PA-C  metoprolol tartrate (LOPRESSOR) 25 MG tablet Take 25 mg by mouth 2 (two) times daily.    [provider]  naproxen (NAPROSYN) 500 MG tablet Take 1 tablet (500 mg total) by mouth 2 (two) times daily with a meal. 07/18/17   Triplett, Cari B, FNP  ondansetron (ZOFRAN) 4 MG tablet Take 1 tablet (4 mg total) by mouth every 8 (eight) hours as needed for nausea or vomiting. 06/09/17   Jeanmarie Plant, MD  oseltamivir (TAMIFLU) 75 MG capsule Take 1 capsule (75 mg total) by mouth 2 (two) times daily. 04/01/16   Enid Derry, PA-C  phenytoin (DILANTIN) 100  MG ER capsule Take 2 capsules (200 mg total) by mouth 3 (three) times daily. 07/20/17 07/20/18  Loleta Rose, MD  potassium chloride SA (KLOR-CON M20) 20 MEQ tablet Take 1 tablet (20 mEq total) by mouth daily. 07/20/17   Loleta Rose, MD    Allergies Banana; Coconut fatty acids; Penicillins; and Sulfa antibiotics  History reviewed. No pertinent family history.  Social History Social History   Tobacco Use  . Smoking status: Current Every Day Smoker    Packs/day: 0.50    Types: Cigarettes  . Smokeless tobacco: Never Used  Substance Use Topics  . Alcohol use: No  . Drug use: No    Review of Systems Constitutional: No fever/chills Eyes: No visual changes. ENT: No sore throat. Cardiovascular: Denies chest pain. Respiratory: Denies shortness of breath. Gastrointestinal: No abdominal pain.  No nausea, no vomiting.  No diarrhea.  No constipation. Genitourinary: Negative for dysuria. Musculoskeletal: Negative for neck pain.  Negative for back pain. Integumentary: Negative for rash. Neurological: Recurrent and repeated seizures as described above.  Negative for headaches, focal weakness or numbness.   ____________________________________________   PHYSICAL EXAM:  VITAL SIGNS: ED Triage Vitals  Enc Vitals Group     BP 07/19/17 2350 (!) 159/92     Pulse Rate 07/19/17 2350 81     Resp 07/19/17 2350 (!) 24     Temp 07/19/17 2350 98.1 F (36.7 C)     Temp Source 07/19/17 2350 Oral     SpO2 07/19/17 2350 96 %     Weight 07/19/17 2352 103 kg (227 lb)     Height 07/19/17 2352 1.575 m (5\' 2" )     Head Circumference --      Peak Flow --      Pain Score 07/19/17 2352 0     Pain Loc --      Pain Edu? --      Excl. in [place]? --     Constitutional: Alert and oriented. Well appearing and in no acute distress. Eyes: Conjunctivae are normal. PERRL. EOMI. Head: Atraumatic. Nose: No congestion/rhinnorhea. Mouth/Throat: Mucous membranes are moist. Neck: No stridor.  No meningeal signs.     Cardiovascular: Normal rate, regular rhythm. Good peripheral circulation. Grossly normal heart sounds. Respiratory: Normal respiratory effort.  No retractions. Lungs CTAB. Gastrointestinal: Soft and nontender. No distention.  Musculoskeletal: No lower extremity tenderness nor edema. No gross deformities of extremities. Neurologic:  Normal speech and language. No gross focal neurologic deficits are appreciated.  Skin:  Skin is warm, dry and intact. No rash noted. Psychiatric: Mood and affect are normal. Speech and behavior are normal.  ____________________________________________   LABS (all labs ordered are listed, but only abnormal results are displayed)  Labs Reviewed  CBC WITH DIFFERENTIAL/PLATELET - Abnormal; Notable for the following components:      Result Value   RDW  15.2 (*)    All other components within normal limits  COMPREHENSIVE METABOLIC PANEL - Abnormal; Notable for the following components:   Potassium 3.1 (*)    Glucose, Bld 120 (*)    Creatinine, Ser 1.01 (*)    Calcium 8.7 (*)    All other components within normal limits  ACETAMINOPHEN LEVEL - Abnormal; Notable for the following components:   Acetaminophen (Tylenol), Serum <10 (*)    All other components within normal limits  PHENYTOIN LEVEL, TOTAL - Abnormal; Notable for the following components:   Phenytoin Lvl 2.8 (*)    All other components within normal limits  MAGNESIUM  SALICYLATE LEVEL  ETHANOL  LACTIC ACID, PLASMA  URINE DRUG SCREEN, QUALITATIVE ([facility] ONLY)  URINALYSIS, ROUTINE W REFLEX MICROSCOPIC  CBG MONITORING, ED  POC URINE PREG, ED   ____________________________________________  EKG  None - EKG not ordered by ED physician ____________________________________________  RADIOLOGY   ED MD interpretation:  No acute abnormalities  Official radiology report(s): Ct Head W & Wo Contrast  Result Date: 07/20/2017 CLINICAL DATA:  Seizure. EXAM: CT HEAD WITHOUT AND WITH CONTRAST TECHNIQUE:  Contiguous axial images were obtained from the base of the skull through the vertex without and with intravenous contrast CONTRAST:  75mL OMNIPAQUE IOHEXOL 300 MG/ML  SOLN COMPARISON:  11/18/2004 FINDINGS: Brain: There is no mass, hemorrhage or extra-axial collection. The size and configuration of the ventricles and extra-axial CSF spaces are normal. There is no acute or chronic infarction. The brain parenchyma is normal. Vascular: No abnormal hyperdensity of the major intracranial arteries or dural venous sinuses. No intracranial atherosclerosis. Skull: The visualized skull base, calvarium and extracranial soft tissues are normal. Sinuses/Orbits: No fluid levels or advanced mucosal thickening of the visualized paranasal sinuses. No mastoid or middle ear effusion. The orbits are normal. IMPRESSION: Normal head CT. Electronically Signed   By: Deatra Robinson M.D.   On: 07/20/2017 02:01    ____________________________________________   PROCEDURES  Critical Care performed: No   Procedure(s) performed:   Procedures   ____________________________________________   INITIAL IMPRESSION / ASSESSMENT AND PLAN / ED COURSE  As part of my medical decision making, I reviewed the following data within the electronic MEDICAL RECORD NUMBER History obtained from family, Nursing notes reviewed and incorporated, Labs reviewed , Old chart reviewed and Notes from prior ED visits    Differential diagnosis includes, but is not limited to, medication noncompliance leading to persistent seizures, status epilepticus, acute intracranial injury including bleeding or CVA, acute infection, pseudoseizures or nonepileptic form seizure-like activity.  The patient and her son reports that she has been compliant with her Dilantin level and I am checking a broad spectrum of lab work including her phenytoin level and a lactic acid to see if there is any evidence that she has been seizing recurrently over at least 10 minutes as per  the report.  I am giving a loading dose of Keppra 1 g IV.  She may benefit to switching to Keppra.  Because of the seizures and the family report of "brain tumor", I will check a head CT with and without contrast because she has not had a head CT (at least with the [facility] system) since 2006, and that scan was unremarkable.  Seizure precautions are in place and I will reassess after the results as described above.  Clinical Course as of Jul 20 316  Wed Jul 20, 2017  0059 Lab work is generally reassuring.  Magnesium is normal, potassium slightly low at  3.1, CBC is normal.   [CF]  0059 Alcohol, Ethyl (B): <10 [CF]  0117 It is clear that the patient has been taking phenytoin since the level is now 2.8, but it is still very much subtherapeutic and she has been taking it for at least a week.  Will provide a loading dose of 20 mg/kg and attempt to get her therapeutic since she continues to have seizure-like activity.  Phenytoin Lvl(!): 2.8 [CF]  0245 Normal lactic acid; question whether this may suggest non-epileptiform seizure-like activity.  Lactic Acid, Venous: 1.1 [CF]  0245 Normal w & w/o contrast head CT  CT HEAD W & WO CONTRAST [CF]  0245 Patient refused the IV loading dose of phenytoin that I ordered.   [CF]  0246   Since she does not want any additional IV treatment I am giving her an oral loading dose of 300 mg.   [CF]  0310 Patient is awake and alert although she says she remains sleepy.  She has been observed in the emergency doctor about why I wanted to them but she we decided that I will increase her oral Dilantin and she is however, I explained to her that there is no significant lab work and asked her seizures and she confirmed that her seizures seem to she is under stress or emotional upset.  She will definitely benefit from neurology for further evaluation and to help identify epileptiform versus non-epileptiform seizure-like activity.  I wrote her the prescriptions and gave my usual  customary return precautions.  She understands and agrees with the plan.   [CF]    Clinical Course User Index [CF] Loleta Rose, MD    ____________________________________________  FINAL CLINICAL IMPRESSION(S) / ED DIAGNOSES  Final diagnoses:  Seizure (HCC)  Hypokalemia     MEDICATIONS GIVEN DURING THIS VISIT:  Medications  levETIRAcetam (KEPPRA) IVPB 1000 mg/100 mL premix (0 mg Intravenous Stopped 07/20/17 0120)  iohexol (OMNIPAQUE) 300 MG/ML solution 75 mL (75 mLs Intravenous Contrast Given 07/20/17 0122)  potassium chloride SA (K-DUR,KLOR-CON) CR tablet 40 mEq (40 mEq Oral Given 07/20/17 0205)  phenytoin (DILANTIN) ER capsule 300 mg (300 mg Oral Given 07/20/17 0205)     ED Discharge Orders        Ordered    phenytoin (DILANTIN) 100 MG ER capsule  3 times daily     07/20/17 0314    levETIRAcetam (KEPPRA) 500 MG tablet  2 times daily     07/20/17 0314    potassium chloride SA (KLOR-CON M20) 20 MEQ tablet  Daily     07/20/17 0316       Note:  This document was prepared using Dragon voice recognition software and may include unintentional dictation errors.    Loleta Rose, MD 07/20/17 [phone]

## 2017-07-20 NOTE — ED Notes (Signed)
Patient wheeled outside per patients request to wait for family to come get her.

## 2017-07-20 NOTE — Discharge Instructions (Addendum)
As we discussed, your work-up was reassuring today with normal lab work for a slightly low potassium for which we prescribed you a supplement.  Your Dilantin level is still low although it is clear you have been taking it over the course the last week.  We have increased your dose and I also wrote a prescription for Keppra, another antiseizure medicine, since the Dilantin quite working it.  We strongly encourage you to [facility], and we also provided outpatient follow-up information for Dr. Malvin JohnsPotter, a local neurologist, although it often takes quite a long time to get an appointment locally.  Take your regular medications as prescribed, avoid alcohol and drugs, try to get plenty of sleep, and do not drive until cleared by a neurologist.

## 2017-07-20 NOTE — ED Notes (Signed)
Pt resting in the bed. NAD noted. Lights dimmed.

## 2017-09-15 ENCOUNTER — Emergency Department
Admission: EM | Admit: 2017-09-15 | Discharge: 2017-09-15 | Disposition: A | Discharge status: Home / Self Care | Payer: BLUE CROSS/BLUE SHIELD | Attending: Emergency Medicine | Admitting: Emergency Medicine

## 2017-09-15 ENCOUNTER — Encounter: Payer: Self-pay | Admitting: Emergency Medicine

## 2017-09-15 ENCOUNTER — Other Ambulatory Visit: Payer: Self-pay

## 2017-09-15 DIAGNOSIS — Y93G3 Activity, cooking and baking: Secondary | ICD-10-CM | POA: Insufficient documentation

## 2017-09-15 DIAGNOSIS — Y92000 Kitchen of unspecified non-institutional (private) residence as  the place of occurrence of the external cause: Secondary | ICD-10-CM | POA: Insufficient documentation

## 2017-09-15 DIAGNOSIS — F17210 Nicotine dependence, cigarettes, uncomplicated: Secondary | ICD-10-CM | POA: Insufficient documentation

## 2017-09-15 DIAGNOSIS — T23201A Burn of second degree of right hand, unspecified site, initial encounter: Secondary | ICD-10-CM | POA: Insufficient documentation

## 2017-09-15 DIAGNOSIS — X102XXA Contact with fats and cooking oils, initial encounter: Secondary | ICD-10-CM | POA: Insufficient documentation

## 2017-09-15 DIAGNOSIS — T23271A Burn of second degree of right wrist, initial encounter: Secondary | ICD-10-CM | POA: Insufficient documentation

## 2017-09-15 DIAGNOSIS — I10 Essential (primary) hypertension: Secondary | ICD-10-CM | POA: Insufficient documentation

## 2017-09-15 DIAGNOSIS — Y999 Unspecified external cause status: Secondary | ICD-10-CM | POA: Insufficient documentation

## 2017-09-15 DIAGNOSIS — T310 Burns involving less than 10% of body surface: Secondary | ICD-10-CM | POA: Insufficient documentation

## 2017-09-15 DIAGNOSIS — Z79899 Other long term (current) drug therapy: Secondary | ICD-10-CM | POA: Insufficient documentation

## 2017-09-15 MED ORDER — LIDOCAINE 2 % EX GEL: 1.0000 "application " | Freq: Three times a day (TID) | CUTANEOUS | 1 refills | Status: DC | PRN

## 2017-09-15 MED ORDER — HYDROCODONE-ACETAMINOPHEN 5-325 MG PO TABS: 1.0000 | ORAL_TABLET | ORAL | 0 refills | Status: DC | PRN

## 2017-09-15 MED ORDER — OXYCODONE-ACETAMINOPHEN 5-325 MG PO TABS
1.0000 | ORAL_TABLET | Freq: Once | ORAL | Status: AC
  Administered 2017-09-15: 1 via ORAL
  Filled 2017-09-15: qty 1

## 2017-09-15 MED ORDER — LIDOCAINE HCL URETHRAL/MUCOSAL 2 % EX GEL
1.0000 "application " | Freq: Once | CUTANEOUS | Status: AC
  Administered 2017-09-15: 1 via TOPICAL
  Filled 2017-09-15: qty 10
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
## 2018-01-15 ENCOUNTER — Other Ambulatory Visit: Payer: Self-pay

## 2018-01-15 ENCOUNTER — Encounter: Payer: Self-pay | Admitting: Emergency Medicine

## 2018-01-15 ENCOUNTER — Emergency Department
Admission: EM | Admit: 2018-01-15 | Discharge: 2018-01-15 | Disposition: A | Discharge status: Home / Self Care | Payer: Self-pay | Attending: Student in an Organized Health Care Education/Training Program | Admitting: Student in an Organized Health Care Education/Training Program

## 2018-01-15 ENCOUNTER — Emergency Department: Payer: Self-pay

## 2018-01-15 DIAGNOSIS — S86912A Strain of unspecified muscle(s) and tendon(s) at lower leg level, left leg, initial encounter: Secondary | ICD-10-CM | POA: Insufficient documentation

## 2018-01-15 DIAGNOSIS — X500XXA Overexertion from strenuous movement or load, initial encounter: Secondary | ICD-10-CM | POA: Insufficient documentation

## 2018-01-15 DIAGNOSIS — Y990 Civilian activity done for income or pay: Secondary | ICD-10-CM | POA: Insufficient documentation

## 2018-01-15 DIAGNOSIS — Y9389 Activity, other specified: Secondary | ICD-10-CM | POA: Insufficient documentation

## 2018-01-15 DIAGNOSIS — F17210 Nicotine dependence, cigarettes, uncomplicated: Secondary | ICD-10-CM | POA: Insufficient documentation

## 2018-01-15 DIAGNOSIS — I10 Essential (primary) hypertension: Secondary | ICD-10-CM | POA: Insufficient documentation

## 2018-01-15 DIAGNOSIS — T148XXA Other injury of unspecified body region, initial encounter: Secondary | ICD-10-CM

## 2018-01-15 DIAGNOSIS — R82998 Other abnormal findings in urine: Secondary | ICD-10-CM | POA: Insufficient documentation

## 2018-01-15 DIAGNOSIS — Y9289 Other specified places as the place of occurrence of the external cause: Secondary | ICD-10-CM | POA: Insufficient documentation

## 2018-01-15 DIAGNOSIS — Z79899 Other long term (current) drug therapy: Secondary | ICD-10-CM | POA: Insufficient documentation

## 2018-01-15 LAB — URINALYSIS, COMPLETE (UACMP) WITH MICROSCOPIC
BILIRUBIN URINE: NEGATIVE
Glucose, UA: NEGATIVE mg/dL
KETONES UR: NEGATIVE mg/dL
Leukocytes, UA: NEGATIVE
NITRITE: NEGATIVE
Protein, ur: 100 mg/dL — AB
Specific Gravity, Urine: 1.019 (ref 1.005–1.030)
pH: 6 (ref 5.0–8.0)

## 2018-01-15 LAB — POCT PREGNANCY, URINE: Preg Test, Ur: NEGATIVE

## 2018-01-15 MED ORDER — CYCLOBENZAPRINE HCL 5 MG PO TABS: ORAL_TABLET | ORAL | 0 refills | Status: DC

## 2018-01-15 MED ORDER — ORPHENADRINE CITRATE 30 MG/ML IJ SOLN
60.0000 mg | Freq: Two times a day (BID) | INTRAMUSCULAR | Status: DC
  Administered 2018-01-15: 60 mg via INTRAMUSCULAR
  Filled 2018-01-15: qty 2

## 2018-01-15 MED ORDER — LIDOCAINE 5 % EX PTCH
1.0000 | MEDICATED_PATCH | CUTANEOUS | Status: DC
  Administered 2018-01-15: 1 via TRANSDERMAL
  Filled 2018-01-15: qty 1

## 2018-01-15 MED ORDER — OXYCODONE-ACETAMINOPHEN 5-325 MG PO TABS
1.0000 | ORAL_TABLET | Freq: Once | ORAL | Status: AC
  Administered 2018-01-15: 1 via ORAL
  Filled 2018-01-15: qty 1

## 2018-01-15 MED ORDER — KETOROLAC TROMETHAMINE 30 MG/ML IJ SOLN
30.0000 mg | Freq: Once | INTRAMUSCULAR | Status: AC
  Administered 2018-01-15: 30 mg via INTRAMUSCULAR
  Filled 2018-01-15: qty 1

## 2018-01-15 MED ORDER — OXYCODONE-ACETAMINOPHEN 5-325 MG PO TABS: 1.0000 | ORAL_TABLET | ORAL | 0 refills | Status: DC | PRN

## 2018-01-15 MED ORDER — MELOXICAM 15 MG PO TABS: 15.0000 mg | ORAL_TABLET | Freq: Every day | ORAL | 0 refills | Status: AC

## 2018-01-15 MED ORDER — LIDOCAINE 5 % EX PTCH: 1.0000 | MEDICATED_PATCH | CUTANEOUS | 0 refills | Status: DC

## 2018-01-15 NOTE — ED Provider Notes (Signed)
[facility] Emergency Department Provider Note  ____________________________________________  Time seen: Approximately 3:58 PM  I have reviewed the triage vital signs and the nursing notes.   HISTORY  Chief Complaint Leg Pain    HPI Kristy Kirby is a 42 y.o. female that presents emergency department for evaluation of left upper leg and groin pain for 1 day.  Pain started this morning when she got out of bed.  She states that it is painful to move her leg in any direction and painful to bear weight.  Patient states that it feels like it is in the muscle.  Leg is not weak. She was lifting extremely heavy cylinders that were taller than her yesterday at work.   She is currently on her menstrual cycle. No fever, chills, nausea, vomiting, abdominal pain, dysuria, hematuria, leg swelling, calf pain.  Past Medical History:  Diagnosis Date  . Anxiety   . Depression   . HTN (hypertension)   . Seizures (HCC)     There are no active problems to display for this patient.   Past Surgical History:  Procedure Laterality Date  . TUBAL LIGATION      Prior to Admission medications   Medication Sig Start Date End Date Taking? Authorizing Provider  cyclobenzaprine (FLEXERIL) 5 MG tablet Take 1-2 tablets 3 times daily as needed 01/15/18   Enid Derry, PA-C  FLUoxetine (PROZAC) 40 MG capsule Take 40 mg by mouth daily.    [provider]  hydrochlorothiazide (HYDRODIURIL) 25 MG tablet Take 1 tablet (25 mg total) by mouth daily. 01/28/16   Sharman Cheek, MD  HYDROcodone-acetaminophen (NORCO) 5-325 MG tablet Take 1 tablet by mouth every 4 (four) hours as needed for moderate pain. 06/10/16   Willy Eddy, MD  HYDROcodone-acetaminophen (NORCO/VICODIN) 5-325 MG tablet Take 1 tablet by mouth every 4 (four) hours as needed for moderate pain. 09/15/17   Cuthriell, Delorise Royals, PA-C  levETIRAcetam (KEPPRA) 500 MG tablet Take 1 tablet (500 mg total) by mouth 2 (two)  times daily. 07/20/17 08/19/17  Loleta Rose, MD  lidocaine (LIDODERM) 5 % Place 1 patch onto the skin daily. Remove & Discard patch within 12 hours or as directed by MD 01/15/18   Enid Derry, PA-C  lisinopril (PRINIVIL,ZESTRIL) 20 MG tablet Take 1 tablet (20 mg total) by mouth daily. 01/28/16 01/27/17  Sharman Cheek, MD  meloxicam (MOBIC) 15 MG tablet Take 1 tablet (15 mg total) by mouth daily for 10 days. 01/15/18 01/25/18  Enid Derry, PA-C  metoprolol tartrate (LOPRESSOR) 25 MG tablet Take 25 mg by mouth 2 (two) times daily.    [provider]  naproxen (NAPROSYN) 500 MG tablet Take 1 tablet (500 mg total) by mouth 2 (two) times daily with a meal. 07/18/17   Triplett, Cari B, FNP  ondansetron (ZOFRAN) 4 MG tablet Take 1 tablet (4 mg total) by mouth every 8 (eight) hours as needed for nausea or vomiting. 06/09/17   Jeanmarie Plant, MD  oseltamivir (TAMIFLU) 75 MG capsule Take 1 capsule (75 mg total) by mouth 2 (two) times daily. 04/01/16   Enid Derry, PA-C  oxyCODONE-acetaminophen (PERCOCET) 5-325 MG tablet Take 1 tablet by mouth every 4 (four) hours as needed for severe pain. 01/15/18 01/15/19  Enid Derry, PA-C  phenytoin (DILANTIN) 100 MG ER capsule Take 2 capsules (200 mg total) by mouth 3 (three) times daily. 07/20/17 07/20/18  Loleta Rose, MD  potassium chloride SA (KLOR-CON M20) 20 MEQ tablet Take 1 tablet (20 mEq  total) by mouth daily. 07/20/17   Loleta RoseForbach, Cory, MD    Allergies Banana; Coconut fatty acids; Penicillins; and Sulfa antibiotics  History reviewed. No pertinent family history.  Social History Social History   Tobacco Use  . Smoking status: Current Every Day Smoker    Packs/day: 0.50    Types: Cigarettes  . Smokeless tobacco: Never Used  Substance Use Topics  . Alcohol use: No  . Drug use: No     Review of Systems  Constitutional: No fever/chills Cardiovascular: No chest pain. Respiratory: No cough. No SOB. Gastrointestinal: No abdominal pain.  No  nausea, no vomiting.  Musculoskeletal: Positive for leg pain.  Skin: Negative for rash, abrasions, lacerations, ecchymosis. Neurological: Negative for numbness or tingling   ____________________________________________   PHYSICAL EXAM:  VITAL SIGNS: ED Triage Vitals  Enc Vitals Group     BP 01/15/18 1509 (!) 169/115     Pulse Rate 01/15/18 1509 96     Resp --      Temp 01/15/18 1509 98.6 F (37 C)     Temp Source 01/15/18 1509 Oral     SpO2 01/15/18 1509 100 %     Weight --      Height 01/15/18 1511 5\' 2"  (1.575 m)     Head Circumference --      Peak Flow --      Pain Score 01/15/18 1511 10     Pain Loc --      Pain Edu? --      Excl. in GC? --      Constitutional: Alert and oriented. Well appearing and in no acute distress. Eyes: Conjunctivae are normal. PERRL. EOMI. Head: Atraumatic. ENT:      Ears:      Nose: No congestion/rhinnorhea.      Mouth/Throat: Mucous membranes are moist.  Neck: No stridor. Cardiovascular: Normal rate, regular rhythm.  Good peripheral circulation. Respiratory: Normal respiratory effort without tachypnea or retractions. Lungs CTAB. Good air entry to the bases with no decreased or absent breath sounds. Gastrointestinal: Bowel sounds 4 quadrants. Soft and nontender to palpation. No guarding or rigidity. No palpable masses. No distention.  Musculoskeletal: Full range of motion to all extremities. No gross deformities appreciated.  Tenderness to palpation to proximal left thigh and into groin.  No change with cough or vaginal maneuvers.  No mass or lumps felt.  Patient states that pain radiates to hip with palpation of proximal thigh. Thigh pain elicited with flexion of hip. Full ROM of hip. No lumbar spinal or paraspinal tenderness.  Neurologic:  Normal speech and language. No gross focal neurologic deficits are appreciated.  Skin:  Skin is warm, dry and intact. No rash noted. Psychiatric: Mood and affect are normal. Speech and behavior are  normal. Patient exhibits appropriate insight and judgement.   ____________________________________________   LABS (all labs ordered are listed, but only abnormal results are displayed)  Labs Reviewed  URINALYSIS, COMPLETE (UACMP) WITH MICROSCOPIC - Abnormal; Notable for the following components:      Result Value   Color, Urine AMBER (*)    APPearance HAZY (*)    Hgb urine dipstick LARGE (*)    Protein, ur 100 (*)    RBC / HPF >50 (*)    Bacteria, UA RARE (*)    All other components within normal limits  URINE CULTURE  POC URINE PREG, ED  POCT PREGNANCY, URINE   ____________________________________________  EKG   ____________________________________________  RADIOLOGY Lexine BatonI, Kareem Aul, personally viewed and evaluated  these images (plain radiographs) as part of my medical decision making, as well as reviewing the written report by the radiologist.  Dg Hip Unilat W Or Wo Pelvis 2-3 Views Left  Result Date: 01/15/2018 CLINICAL DATA:  Left hip pain since yesterday. No trauma. Unable to bear weight. EXAM: DG HIP (WITH OR WITHOUT PELVIS) 2-3V LEFT COMPARISON:  None. FINDINGS: Mild degenerative changes in the left hip. No fracture or dislocation. No bony erosion. No dislocation. IMPRESSION: No cause for the patient's pain identified. Electronically Signed   By: Gerome Sam III M.D   On: 01/15/2018 18:47    ____________________________________________    PROCEDURES  Procedure(s) performed:    Procedures    Medications  orphenadrine (NORFLEX) injection 60 mg (60 mg Intramuscular Given 01/15/18 1718)  lidocaine (LIDODERM) 5 % 1 patch (1 patch Transdermal Patch Applied 01/15/18 1718)  ketorolac (TORADOL) 30 MG/ML injection 30 mg (30 mg Intramuscular Given 01/15/18 1718)  oxyCODONE-acetaminophen (PERCOCET/ROXICET) 5-325 MG per tablet 1 tablet (1 tablet Oral Given 01/15/18 1837)     ____________________________________________   INITIAL IMPRESSION / ASSESSMENT AND PLAN  / ED COURSE  Pertinent labs & imaging results that were available during my care of the patient were reviewed by me and considered in my medical decision making (see chart for details).  Review of the  [place] was performed in accordance of the [facility] prior to dispensing any controlled drugs.    Patient's diagnosis is consistent with muscle strain.  Vital signs and exam are reassuring.  Blood pressure is elevated in the emergency department and patient will take her blood pressure medication at home.  Blood pressure is also likely elevated due to pain.  She denies any headache, shortness breath, chest pain.  Symptoms are consistent with muscle strain.  Hip x-ray consistent with mild osteoarthritis.  I do no not palpate any hernia.  Urinalysis shows rare bacteria, likely due to contamination.  Urinalysis also shows blood, likely due to patient's current menstrual cycle.   Patient verbalized that when she urinated in a cup, she accidentally let a little bit of her menstruation in the cup as well. No indication of kidney stone.  She is eating saltines, graham crackers, peanut butter and drinking [facility].  Pain improved with medication.  Patient will be discharged home with prescriptions for meloxicam, Flexeril, Lidoderm, a short course of Percocet. Patient is to follow up with PCP as directed. Patient is given ED precautions to return to the ED for any worsening or new symptoms.     ____________________________________________  FINAL CLINICAL IMPRESSION(S) / ED DIAGNOSES  Final diagnoses:  Hypertension, unspecified type  Muscle strain      NEW MEDICATIONS STARTED DURING THIS VISIT:  ED Discharge Orders         Ordered    cyclobenzaprine (FLEXERIL) 5 MG tablet     01/15/18 1929    oxyCODONE-acetaminophen (PERCOCET) 5-325 MG tablet  Every 4 hours PRN     01/15/18 1929    lidocaine (LIDODERM) 5 %  Every 24 hours     01/15/18 1929    meloxicam (MOBIC) 15 MG tablet  Daily     01/15/18  1929              This chart was dictated using voice recognition software/Dragon. Despite best efforts to proofread, errors can occur which can change the meaning. Any change was purely unintentional.    Enid Derry, PA-C 01/16/18 2215    Willy Eddy, MD 01/18/18 [phone]

## 2018-01-15 NOTE — Discharge Instructions (Addendum)
Your symptoms are consistent with a muscle strain.  Your hip x-ray shows some mild arthritis.  Please use crutches for a couple of days and take Flexeril and Mobic for muscle strain.  You can take Percocet for severe pain.  Return to the emergency department for fever, vomiting, abdominal pain, urinary pain, any other symptoms concerning to you.  Your blood pressure was high in the emergency department.  Please take your blood pressure medication as prescribed to prevent a stroke or heart attack.

## 2018-01-15 NOTE — ED Triage Notes (Addendum)
Pt to ED with c/o of upper left leg pain that started today. Pt states unable to stand on it. Pt states BP (169/115) is normal and she has not taken her meds today.

## 2018-01-17 LAB — URINE CULTURE

## 2018-01-27 ENCOUNTER — Emergency Department
Admission: EM | Admit: 2018-01-27 | Discharge: 2018-01-27 | Disposition: A | Discharge status: Home / Self Care | Payer: Self-pay | Attending: Emergency Medicine | Admitting: Emergency Medicine

## 2018-01-27 ENCOUNTER — Other Ambulatory Visit: Payer: Self-pay

## 2018-01-27 ENCOUNTER — Encounter: Payer: Self-pay | Admitting: Emergency Medicine

## 2018-01-27 ENCOUNTER — Emergency Department: Payer: Self-pay

## 2018-01-27 DIAGNOSIS — M79604 Pain in right leg: Secondary | ICD-10-CM

## 2018-01-27 DIAGNOSIS — I10 Essential (primary) hypertension: Secondary | ICD-10-CM | POA: Insufficient documentation

## 2018-01-27 DIAGNOSIS — F17210 Nicotine dependence, cigarettes, uncomplicated: Secondary | ICD-10-CM | POA: Insufficient documentation

## 2018-01-27 DIAGNOSIS — Z79899 Other long term (current) drug therapy: Secondary | ICD-10-CM | POA: Insufficient documentation

## 2018-01-27 DIAGNOSIS — M7121 Synovial cyst of popliteal space [Baker], right knee: Secondary | ICD-10-CM | POA: Insufficient documentation

## 2018-01-27 DIAGNOSIS — M79661 Pain in right lower leg: Secondary | ICD-10-CM | POA: Insufficient documentation

## 2018-01-27 MED ORDER — NAPROXEN 500 MG PO TABS: 500.0000 mg | ORAL_TABLET | Freq: Two times a day (BID) | ORAL | 2 refills | Status: DC

## 2018-01-27 MED ORDER — KETOROLAC TROMETHAMINE 30 MG/ML IJ SOLN
30.0000 mg | Freq: Once | INTRAMUSCULAR | Status: AC
  Administered 2018-01-27: 30 mg via INTRAMUSCULAR
  Filled 2018-01-27: qty 1

## 2018-01-27 NOTE — ED Notes (Signed)
Of note, pt is on HTN medication and states that she did not take it tonight as ordered due to "having a rough day".

## 2018-01-27 NOTE — ED Triage Notes (Signed)
Pt arrives POV and ambulatory to triage with c/o right leg pain without injury x 2 days. Pt is in NAD.

## 2018-01-27 NOTE — ED Provider Notes (Signed)
[facility] Emergency Department Provider Note   ____________________________________________    I have reviewed the triage vital signs and the nursing notes.   HISTORY  Chief Complaint Leg Pain     HPI Kristy Kirby is a 42 y.o. female who presents with complaints of right calf pain.  Patient reports she has had cramping in her right calf over the last several days.  Has been increasing her salt intake and took a bath and Epson salts today.  However she was walking earlier in the evening and felt like her leg gave out on her.  She denies pleurisy.  No recent travel.  No chest pain.  Review of records demonstrates seen 2 weeks ago for left-sided l leg pain  Past Medical History:  Diagnosis Date  . Anxiety   . Depression   . HTN (hypertension)   . Seizures (HCC)     There are no active problems to display for this patient.   Past Surgical History:  Procedure Laterality Date  . TUBAL LIGATION      Prior to Admission medications   Medication Sig Start Date End Date Taking? Authorizing Provider  cyclobenzaprine (FLEXERIL) 5 MG tablet Take 1-2 tablets 3 times daily as needed 01/15/18   Enid Derry, PA-C  FLUoxetine (PROZAC) 40 MG capsule Take 40 mg by mouth daily.    [provider]  hydrochlorothiazide (HYDRODIURIL) 25 MG tablet Take 1 tablet (25 mg total) by mouth daily. 01/28/16   Sharman Cheek, MD  HYDROcodone-acetaminophen (NORCO) 5-325 MG tablet Take 1 tablet by mouth every 4 (four) hours as needed for moderate pain. 06/10/16   Willy Eddy, MD  HYDROcodone-acetaminophen (NORCO/VICODIN) 5-325 MG tablet Take 1 tablet by mouth every 4 (four) hours as needed for moderate pain. 09/15/17   Cuthriell, Delorise Royals, PA-C  levETIRAcetam (KEPPRA) 500 MG tablet Take 1 tablet (500 mg total) by mouth 2 (two) times daily. 07/20/17 08/19/17  Loleta Rose, MD  lidocaine (LIDODERM) 5 % Place 1 patch onto the skin daily. Remove & Discard patch  within 12 hours or as directed by MD 01/15/18   Enid Derry, PA-C  lisinopril (PRINIVIL,ZESTRIL) 20 MG tablet Take 1 tablet (20 mg total) by mouth daily. 01/28/16 01/27/17  Sharman Cheek, MD  metoprolol tartrate (LOPRESSOR) 25 MG tablet Take 25 mg by mouth 2 (two) times daily.    [provider]  naproxen (NAPROSYN) 500 MG tablet Take 1 tablet (500 mg total) by mouth 2 (two) times daily with a meal. 01/27/18   Jene Every, MD  ondansetron (ZOFRAN) 4 MG tablet Take 1 tablet (4 mg total) by mouth every 8 (eight) hours as needed for nausea or vomiting. 06/09/17   Jeanmarie Plant, MD  oseltamivir (TAMIFLU) 75 MG capsule Take 1 capsule (75 mg total) by mouth 2 (two) times daily. 04/01/16   Enid Derry, PA-C  oxyCODONE-acetaminophen (PERCOCET) 5-325 MG tablet Take 1 tablet by mouth every 4 (four) hours as needed for severe pain. 01/15/18 01/15/19  Enid Derry, PA-C  phenytoin (DILANTIN) 100 MG ER capsule Take 2 capsules (200 mg total) by mouth 3 (three) times daily. 07/20/17 07/20/18  Loleta Rose, MD  potassium chloride SA (KLOR-CON M20) 20 MEQ tablet Take 1 tablet (20 mEq total) by mouth daily. 07/20/17   Loleta Rose, MD     Allergies Banana; Coconut fatty acids; Penicillins; and Sulfa antibiotics  No family history on file.  Social History Social History   Tobacco Use  . Smoking  status: Current Every Day Smoker    Packs/day: 0.50    Types: Cigarettes  . Smokeless tobacco: Never Used  Substance Use Topics  . Alcohol use: No  . Drug use: No    Review of Systems  Constitutional: No fever/chills Eyes: No visual changes.  ENT: No sore throat. Cardiovascular: Denies chest pain. Respiratory: Denies shortness of breath. Gastrointestinal: No abdominal pain.  No nausea, no vomiting.   Genitourinary: Negative for dysuria. Musculoskeletal: As above Skin: Negative for rash. Neurological: Negative for headaches or  weakness   ____________________________________________   PHYSICAL EXAM:  VITAL SIGNS: ED Triage Vitals  Enc Vitals Group     BP 01/27/18 2039 (!) 204/110     Pulse Rate 01/27/18 2039 95     Resp 01/27/18 2039 18     Temp 01/27/18 2039 98.7 F (37.1 C)     Temp Source 01/27/18 2039 Oral     SpO2 01/27/18 2039 98 %     Weight 01/27/18 2043 81.6 kg (180 lb)     Height 01/27/18 2043 1.575 m (5\' 2" )     Head Circumference --      Peak Flow --      Pain Score 01/27/18 2043 10     Pain Loc --      Pain Edu? --      Excl. in GC? --     Constitutional: Alert and oriented. No acute distress.  Eyes: Conjunctivae are normal.   Nose: No congestion/rhinnorhea. Mouth/Throat: Mucous membranes are moist.    Cardiovascular: Normal rate, regular rhythm. Grossly normal heart sounds.  Good peripheral circulation. Respiratory: Normal respiratory effort.  No retractions. Lungs CTAB. Gastrointestinal: Soft and nontender. No distention.  No CVA tenderness.  Musculoskeletal: Mild right posterior calf tenderness, no significant swelling.  2+ distal pulses Neurologic:  Normal speech and language. No gross focal neurologic deficits are appreciated.  Skin:  Skin is warm, dry and intact. No rash noted. Psychiatric: Mood and affect are normal. Speech and behavior are normal.  ____________________________________________   LABS (all labs ordered are listed, but only abnormal results are displayed)  Labs Reviewed - No data to display ____________________________________________  EKG  None ____________________________________________  RADIOLOGY  Ultrasound negative for DVT small Baker's cyst ____________________________________________   PROCEDURES  Procedure(s) performed: No  Procedures   Critical Care performed: No ____________________________________________   INITIAL IMPRESSION / ASSESSMENT AND PLAN / ED COURSE  Pertinent labs & imaging results that were available during  my care of the patient were reviewed by me and considered in my medical decision making (see chart for details).  Patient presents with right calf pain as noted above, ultrasound negative for DVT, very small Baker's cyst, this may be causing some of her symptoms.  Will treat with anti-inflammatories have her follow-up with orthopedics  Ultrasound negative for DVT, very small Baker's cyst although her pain is primarily in her calf and not behind her knee.  Will treat with anti-inflammatories, rest, Ortho follow-up as needed    ____________________________________________   FINAL CLINICAL IMPRESSION(S) / ED DIAGNOSES  Final diagnoses:  Right leg pain  Baker's cyst of knee, right        Note:  This document was prepared using Dragon voice recognition software and may include unintentional dictation errors.    Jene EveryKinner, Rylei Codispoti, MD 01/27/18 2223

## 2018-04-19 ENCOUNTER — Emergency Department
Admission: EM | Admit: 2018-04-19 | Discharge: 2018-04-19 | Discharge status: Against Medical Advice | Payer: Self-pay | Attending: Emergency Medicine | Admitting: Emergency Medicine

## 2018-04-19 ENCOUNTER — Other Ambulatory Visit: Payer: Self-pay

## 2018-04-19 ENCOUNTER — Encounter: Payer: Self-pay | Admitting: Emergency Medicine

## 2018-04-19 ENCOUNTER — Emergency Department: Payer: Self-pay

## 2018-04-19 DIAGNOSIS — Z5321 Procedure and treatment not carried out due to patient leaving prior to being seen by health care provider: Secondary | ICD-10-CM | POA: Insufficient documentation

## 2018-04-19 DIAGNOSIS — R079 Chest pain, unspecified: Secondary | ICD-10-CM | POA: Insufficient documentation

## 2018-04-19 DIAGNOSIS — R05 Cough: Secondary | ICD-10-CM | POA: Insufficient documentation

## 2018-04-19 LAB — CBC
HCT: 41.7 % (ref 36.0–46.0)
Hemoglobin: 13.9 g/dL (ref 12.0–15.0)
MCH: 30.1 pg (ref 26.0–34.0)
MCHC: 33.3 g/dL (ref 30.0–36.0)
MCV: 90.3 fL (ref 80.0–100.0)
Platelets: 237 10*3/uL (ref 150–400)
RBC: 4.62 MIL/uL (ref 3.87–5.11)
RDW: 14.7 % (ref 11.5–15.5)
WBC: 14.4 10*3/uL — ABNORMAL HIGH (ref 4.0–10.5)
nRBC: 0 % (ref 0.0–0.2)

## 2018-04-19 LAB — BASIC METABOLIC PANEL
Anion gap: 11 (ref 5–15)
BUN: 10 mg/dL (ref 6–20)
CO2: 22 mmol/L (ref 22–32)
Calcium: 8.9 mg/dL (ref 8.9–10.3)
Chloride: 107 mmol/L (ref 98–111)
Creatinine, Ser: 0.82 mg/dL (ref 0.44–1.00)
GFR calc Af Amer: >60 mL/min (ref >60)
GFR calc non Af Amer: >60 mL/min (ref >60)
Glucose, Bld: 108 mg/dL — ABNORMAL HIGH (ref 70–99)
Potassium: 3.3 mmol/L — ABNORMAL LOW (ref 3.5–5.1)
Sodium: 140 mmol/L (ref 135–145)

## 2018-04-19 LAB — TROPONIN I

## 2018-04-19 NOTE — ED Triage Notes (Signed)
Pt called from wr to treatment room, no response 

## 2018-04-19 NOTE — ED Triage Notes (Signed)
Pt called from [facility] to treatment room x's 2.

## 2018-04-19 NOTE — ED Triage Notes (Addendum)
C/o chest pain for last 2 days. Chest pain worse when takes deep breath. Pain also in back.  Reports multiple syncope episodes as well over last 2 days.  Denies cough or fever.  Feels SHOB.  Denies hx blood clot.  No birth control. Non smoker. No travel.  Hx anxiety.  Pt reports syncope is when standing. Ambulated to triage without difficulty. Pt has not had her BP meds.

## 2018-04-19 NOTE — ED Notes (Signed)
First Nurse Note: Patient complaining of CP with cough, congestion, and nosebleed.  EKG performed and reviewed by Dr. Alphonzo Lemmings.

## 2018-04-20 ENCOUNTER — Telehealth: Payer: Self-pay | Admitting: Emergency Medicine

## 2018-04-20 NOTE — Telephone Encounter (Signed)
Called patient due to lwot to inquire about condition and follow up plans. She says she is not better. Had to leave due to ride.  Says she is planning on coming back. She does not have a pcp.

## 2018-05-15 ENCOUNTER — Encounter: Payer: Self-pay | Admitting: Emergency Medicine

## 2018-05-15 ENCOUNTER — Emergency Department
Admission: EM | Admit: 2018-05-15 | Discharge: 2018-05-15 | Disposition: A | Discharge status: Home / Self Care | Payer: Self-pay | Attending: Emergency Medicine | Admitting: Emergency Medicine

## 2018-05-15 ENCOUNTER — Emergency Department: Payer: Self-pay

## 2018-05-15 ENCOUNTER — Other Ambulatory Visit: Payer: Self-pay

## 2018-05-15 DIAGNOSIS — Z760 Encounter for issue of repeat prescription: Secondary | ICD-10-CM | POA: Insufficient documentation

## 2018-05-15 DIAGNOSIS — F17210 Nicotine dependence, cigarettes, uncomplicated: Secondary | ICD-10-CM | POA: Insufficient documentation

## 2018-05-15 DIAGNOSIS — Z79899 Other long term (current) drug therapy: Secondary | ICD-10-CM | POA: Insufficient documentation

## 2018-05-15 DIAGNOSIS — M25562 Pain in left knee: Secondary | ICD-10-CM | POA: Insufficient documentation

## 2018-05-15 DIAGNOSIS — I10 Essential (primary) hypertension: Secondary | ICD-10-CM | POA: Insufficient documentation

## 2018-05-15 MED ORDER — ALBUTEROL SULFATE HFA 108 (90 BASE) MCG/ACT IN AERS: 2.0000 | INHALATION_SPRAY | Freq: Four times a day (QID) | RESPIRATORY_TRACT | 0 refills | Status: DC | PRN

## 2018-05-15 MED ORDER — HYDROCHLOROTHIAZIDE 25 MG PO TABS: 25.0000 mg | ORAL_TABLET | Freq: Every day | ORAL | 1 refills | Status: DC

## 2018-05-15 MED ORDER — MELOXICAM 15 MG PO TABS: 15.0000 mg | ORAL_TABLET | Freq: Every day | ORAL | 0 refills | Status: AC

## 2018-05-15 MED ORDER — LISINOPRIL 20 MG PO TABS: 20.0000 mg | ORAL_TABLET | Freq: Every day | ORAL | 1 refills | Status: DC

## 2018-05-15 MED ORDER — METOPROLOL TARTRATE 25 MG PO TABS: 25.0000 mg | ORAL_TABLET | Freq: Two times a day (BID) | ORAL | 1 refills | Status: DC

## 2018-05-15 NOTE — Discharge Instructions (Addendum)
Your ultrasound does not show a clot in your left leg.  Please begin anti-inflammatories for inflammation in your knee.  Rest, ice, elevate your knee tomorrow when you stay home from work.  Your blood pressure is elevated in the emergency department.  I have refilled your blood pressure medications.  These make an appointment with primary care for continued management of your blood pressure and further refill of your blood pressure medications.

## 2018-05-15 NOTE — ED Notes (Signed)
See triage note  Presents with left k nee pain  States she has not had a fall   Good pulses

## 2018-05-15 NOTE — ED Triage Notes (Addendum)
L knee pain since yesterday. Denies fall or injury. Regarding blood pressure, patient states this is low for her. States she is prescribed medication but has been out x 1 week. Needs prescription for blood pressure medication.

## 2018-05-15 NOTE — ED Notes (Signed)
Ace wrap was placed by this EDT. And pt verbally stated that she knows how to use her crutches correctly.

## 2018-05-15 NOTE — ED Provider Notes (Signed)
[facility] Emergency Department Provider Note  ____________________________________________  Time seen: Approximately 7:32 PM  I have reviewed the triage vital signs and the nursing notes.   HISTORY  Chief Complaint Knee Pain and Medication Refill    HPI Kristy Kirby is a 43 y.o. female that presents to the emergency department for evaluation of left posterior knee pain for 1 day.  Patient states that she has been working every day since March 12.  She is on her feet all day and she thinks this is what is causing her pain.  No alleviating measures have been attempted.  She would like to rest and stay out of work tomorrow.  No trauma.  Patient has a Baker's cyst in her right knee.  Patient states that she has been out of her blood pressure medications for 1 week.  She is also requesting a refill of her albuterol inhaler.  She states that she uses her albuterol inhaler as needed for shortness of breath and she only has 20 puffs left.  She [facility] for [facility] medications.  She takes metoprolol, lisinopril, hydrochlorothiazide.  She has been on these medications for years and has not had any dosage adjustments.  She has no concerns with her blood pressure medications.  She states that she has not had time to go to the clinic to get a refill because her fianc usually handles her medications and goes with her to appointments, and he is incarcerated.  No headaches, shortness of breath, chest pain.   Past Medical History:  Diagnosis Date  . Anxiety   . Depression   . HTN (hypertension)   . Seizures (HCC)     There are no active problems to display for this patient.   Past Surgical History:  Procedure Laterality Date  . TUBAL LIGATION      Prior to Admission medications   Medication Sig Start Date End Date Taking? Authorizing Provider  albuterol (PROVENTIL HFA;VENTOLIN HFA) 108 (90 Base) MCG/ACT inhaler Inhale 2 puffs into  the lungs every 6 (six) hours as needed for wheezing or shortness of breath. 05/15/18   Enid Derry, PA-C  cyclobenzaprine (FLEXERIL) 5 MG tablet Take 1-2 tablets 3 times daily as needed 01/15/18   Enid Derry, PA-C  FLUoxetine (PROZAC) 40 MG capsule Take 40 mg by mouth daily.    [provider]  hydrochlorothiazide (HYDRODIURIL) 25 MG tablet Take 1 tablet (25 mg total) by mouth daily. 05/15/18   Enid Derry, PA-C  levETIRAcetam (KEPPRA) 500 MG tablet Take 1 tablet (500 mg total) by mouth 2 (two) times daily. 07/20/17 08/19/17  Loleta Rose, MD  lidocaine (LIDODERM) 5 % Place 1 patch onto the skin daily. Remove & Discard patch within 12 hours or as directed by MD 01/15/18   Enid Derry, PA-C  lisinopril (PRINIVIL,ZESTRIL) 20 MG tablet Take 1 tablet (20 mg total) by mouth daily. 05/15/18 05/15/19  Enid Derry, PA-C  meloxicam (MOBIC) 15 MG tablet Take 1 tablet (15 mg total) by mouth daily for 10 days. 05/15/18 05/25/18  Enid Derry, PA-C  metoprolol tartrate (LOPRESSOR) 25 MG tablet Take 1 tablet (25 mg total) by mouth 2 (two) times daily. 05/15/18   Enid Derry, PA-C  naproxen (NAPROSYN) 500 MG tablet Take 1 tablet (500 mg total) by mouth 2 (two) times daily with a meal. 01/27/18   Jene Every, MD  ondansetron (ZOFRAN) 4 MG tablet Take 1 tablet (4 mg total) by mouth every 8 (eight)  hours as needed for nausea or vomiting. 06/09/17   Jeanmarie PlantMcShane, James A, MD  oxyCODONE-acetaminophen (PERCOCET) 5-325 MG tablet Take 1 tablet by mouth every 4 (four) hours as needed for severe pain. 01/15/18 01/15/19  Enid DerryWagner, Kayleah Appleyard, PA-C  phenytoin (DILANTIN) 100 MG ER capsule Take 2 capsules (200 mg total) by mouth 3 (three) times daily. 07/20/17 07/20/18  Loleta RoseForbach, Cory, MD  potassium chloride SA (KLOR-CON M20) 20 MEQ tablet Take 1 tablet (20 mEq total) by mouth daily. 07/20/17   Loleta RoseForbach, Cory, MD    Allergies Banana; Coconut fatty acids; Penicillins; and Sulfa antibiotics  No family history on file.  Social  History Social History   Tobacco Use  . Smoking status: Current Every Day Smoker    Packs/day: 0.50    Types: Cigarettes  . Smokeless tobacco: Never Used  Substance Use Topics  . Alcohol use: No  . Drug use: No     Review of Systems  Cardiovascular: No chest pain. Respiratory: No SOB. Gastrointestinal: No abdominal pain.  No nausea, no vomiting.  Musculoskeletal: Positive for knee pain. Skin: Negative for rash, abrasions, lacerations, ecchymosis. Neurological: Negative for headaches, numbness or tingling   ____________________________________________   PHYSICAL EXAM:  VITAL SIGNS: ED Triage Vitals  Enc Vitals Group     BP 05/15/18 1732 (!) 186/115     Pulse Rate 05/15/18 1732 94     Resp 05/15/18 1732 18     Temp 05/15/18 1732 98.3 F (36.8 C)     Temp Source 05/15/18 1732 Oral     SpO2 05/15/18 1732 97 %     Weight 05/15/18 1733 225 lb (102.1 kg)     Height 05/15/18 1733 5\' 2"  (1.575 m)     Head Circumference --      Peak Flow --      Pain Score 05/15/18 1733 9     Pain Loc --      Pain Edu? --      Excl. in GC? --      Constitutional: Alert and oriented. Well appearing and in no acute distress. Eyes: Conjunctivae are normal. PERRL. EOMI. Head: Atraumatic. ENT:      Ears:      Nose: No congestion/rhinnorhea.      Mouth/Throat: Mucous membranes are moist.  Neck: No stridor.  Cardiovascular: Normal rate, regular rhythm.  Good peripheral circulation. Respiratory: Normal respiratory effort without tachypnea or retractions. Lungs CTAB. Good air entry to the bases with no decreased or absent breath sounds. Gastrointestinal: Bowel sounds 4 quadrants. Soft and nontender to palpation. No guarding or rigidity. No palpable masses. No distention.  Musculoskeletal: Full range of motion to all extremities. No gross deformities appreciated.  Tenderness to palpation to posterior left knee.  Full range of motion of left knee without pain.  No tenderness to palpation of  left calf.  Normal gait. Neurologic:  Normal speech and language. No gross focal neurologic deficits are appreciated.  Skin:  Skin is warm, dry and intact. No rash noted. Psychiatric: Mood and affect are normal. Speech and behavior are normal. Patient exhibits appropriate insight and judgement.   ____________________________________________   LABS (all labs ordered are listed, but only abnormal results are displayed)  Labs Reviewed - No data to display ____________________________________________  EKG   ____________________________________________  RADIOLOGY  [place] Venous Img Lower Unilateral Left  Result Date: 05/15/2018 CLINICAL DATA:  Left posterior knee pain. Patient on anticoagulant therapy. EXAM: LEFT LOWER EXTREMITY VENOUS DOPPLER ULTRASOUND TECHNIQUE: Gray-scale sonography with graded compression,  as well as color Doppler and duplex ultrasound were performed to evaluate the lower extremity deep venous systems from the level of the common femoral vein and including the common femoral, femoral, profunda femoral, popliteal and calf veins including the posterior tibial, peroneal and gastrocnemius veins when visible. The superficial great saphenous vein was also interrogated. Spectral Doppler was utilized to evaluate flow at rest and with distal augmentation maneuvers in the common femoral, femoral and popliteal veins. COMPARISON:  None. FINDINGS: Contralateral Common Femoral Vein: Respiratory phasicity is normal and symmetric with the symptomatic side. No evidence of thrombus. Normal compressibility. Common Femoral Vein: No evidence of thrombus. Normal compressibility, respiratory phasicity and response to augmentation. Saphenofemoral Junction: No evidence of thrombus. Normal compressibility and flow on color Doppler imaging. Profunda Femoral Vein: No evidence of thrombus. Normal compressibility and flow on color Doppler imaging. Femoral Vein: No evidence of thrombus. Normal compressibility,  respiratory phasicity and response to augmentation. Popliteal Vein: No evidence of thrombus. Normal compressibility, respiratory phasicity and response to augmentation. Calf Veins: No evidence of thrombus. Normal compressibility and flow on color Doppler imaging. Superficial Great Saphenous Vein: No evidence of thrombus. Normal compressibility. Venous Reflux:  None. Other Findings:  None. IMPRESSION: No evidence of deep venous thrombosis. Electronically Signed   By: Elberta Fortis M.D.   On: 05/15/2018 19:32    ____________________________________________    PROCEDURES  Procedure(s) performed:    Procedures    Medications - No data to display   ____________________________________________   INITIAL IMPRESSION / ASSESSMENT AND PLAN / ED COURSE  Pertinent labs & imaging results that were available during my care of the patient were reviewed by me and considered in my medical decision making (see chart for details).  Review of the [place] CSRS was performed in accordance of the [facility] prior to dispensing any controlled drugs.     Patient presented to emergency department for evaluation of left knee pain for 1 day.  Vital signs and exam are reassuring.  Ultrasound negative for DVT.  No trauma.  Symptoms are likely inflammatory from being on her feet all day at work.  Patient is requesting to stay out of work and rest knee for 1 day.  Patient blood pressure is elevated in the emergency department and patient states that she is out of her blood pressure medications.  Blood pressure medications will be refilled for a short supply in the emergency department.  She has been on blood pressure medications for years, has had no dosage adjustments and has no concerns with medications.  Patient was educated to follow-up with [facility] for further refill of her blood pressure medications.  She is also requesting a refill of her albuterol inhaler.  She denies any shortness of breath, chest pain, headaches.   Patient will be discharged home with prescriptions for Mobic, lisinopril, hydrochlorothiazide, metoprolol. Patient is to follow up with primary care as directed. Patient is given ED precautions to return to the ED for any worsening or new symptoms.     ____________________________________________  FINAL CLINICAL IMPRESSION(S) / ED DIAGNOSES  Final diagnoses:  Acute pain of left knee  Encounter for medication refill  Hypertension, unspecified type      NEW MEDICATIONS STARTED DURING THIS VISIT:  ED Discharge Orders         Ordered    lisinopril (PRINIVIL,ZESTRIL) 20 MG tablet  Daily     05/15/18 1930    hydrochlorothiazide (HYDRODIURIL) 25 MG tablet  Daily     05/15/18 1930  metoprolol tartrate (LOPRESSOR) 25 MG tablet  2 times daily     05/15/18 1930    albuterol (PROVENTIL HFA;VENTOLIN HFA) 108 (90 Base) MCG/ACT inhaler  Every 6 hours PRN     05/15/18 1949    meloxicam (MOBIC) 15 MG tablet  Daily     05/15/18 1951              This chart was dictated using voice recognition software/Dragon. Despite best efforts to proofread, errors can occur which can change the meaning. Any change was purely unintentional.    Enid Derry, PA-C 05/15/18 2042    [facility] Semen, MD 05/15/18 2153

## 2018-05-15 NOTE — ED Notes (Signed)
US at bedside

## 2018-05-18 ENCOUNTER — Encounter: Payer: Self-pay | Admitting: Emergency Medicine

## 2018-05-18 ENCOUNTER — Other Ambulatory Visit: Payer: Self-pay

## 2018-05-18 ENCOUNTER — Emergency Department
Admission: EM | Admit: 2018-05-18 | Discharge: 2018-05-18 | Disposition: A | Discharge status: Home / Self Care | Payer: Self-pay | Attending: Emergency Medicine | Admitting: Emergency Medicine

## 2018-05-18 DIAGNOSIS — Z79899 Other long term (current) drug therapy: Secondary | ICD-10-CM | POA: Insufficient documentation

## 2018-05-18 DIAGNOSIS — I10 Essential (primary) hypertension: Secondary | ICD-10-CM | POA: Insufficient documentation

## 2018-05-18 DIAGNOSIS — M25562 Pain in left knee: Secondary | ICD-10-CM | POA: Insufficient documentation

## 2018-05-18 DIAGNOSIS — F17210 Nicotine dependence, cigarettes, uncomplicated: Secondary | ICD-10-CM | POA: Insufficient documentation

## 2018-05-18 NOTE — ED Triage Notes (Signed)
Pt reports pain to left knee since Sunday. Pt states was seen Monday, has an [place] that was negative and they told her to elevate and ice it and follow up. Pt states she has elevated it and iced it but is still hurts. Pt admits to not making a follow up appt as of yet.

## 2018-05-18 NOTE — Discharge Instructions (Addendum)
Your exam is consistent with a mild knee sprain or contusion. Wear the knee brace as needed. Apply ice and/or moist heat to reduce symptoms. Take the Meloxicam as prescribed. Follow-up with [facility] as needed.

## 2018-05-18 NOTE — ED Provider Notes (Signed)
[facility] Emergency Department Provider Note ____________________________________________  Time seen: 1157  I have reviewed the triage vital signs and the nursing notes.  HISTORY  Chief Complaint  Knee Pain   HPI Kristy Kirby is a 43 y.o. female returnsto the ED for evaluation of continued left knee pain. She was evaluated here for non-traumatic knee pain on Monday, 3 days prior. Her doppler was negative at that time. She was discharged with a prescription for meloxicam, which she admits, she has not filled. She was also held out of work until yesterday. When she was notified by her employer that she could not return to work on crutches, she returned here for re-evaluation. According to the patient, if she could not return to work on today, she would not have further employment. She returns, requesting an extension of her work note. She denies any interim injury.   Past Medical History:  Diagnosis Date  . Anxiety   . Depression   . HTN (hypertension)   . Seizures (HCC)     There are no active problems to display for this patient.   Past Surgical History:  Procedure Laterality Date  . TUBAL LIGATION      Prior to Admission medications   Medication Sig Start Date End Date Taking? Authorizing Provider  albuterol (PROVENTIL HFA;VENTOLIN HFA) 108 (90 Base) MCG/ACT inhaler Inhale 2 puffs into the lungs every 6 (six) hours as needed for wheezing or shortness of breath. 05/15/18   Enid Derry, PA-C  cyclobenzaprine (FLEXERIL) 5 MG tablet Take 1-2 tablets 3 times daily as needed 01/15/18   Enid Derry, PA-C  FLUoxetine (PROZAC) 40 MG capsule Take 40 mg by mouth daily.    [provider]  hydrochlorothiazide (HYDRODIURIL) 25 MG tablet Take 1 tablet (25 mg total) by mouth daily. 05/15/18   Enid Derry, PA-C  levETIRAcetam (KEPPRA) 500 MG tablet Take 1 tablet (500 mg total) by mouth 2 (two) times daily. 07/20/17 08/19/17  Loleta Rose, MD  lisinopril  (PRINIVIL,ZESTRIL) 20 MG tablet Take 1 tablet (20 mg total) by mouth daily. 05/15/18 05/15/19  Enid Derry, PA-C  meloxicam (MOBIC) 15 MG tablet Take 1 tablet (15 mg total) by mouth daily for 10 days. 05/15/18 05/25/18  Enid Derry, PA-C  metoprolol tartrate (LOPRESSOR) 25 MG tablet Take 1 tablet (25 mg total) by mouth 2 (two) times daily. 05/15/18   Enid Derry, PA-C  phenytoin (DILANTIN) 100 MG ER capsule Take 2 capsules (200 mg total) by mouth 3 (three) times daily. 07/20/17 07/20/18  Loleta Rose, MD  potassium chloride SA (KLOR-CON M20) 20 MEQ tablet Take 1 tablet (20 mEq total) by mouth daily. 07/20/17   Loleta Rose, MD    Allergies Banana; Coconut fatty acids; Penicillins; and Sulfa antibiotics  No family history on file.  Social History Social History   Tobacco Use  . Smoking status: Current Every Day Smoker    Packs/day: 0.50    Types: Cigarettes  . Smokeless tobacco: Never Used  Substance Use Topics  . Alcohol use: No  . Drug use: No    Review of Systems  Constitutional: Negative for fever. Cardiovascular: Negative for chest pain. Respiratory: Negative for shortness of breath. Musculoskeletal: Negative for back pain. Left knee pain Skin: Negative for rash. Neurological: Negative for headaches, focal weakness or numbness. ____________________________________________  PHYSICAL EXAM:  VITAL SIGNS: ED Triage Vitals  Enc Vitals Group     BP 05/18/18 1107 (!) 170/121     Pulse Rate 05/18/18 1107 89  Resp 05/18/18 1226 16     Temp 05/18/18 1107 98.9 F (37.2 C)     Temp Source 05/18/18 1107 Oral     SpO2 05/18/18 1107 98 %     Weight 05/18/18 1112 225 lb (102.1 kg)     Height 05/18/18 1112 5\' 2"  (1.575 m)     Head Circumference --      Peak Flow --      Pain Score 05/18/18 1112 10     Pain Loc --      Pain Edu? --      Excl. in GC? --     Constitutional: Alert and oriented. Well appearing and in no distress. Head: Normocephalic and atraumatic. Eyes:  Conjunctivae are normal. Normal extraocular movements Cardiovascular: Normal rate, regular rhythm. Normal distal pulses. Respiratory: Normal respiratory effort.  Musculoskeletal: left knee without obvious deformity, effusion, or dislocation. Patient demonstrates normal ROM without crepitus. She has normal patellar tracking without laxity or ballotment. No popliteal space fullness or tenderness elicited. Tender to palp over the medial collateral ligament. No frank valgus or varus joint line stress. No calf aor achilles tenderness noted distally.  Nontender with normal range of motion in all extremities.  Neurologic:  Normal gait without ataxia. Normal speech and language. No gross focal neurologic deficits are appreciated. Skin:  Skin is warm, dry and intact. No rash noted. ____________________________________________  PROCEDURES  Procedures ____________________________________________  INITIAL IMPRESSION / ASSESSMENT AND PLAN / ED COURSE  Patient with ED evaluation of left knee pain. She has admittedly not filled or taken the prescription meloxicam. She is discharged with instructions to start her NSAID immediately. She is given a work note, at her request, for 3 (additional) days. She is again referred to [facility] for ongoing symptoms.  ____________________________________________  FINAL CLINICAL IMPRESSION(S) / ED DIAGNOSES  Final diagnoses:  Medial knee pain, left     Karmen Stabs, Charlesetta Ivory, PA-C 05/18/18 1721    Don Perking, [place], MD 05/19/18 [phone]

## 2018-05-18 NOTE — ED Notes (Signed)
See triage note  States she was seen on Monday for pain to left knee  States pain started on Sunday w/o injury    States she had u/s done on Monday  Placed on mobic   States pain increased today when she got up to use the bathroom this am  States pain shot from knee into thigh

## 2018-06-05 ENCOUNTER — Emergency Department
Admission: EM | Admit: 2018-06-05 | Discharge: 2018-06-05 | Disposition: A | Discharge status: Home / Self Care | Payer: HRSA Program | Attending: Emergency Medicine | Admitting: Emergency Medicine

## 2018-06-05 ENCOUNTER — Other Ambulatory Visit: Payer: Self-pay

## 2018-06-05 ENCOUNTER — Emergency Department: Payer: HRSA Program

## 2018-06-05 DIAGNOSIS — J111 Influenza due to unidentified influenza virus with other respiratory manifestations: Secondary | ICD-10-CM

## 2018-06-05 DIAGNOSIS — F17210 Nicotine dependence, cigarettes, uncomplicated: Secondary | ICD-10-CM | POA: Insufficient documentation

## 2018-06-05 DIAGNOSIS — Z20828 Contact with and (suspected) exposure to other viral communicable diseases: Secondary | ICD-10-CM | POA: Insufficient documentation

## 2018-06-05 DIAGNOSIS — I10 Essential (primary) hypertension: Secondary | ICD-10-CM | POA: Insufficient documentation

## 2018-06-05 DIAGNOSIS — R0602 Shortness of breath: Secondary | ICD-10-CM | POA: Diagnosis present

## 2018-06-05 DIAGNOSIS — F419 Anxiety disorder, unspecified: Secondary | ICD-10-CM | POA: Insufficient documentation

## 2018-06-05 DIAGNOSIS — R05 Cough: Secondary | ICD-10-CM | POA: Diagnosis not present

## 2018-06-05 DIAGNOSIS — R69 Illness, unspecified: Secondary | ICD-10-CM

## 2018-06-05 LAB — CBC WITH DIFFERENTIAL/PLATELET
Abs Immature Granulocytes: 0.04 K/uL (ref 0.00–0.07)
Basophils Absolute: 0 K/uL (ref 0.0–0.1)
Basophils Relative: 0 %
Eosinophils Absolute: 0.1 K/uL (ref 0.0–0.5)
Eosinophils Relative: 1 %
HCT: 39.6 % (ref 36.0–46.0)
Hemoglobin: 13.1 g/dL (ref 12.0–15.0)
Immature Granulocytes: 0 %
Lymphocytes Relative: 28 %
Lymphs Abs: 3.2 K/uL (ref 0.7–4.0)
MCH: 30 pg (ref 26.0–34.0)
MCHC: 33.1 g/dL (ref 30.0–36.0)
MCV: 90.6 fL (ref 80.0–100.0)
Monocytes Absolute: 0.7 K/uL (ref 0.1–1.0)
Monocytes Relative: 6 %
Neutro Abs: 7.2 K/uL (ref 1.7–7.7)
Neutrophils Relative %: 65 %
Platelets: 226 K/uL (ref 150–400)
RBC: 4.37 MIL/uL (ref 3.87–5.11)
RDW: 15 % (ref 11.5–15.5)
WBC: 11.4 K/uL — ABNORMAL HIGH (ref 4.0–10.5)
nRBC: 0 % (ref 0.0–0.2)

## 2018-06-05 LAB — COMPREHENSIVE METABOLIC PANEL WITH GFR
ALT: 15 U/L (ref 0–44)
AST: 18 U/L (ref 15–41)
Albumin: 3.8 g/dL (ref 3.5–5.0)
Alkaline Phosphatase: 73 U/L (ref 38–126)
Anion gap: 11 (ref 5–15)
BUN: 16 mg/dL (ref 6–20)
CO2: 24 mmol/L (ref 22–32)
Calcium: 8.4 mg/dL — ABNORMAL LOW (ref 8.9–10.3)
Chloride: 103 mmol/L (ref 98–111)
Creatinine, Ser: 0.83 mg/dL (ref 0.44–1.00)
GFR calc Af Amer: >60 mL/min
GFR calc non Af Amer: >60 mL/min
Glucose, Bld: 104 mg/dL — ABNORMAL HIGH (ref 70–99)
Potassium: 3.4 mmol/L — ABNORMAL LOW (ref 3.5–5.1)
Sodium: 138 mmol/L (ref 135–145)
Total Bilirubin: 0.3 mg/dL (ref 0.3–1.2)
Total Protein: 7.2 g/dL (ref 6.5–8.1)

## 2018-06-05 LAB — FIBRIN DERIVATIVES D-DIMER (ARMC ONLY): Fibrin derivatives D-dimer (ARMC): 444.94 ng{FEU}/mL (ref 0.00–499.00)

## 2018-06-05 LAB — SARS CORONAVIRUS 2 BY RT PCR (HOSPITAL ORDER, PERFORMED IN ~~LOC~~ HOSPITAL LAB): SARS Coronavirus 2: NEGATIVE

## 2018-06-05 LAB — TROPONIN I: Troponin I: <0.03 ng/mL

## 2018-06-05 MED ORDER — PREDNISONE 50 MG PO TABS: ORAL_TABLET | ORAL | 0 refills | Status: DC

## 2018-06-05 MED ORDER — BENZONATATE 100 MG PO CAPS: 100.0000 mg | ORAL_CAPSULE | Freq: Three times a day (TID) | ORAL | 0 refills | Status: AC | PRN

## 2018-06-05 MED ORDER — ALBUTEROL SULFATE HFA 108 (90 BASE) MCG/ACT IN AERS: 2.0000 | INHALATION_SPRAY | Freq: Four times a day (QID) | RESPIRATORY_TRACT | 2 refills | Status: DC | PRN

## 2018-06-05 NOTE — ED Provider Notes (Addendum)
[facility] Emergency Department Provider Note  ____________________________________________  Time seen: Approximately 6:05 PM  I have reviewed the triage vital signs and the nursing notes.   HISTORY  Chief Complaint Cough    HPI Kristy Kirby is a 43 y.o. female with a history of hypertension and anxiety, presents to the emergency department with shortness of breath, pleuritic chest pain and cough for the past 4 days.  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  She denies nasal congestion or rhinorrhea.  She has had associated diarrhea and vomiting for the past 3 to 4 days.        Past Medical History:  Diagnosis Date  . Anxiety   . Depression   . HTN (hypertension)   . Seizures (HCC)     There are no active problems to display for this patient.   Past Surgical History:  Procedure Laterality Date  . TUBAL LIGATION      Prior to Admission medications   Medication Sig Start Date End Date Taking? Authorizing Provider  albuterol (VENTOLIN HFA) 108 (90 Base) MCG/ACT inhaler Inhale 2 puffs into the lungs every 6 (six) hours as needed for wheezing or shortness of breath. 06/05/18   Orvil Feil, PA-C  benzonatate (TESSALON PERLES) 100 MG capsule Take 1 capsule (100 mg total) by mouth 3 (three) times daily as needed for up to 7 days for cough. 06/05/18 06/12/18  Orvil Feil, PA-C  cyclobenzaprine (FLEXERIL) 5 MG tablet Take 1-2 tablets 3 times daily as needed 01/15/18   Enid Derry, PA-C  FLUoxetine (PROZAC) 40 MG capsule Take 40 mg by mouth daily.    [provider]  hydrochlorothiazide (HYDRODIURIL) 25 MG tablet Take 1 tablet (25 mg total) by mouth daily. 05/15/18   Enid Derry, PA-C  levETIRAcetam (KEPPRA) 500 MG tablet Take 1 tablet (500 mg total) by mouth 2 (two) times daily. 07/20/17 08/19/17  Loleta Rose, MD  lisinopril (PRINIVIL,ZESTRIL) 20 MG tablet Take 1  tablet (20 mg total) by mouth daily. 05/15/18 05/15/19  Enid Derry, PA-C  metoprolol tartrate (LOPRESSOR) 25 MG tablet Take 1 tablet (25 mg total) by mouth 2 (two) times daily. 05/15/18   Enid Derry, PA-C  phenytoin (DILANTIN) 100 MG ER capsule Take 2 capsules (200 mg total) by mouth 3 (three) times daily. 07/20/17 07/20/18  Loleta Rose, MD  potassium chloride SA (KLOR-CON M20) 20 MEQ tablet Take 1 tablet (20 mEq total) by mouth daily. 07/20/17   Loleta Rose, MD  predniSONE (DELTASONE) 50 MG tablet Take prednisone once daily for the next five days. 06/05/18   Orvil Feil, PA-C    Allergies Banana; Coconut fatty acids; Penicillins; and Sulfa antibiotics  No family history on file.  Social History Social History   Tobacco Use  . Smoking status: Current Every Day Smoker    Packs/day: 0.50    Types: Cigarettes  . Smokeless tobacco: Never Used  Substance Use Topics  . Alcohol use: No  . Drug use: No     Review of Systems  Constitutional: No fever/chills Eyes: No visual changes. No discharge ENT: No upper respiratory complaints. Cardiovascular: no chest pain. Respiratory: Patient has cough and SOB.  Gastrointestinal: No abdominal pain. Patient has vomiting. Patient has diarrhea. No constipation. Musculoskeletal: Negative for musculoskeletal pain. Skin: Negative for rash, abrasions, lacerations, ecchymosis. Neurological: Negative for headaches, focal weakness or numbness.   ___________________________________________   PHYSICAL EXAM:  VITAL SIGNS: ED Triage Vitals  Enc Vitals Group     BP 06/05/18 1740 (!) 167/116     Pulse Rate 06/05/18 1740 (!) 105     Resp 06/05/18 1740 18     Temp 06/05/18 1740 98.4 F (36.9 C)     Temp Source 06/05/18 1740 Oral     SpO2 06/05/18 1740 98 %     Weight 06/05/18 1743 220 lb (99.8 kg)     Height 06/05/18 1743  (1.575 m)     Head Circumference --      Peak Flow --      Pain Score 06/05/18 1742 8     Pain Loc --      Pain  Edu? --      Excl. in GC? --      Constitutional: Alert and oriented. Patient is lying supine. Eyes: Conjunctivae are normal. PERRL. EOMI. Head: Atraumatic. ENT:      Ears: Tympanic membranes are mildly injected with mild effusion bilaterally.       Nose: No congestion/rhinnorhea.      Mouth/Throat: Mucous membranes are moist. Posterior pharynx is mildly erythematous.  Hematological/Lymphatic/Immunilogical: No cervical lymphadenopathy.  Cardiovascular: Normal rate, regular rhythm. Normal S1 and S2.  Good peripheral circulation. Respiratory:  Lungs CTAB. Good air entry to the bases with no decreased or absent breath sounds. Gastrointestinal: Bowel sounds 4 quadrants. Soft and nontender to palpation. No guarding or rigidity. No palpable masses. No distention. No CVA tenderness. Musculoskeletal: Full range of motion to all extremities. No gross deformities appreciated. Neurologic:  Normal speech and language. No gross focal neurologic deficits are appreciated.  Skin:  Skin is warm, dry and intact. No rash noted. Psychiatric: Mood and affect are normal. Speech and behavior are normal. Patient exhibits appropriate insight and judgement.     ____________________________________________   LABS (all labs ordered are listed, but only abnormal results are displayed)  Labs Reviewed  CBC WITH DIFFERENTIAL/PLATELET - Abnormal; Notable for the following components:      Result Value   WBC 11.4 (*)    All other components within normal limits  COMPREHENSIVE METABOLIC PANEL - Abnormal; Notable for the following components:   Potassium 3.4 (*)    Glucose, Bld 104 (*)    Calcium 8.4 (*)    All other components within normal limits  SARS CORONAVIRUS 2 (HOSPITAL ORDER, PERFORMED IN [facility])  TROPONIN I  FIBRIN DERIVATIVES D-DIMER ([facility] ONLY)  POC URINE PREG, ED   ____________________________________________  EKG  Normal sinus rhythm without ST segment elevation or  apparent arrhythmia.  No T wave abnormalities. ____________________________________________  RADIOLOGY I personally viewed and evaluated these images as part of my medical decision making, as well as reviewing the written report by the radiologist.    Dg Chest Portable 1 View  Result Date: 06/05/2018 CLINICAL DATA:  Cough 4 days EXAM: PORTABLE CHEST 1 VIEW COMPARISON:  04/19/2018 FINDINGS: Prominent heart size, at least partially due to AP portable technique. Normal vascularity. Lungs are clear without infiltrate or effusion. IMPRESSION: No active disease. Electronically Signed   By: Marlan Palau M.D.   On: 06/05/2018 19:06    ____________________________________________    PROCEDURES  Procedure(s) performed:    Procedures    Medications - No data to display   ____________________________________________   INITIAL IMPRESSION / ASSESSMENT AND PLAN / ED COURSE  Pertinent labs & imaging results that were available during my care of the patient were reviewed by me and  considered in my medical decision making (see chart for details).  Review of the [place] CSRS was performed in accordance of the [facility] prior to dispensing any controlled drugs.           Assessment and plan: Influenza-like illness 43 year old female presents to the emergency department with cough and shortness of breath with associated diarrhea and emesis for the past 3 to 4 days  Patient was initially tachycardic.  On physical exam, patient seemed mildly short of breath but O2 sats were at 97%.  She was able to speak in complete sentences.  Differential diagnosis included PE, community-acquired pneumonia, COVID-19 and STEMI.  D-dimer conducted in the emergency department was within reference range.  No consolidations, opacities or infiltrates were identified on chest x-ray.  COVID-19 testing was negative in the emergency department.  EKG revealed normal sinus rhythm without ST segment elevation or apparent  arrhythmia.  No T wave abnormalities.  Troponin was also within reference range.  Patient was discharged with Jerilynn Somessalon Perles, albuterol inhaler and prednisone.  She was given strict return precautions to return to the emergency department for new or worsening symptoms.  All patient questions were answered.   ____________________________________________  FINAL CLINICAL IMPRESSION(S) / ED DIAGNOSES  Final diagnoses:  Influenza-like illness      NEW MEDICATIONS STARTED DURING THIS VISIT:  ED Discharge Orders         Ordered    benzonatate (TESSALON PERLES) 100 MG capsule  3 times daily PRN     06/05/18 1935    albuterol (VENTOLIN HFA) 108 (90 Base) MCG/ACT inhaler  Every 6 hours PRN,   Status:  Discontinued     06/05/18 1935    predniSONE (DELTASONE) 50 MG tablet     06/05/18 1935    albuterol (VENTOLIN HFA) 108 (90 Base) MCG/ACT inhaler  Every 6 hours PRN     06/05/18 1955              This chart was dictated using voice recognition software/Dragon. Despite best efforts to proofread, errors can occur which can change the meaning. Any change was purely unintentional.    Orvil FeilWoods, Jonne Rote M, PA-C 06/05/18 [address]2009    Chattie Greeson [place] Grove[place], Cordelia Poche-C 06/05/18 2118    Nita SickleVeronese, [place], MD 06/05/18 2216

## 2018-06-05 NOTE — ED Triage Notes (Signed)
Cough x 4 days

## 2018-09-01 ENCOUNTER — Other Ambulatory Visit: Payer: Self-pay

## 2018-09-01 DIAGNOSIS — Z20822 Contact with and (suspected) exposure to covid-19: Secondary | ICD-10-CM

## 2018-09-06 LAB — NOVEL CORONAVIRUS, NAA: SARS-CoV-2, NAA: NOT DETECTED

## 2018-10-06 ENCOUNTER — Encounter: Payer: Self-pay | Admitting: Emergency Medicine

## 2018-10-06 ENCOUNTER — Other Ambulatory Visit: Payer: Self-pay

## 2018-10-06 ENCOUNTER — Ambulatory Visit
Admission: EM | Admit: 2018-10-06 | Discharge: 2018-10-06 | Disposition: A | Discharge status: Home / Self Care | Payer: Worker's Compensation | Attending: Urgent Care | Admitting: Urgent Care

## 2018-10-06 DIAGNOSIS — Y990 Civilian activity done for income or pay: Secondary | ICD-10-CM

## 2018-10-06 DIAGNOSIS — M546 Pain in thoracic spine: Secondary | ICD-10-CM | POA: Diagnosis not present

## 2018-10-06 DIAGNOSIS — I10 Essential (primary) hypertension: Secondary | ICD-10-CM

## 2018-10-06 DIAGNOSIS — M62830 Muscle spasm of back: Secondary | ICD-10-CM

## 2018-10-06 MED ORDER — METHOCARBAMOL 750 MG PO TABS: 750.0000 mg | ORAL_TABLET | Freq: Two times a day (BID) | ORAL | 0 refills | Status: DC | PRN

## 2018-10-06 MED ORDER — NAPROXEN 500 MG PO TABS: 500.0000 mg | ORAL_TABLET | Freq: Two times a day (BID) | ORAL | 0 refills | Status: DC

## 2018-10-06 NOTE — ED Provider Notes (Signed)
[place], [place]   Name: Kristy Kirby DOB: [birth year] MRN: [id number] CSN: [id number] PCP: Patient, No Pcp Per  Arrival date and time:  10/06/18 1549  Chief Complaint:  Worker's Comp Injury and Back Pain   NOTE: Prior to seeing the patient today, I have reviewed the triage nursing documentation and vital signs. Clinical staff has updated patient's PMH/PSHx, current medication list, and drug allergies/intolerances to ensure comprehensive history available to assist in medical decision making.   History:   HPI: Kristy Kirby is a 43 y.o. female who presents today with complaints of pain in her back. Patient reports that she was performing her normal job duties today at [employer] when the injury occurred. 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 The cart reportedly stopped working due to issues with the battery. After taking care of the customer in need, patient made attempts to move the cary back to the front of the store for proper servicing. Patient states, "the only way that you can move it when the battery dies is to lift and pull it". When patient attempted to move the cart, she felt immediate pain in the middle of her back on the LEFT side. Patient denies radiation of her pain into her lower extremities. She has not appreciated any weakness or distal paraesthesias in her legs. Patient denies any acute issues with bowel or bladder incontinence. No saddle anesthesia. Injury occurred at approximately 0730 this morning. She made efforts to complete her shift, with plans to seek medical attention after work. Patient reports that she took 2 doses of naprosyn (last at 1500), which did not improve her pain. Patient advising that incident was reported to her supervisor, who recommended that she present to urgent care for further evaluation.  Past Medical History:  Diagnosis Date  . Anxiety   . Depression   . HTN (hypertension)   . Seizures (HCC)     Past Surgical  History:  Procedure Laterality Date  . TUBAL LIGATION      Family History  Problem Relation Age of Onset  . Hypertension Mother   . Diabetes Mother   . Hypertension Father   . Diabetes Father     Social History   Tobacco Use  . Smoking status: Current Every Day Smoker    Packs/day: 0.50    Types: Cigarettes  . Smokeless tobacco: Never Used  Substance Use Topics  . Alcohol use: No  . Drug use: No    There are no active problems to display for this patient.   Home Medications:    Current Meds  Medication Sig  . albuterol (VENTOLIN HFA) 108 (90 Base) MCG/ACT inhaler Inhale 2 puffs into the lungs every 6 (six) hours as needed for wheezing or shortness of breath.  . hydrochlorothiazide (HYDRODIURIL) 25 MG tablet Take 1 tablet (25 mg total) by mouth daily.  Marland Kitchen. lisinopril (PRINIVIL,ZESTRIL) 20 MG tablet Take 1 tablet (20 mg total) by mouth daily.  . metoprolol tartrate (LOPRESSOR) 25 MG tablet Take 1 tablet (25 mg total) by mouth 2 (two) times daily.  . potassium chloride SA (KLOR-CON M20) 20 MEQ tablet Take 1 tablet (20 mEq total) by mouth daily.    Allergies:   Banana, Coconut fatty acids, Penicillins, and Sulfa antibiotics  Review of Systems (ROS): Review of Systems  Constitutional: Negative for chills and fever.  Respiratory: Negative for cough and shortness of breath.   Cardiovascular: Negative for chest pain and palpitations.  Musculoskeletal: Positive  for back pain. Negative for gait problem.  Skin: Negative for color change, pallor and rash.  Neurological: Negative for dizziness, syncope, weakness and headaches.  All other systems reviewed and are negative.    Vital Signs: Today's Vitals   10/06/18 1600 10/06/18 1604 10/06/18 1650 10/06/18 1703  BP:  (S) (!) 185/125 (!) 203/131   Pulse:  88    Resp:  16    Temp:  98.4 F (36.9 C)    TempSrc:  Oral    SpO2:  100%    Weight: 220 lb 0.3 oz (99.8 kg)     Height: 5\' 2"  (1.575 m)     PainSc: 9    9      Physical Exam: Physical Exam  Constitutional: She is oriented to person, place, and time and well-developed, well-nourished, and in no distress. She appears to be writhing in pain.  HENT:  Head: Normocephalic and atraumatic.  Mouth/Throat: Mucous membranes are normal.  Cardiovascular: Normal rate, regular rhythm, normal heart sounds and intact distal pulses. Exam reveals no gallop and no friction rub.  No murmur heard. Pulmonary/Chest: Effort normal and breath sounds normal. No respiratory distress. She has no wheezes. She has no rales.  Musculoskeletal:     Right shoulder: She exhibits tenderness, pain and spasm.       Arms:     Comments: No midline pain or deformities.   Neurological: She is alert and oriented to person, place, and time. She has normal sensation, normal strength and normal reflexes. Gait normal. GCS score is 15.  Skin: Skin is warm and dry. No rash noted.  Psychiatric: Memory, affect and judgment normal. Her mood appears anxious.  Nursing note and vitals reviewed.   Urgent Care Treatments / Results:   LABS: PLEASE NOTE: all labs that were ordered this encounter are listed, however only abnormal results are displayed. Labs Reviewed - No data to display  EKG: -None  RADIOLOGY: No results found.  PROCEDURES: Procedures  MEDICATIONS RECEIVED THIS VISIT: Medications - No data to display  PERTINENT CLINICAL COURSE NOTES/UPDATES:   Initial Impression / Assessment and Plan / Urgent Care Course:  Pertinent labs & imaging results that were available during my care of the patient were personally reviewed by me and considered in my medical decision making (see lab/imaging section of note for values and interpretations).  Kristy Kirby is a 43 y.o. female who [facility] with complaints of Worker's Comp Injury and Back Pain   Patient presents to clinic in obvious discomfort. She does not appear to be in any acute distress. Presenting  symptoms (see HPI) and exam as documented above. Symptoms and exam consistent with thoracic muscle strain with spasm in the LEFT mid-thoracic region of patients back. Will pursue treatment using anti-inflammatory (naprosyn) medication and skeletal muscle relaxer (methocarbamol). She was educated on complimentary modalities to help with her pain. Patient encouraged to rest and avoid twisting/bending/lifting. She will likely find added benefit of applying moist TID for at least 10-15 minutes at a time; written information provided on today's AVS.  Patient needs to be seen for further evaluation by occupational medicine for RTW clearance. Appointment was made for patient with [facility]: Lambert Rhonda WMcManama, NP for Monday (10/09/2018) at 1430 pm. Name and office contact information provided on today's AVS.  Due to the nature of her injury, there is no way that the patient can work over the week. She was provided with the appropriate documentation to provide to her place of  employment that will allow for her to remain out of work until seen by occupational medicine on Monday.    Patient presents to the clinic today with an elevated blood pressure of 185/125. She has been out of several of her antihypertensives for over a month. Patient advising that she is waiting for her insurance to take effect before seeing her PCP for refills. BP elevated due to her not taking her prescribed medications and quite certainly due to her medications. Recheck of blood pressure was even higher at 203/131. Discussed need for immediate treatment of her blood pressure in the emergency department. Patient to leave clinic today and go directly to [facility] for further evaluation and treatment.   Final Clinical Impressions / Urgent Care Diagnoses:   Final diagnoses:  Acute left-sided thoracic back pain  Muscle spasm of back  Work related injury  Essential hypertension    New Prescriptions:  [place] Controlled Substance Registry consulted? Not Applicable  Meds  ordered this encounter  Medications  . naproxen (NAPROSYN) 500 MG tablet    Sig: Take 1 tablet (500 mg total) by mouth 2 (two) times daily.    Dispense:  30 tablet    Refill:  0  . methocarbamol (ROBAXIN) 750 MG tablet    Sig: Take 1 tablet (750 mg total) by mouth 2 (two) times daily as needed for muscle spasms.    Dispense:  10 tablet    Refill:  0    Recommended Follow up Care:  Patient encouraged to follow up with the following provider within the specified time frame, or sooner as dictated by the severity of her symptoms. As always, she was instructed that for any urgent/emergent care needs, she should seek care either here or in the emergency department for more immediate evaluation.  Follow-up Information    Call  Maximino Sarin, FNP.   Specialty: Family Medicine Why: You have an appointment on Monday at 2:30 pm Contact information: [place]  [id number] [phone]        Go to  [facility].   Specialty: Emergency Medicine Why: Leave clinic today and go directly to the hospital for evalaution and treatment of yoru blood pressure. Contact information: [place] [id number] [address] [facility] [place] [phone]        NOTE: This note was prepared using Dragon dictation software along with smaller phrase technology. Despite my best ability to proofread, there is the potential that transcriptional errors may still occur from this process, and are completely unintentional.    Karen Kitchens, NP 10/06/18 1757

## 2018-10-06 NOTE — Discharge Instructions (Signed)
It was very nice seeing you today in clinic. Thank you for entrusting me with your care.   As discussed, your pain seems to be musculoskeletal in nature. Plans for treating you are as follows:  Please utilize the medications that we discussed. Your prescriptions have been called in to your pharmacy.  Avoid overdoing it, but you need to make efforts to remain active as tolerated.  Avoiding activity all together can make your pain worse. You may find that alternating between ice and moist heat application will help with your pain.  Heat/ice should be applied for 10-15 minutes at a time at least 3-4 times a day.  Make arrangements to follow up with occupational medicine. I have provided you with the name of the provider that you will need to see, If your symptoms/condition worsens, please seek follow up care either here or in the ER. Please remember, our [place] providers are "right here with you" when you need [place].   Again, it was my pleasure to take care of you today. Thank you for choosing our clinic. I hope that you start to feel better quickly.   Honor Loh, MSN, APRN, FNP-C, CEN Advanced Practice Provider [facility]

## 2018-10-06 NOTE — ED Triage Notes (Signed)
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 Patient c/o mid back pain.

## 2019-01-06 ENCOUNTER — Other Ambulatory Visit: Payer: Self-pay

## 2019-01-06 ENCOUNTER — Emergency Department
Admission: EM | Admit: 2019-01-06 | Discharge: 2019-01-06 | Disposition: A | Discharge status: Home / Self Care | Payer: Self-pay | Attending: Student in an Organized Health Care Education/Training Program | Admitting: Student in an Organized Health Care Education/Training Program

## 2019-01-06 ENCOUNTER — Encounter: Payer: Self-pay | Admitting: Emergency Medicine

## 2019-01-06 DIAGNOSIS — T148XXA Other injury of unspecified body region, initial encounter: Secondary | ICD-10-CM

## 2019-01-06 DIAGNOSIS — Y999 Unspecified external cause status: Secondary | ICD-10-CM | POA: Insufficient documentation

## 2019-01-06 DIAGNOSIS — Y929 Unspecified place or not applicable: Secondary | ICD-10-CM | POA: Insufficient documentation

## 2019-01-06 DIAGNOSIS — Y939 Activity, unspecified: Secondary | ICD-10-CM | POA: Insufficient documentation

## 2019-01-06 DIAGNOSIS — F17210 Nicotine dependence, cigarettes, uncomplicated: Secondary | ICD-10-CM | POA: Insufficient documentation

## 2019-01-06 DIAGNOSIS — S339XXA Sprain of unspecified parts of lumbar spine and pelvis, initial encounter: Secondary | ICD-10-CM | POA: Insufficient documentation

## 2019-01-06 DIAGNOSIS — X500XXA Overexertion from strenuous movement or load, initial encounter: Secondary | ICD-10-CM | POA: Insufficient documentation

## 2019-01-06 DIAGNOSIS — Z79899 Other long term (current) drug therapy: Secondary | ICD-10-CM | POA: Insufficient documentation

## 2019-01-06 DIAGNOSIS — I10 Essential (primary) hypertension: Secondary | ICD-10-CM | POA: Insufficient documentation

## 2019-01-06 MED ORDER — ORPHENADRINE CITRATE 30 MG/ML IJ SOLN
60.0000 mg | Freq: Two times a day (BID) | INTRAMUSCULAR | Status: DC
  Administered 2019-01-06: 60 mg via INTRAMUSCULAR
  Filled 2019-01-06: qty 2

## 2019-01-06 MED ORDER — TRAMADOL HCL 50 MG PO TABS: 50.0000 mg | ORAL_TABLET | Freq: Two times a day (BID) | ORAL | 0 refills | Status: DC | PRN

## 2019-01-06 MED ORDER — CYCLOBENZAPRINE HCL 10 MG PO TABS: 10.0000 mg | ORAL_TABLET | Freq: Three times a day (TID) | ORAL | 0 refills | Status: DC | PRN

## 2019-01-06 MED ORDER — HYDROMORPHONE HCL 1 MG/ML IJ SOLN
1.0000 mg | Freq: Once | INTRAMUSCULAR | Status: AC
  Administered 2019-01-06: 1 mg via INTRAMUSCULAR
  Filled 2019-01-06: qty 1

## 2019-01-06 NOTE — ED Triage Notes (Signed)
FIRST NURSE NOTE:  Pt reports back pain, unsure why, states she had to leave work because of the pain. Pt is ambulatory in triage without difficulty.   Pt states this is not a work related injury.

## 2019-01-06 NOTE — Discharge Instructions (Addendum)
Follow discharge care instruction take medication as directed.  Medication may cause drowsiness. °

## 2019-01-06 NOTE — ED Triage Notes (Signed)
Mid back pain began yesterday. Worse with movement.

## 2019-01-06 NOTE — ED Provider Notes (Signed)
[facility] Emergency Department Provider Note   ____________________________________________   First MD Initiated Contact with Patient 01/06/19 1442     (approximate)  I have reviewed the triage vital signs and the nursing notes.   HISTORY  Chief Complaint Back Pain    HPI Kristy Kirby is a 43 y.o. female patient complain of acute left lateral mid back pain secondary to a lesser extent at work. Patient states she bent over to pick up her frozen [place] and upon straightening up experienced acute back pain. Patient denies radicular component to her back pain. Patient denies chest or abdominal pain. Patient state there is no bladder or bowel dysfunction. Patient states had a similar incident which was milder yesterday. Notice patient blood pressure is elevated. Patient has a history of hypertension but did not take blood pressure medicines today. Patient rates pain as a 10/10. Patient described the pain as "aching".         Past Medical History:  Diagnosis Date  . Anxiety   . Depression   . HTN (hypertension)   . Seizures (HCC)     There are no active problems to display for this patient.   Past Surgical History:  Procedure Laterality Date  . TUBAL LIGATION      Prior to Admission medications   Medication Sig Start Date End Date Taking? Authorizing Provider  albuterol (VENTOLIN HFA) 108 (90 Base) MCG/ACT inhaler Inhale 2 puffs into the lungs every 6 (six) hours as needed for wheezing or shortness of breath. 06/05/18   Orvil Feil, PA-C  cyclobenzaprine (FLEXERIL) 10 MG tablet Take 1 tablet (10 mg total) by mouth 3 (three) times daily as needed. 01/06/19   Joni Reining, PA-C  hydrochlorothiazide (HYDRODIURIL) 25 MG tablet Take 1 tablet (25 mg total) by mouth daily. 05/15/18   Enid Derry, PA-C  levETIRAcetam (KEPPRA) 500 MG tablet Take 1 tablet (500 mg total) by mouth 2 (two) times daily. 07/20/17 08/19/17  Loleta Rose, MD  lisinopril  (PRINIVIL,ZESTRIL) 20 MG tablet Take 1 tablet (20 mg total) by mouth daily. 05/15/18 05/15/19  Enid Derry, PA-C  methocarbamol (ROBAXIN) 750 MG tablet Take 1 tablet (750 mg total) by mouth 2 (two) times daily as needed for muscle spasms. 10/06/18   Verlee Monte, NP  metoprolol tartrate (LOPRESSOR) 25 MG tablet Take 1 tablet (25 mg total) by mouth 2 (two) times daily. 05/15/18   Enid Derry, PA-C  naproxen (NAPROSYN) 500 MG tablet Take 1 tablet (500 mg total) by mouth 2 (two) times daily. 10/06/18   Verlee Monte, NP  phenytoin (DILANTIN) 100 MG ER capsule Take 2 capsules (200 mg total) by mouth 3 (three) times daily. 07/20/17 07/20/18  Loleta Rose, MD  potassium chloride SA (KLOR-CON M20) 20 MEQ tablet Take 1 tablet (20 mEq total) by mouth daily. 07/20/17   Loleta Rose, MD  traMADol (ULTRAM) 50 MG tablet Take 1 tablet (50 mg total) by mouth every 12 (twelve) hours as needed. 01/06/19   Joni Reining, PA-C  FLUoxetine (PROZAC) 40 MG capsule Take 40 mg by mouth daily.  10/06/18  [provider]    Allergies Banana, Coconut fatty acids, Penicillins, and Sulfa antibiotics  Family History  Problem Relation Age of Onset  . Hypertension Mother   . Diabetes Mother   . Hypertension Father   . Diabetes Father     Social History Social History   Tobacco Use  . Smoking status: Current Every Day Smoker  Packs/day: 0.50    Types: Cigarettes  . Smokeless tobacco: Never Used  Substance Use Topics  . Alcohol use: No  . Drug use: No    Review of Systems Constitutional: No fever/chills Eyes: No visual changes. ENT: No sore throat. Cardiovascular: Denies chest pain. Respiratory: Denies shortness of breath. Gastrointestinal: No abdominal pain.  No nausea, no vomiting.  No diarrhea.  No constipation. Genitourinary: Negative for dysuria. Musculoskeletal: Positive for back pain. Skin: Negative for rash. Neurological: Negative for headaches, focal weakness or numbness. Psychiatric:   Anxiety Endocrine:  Hypertension Allergic/Immunilogical: Banana, coconut, penicillin, and sulfur antibiotic.  ____________________________________________   PHYSICAL EXAM:  VITAL SIGNS: ED Triage Vitals [01/06/19 1343]  Enc Vitals Group     BP (!) 196/113     Pulse Rate 86     Resp 20     Temp 98.3 F (36.8 C)     Temp Source Oral     SpO2 100 %     Weight 240 lb (108.9 kg)     Height 5\' 3"  (1.6 m)     Head Circumference      Peak Flow      Pain Score 10     Pain Loc      Pain Edu?      Excl. in [place]?     Constitutional: Alert and oriented. Moderate distress. Tearful. Neck: No cervical spine tenderness to palpation. Cardiovascular: Normal rate, regular rhythm. Grossly normal heart sounds.  Good peripheral circulation. Respiratory: Normal respiratory effort.  No retractions. Lungs CTAB. Gastrointestinal: Soft and nontender. No distention. No abdominal bruits. No CVA tenderness. Genitourinary: Deferred Musculoskeletal: No lower extremity tenderness nor edema.  No joint effusions. Neurologic:  Normal speech and language. No gross focal neurologic deficits are appreciated. No gait instability. Skin:  Skin is warm, dry and intact. No rash noted. Psychiatric: Mood and affect are normal. Speech and behavior are normal.  ____________________________________________   LABS (all labs ordered are listed, but only abnormal results are displayed)  Labs Reviewed - No data to display ____________________________________________  EKG   ____________________________________________  RADIOLOGY  ED MD interpretation:    Official radiology report(s): No results found.  ____________________________________________   PROCEDURES  Procedure(s) performed (including Critical Care):  Procedures   ____________________________________________   INITIAL IMPRESSION / ASSESSMENT AND PLAN / ED COURSE  As part of my medical decision making, I reviewed the following data within  the [place]     Patient presented cute onset of low back pain secondary to a flexion incident which she had left a heavy [place].  Patient physical exam consistent with muscle strain.  Patient responded well to Dilaudid and Norflex.  Patient given discharge care instructions, dull, and advised to take medication as directed.  Patient advised to establish care with the [facility] community [facility].    Kristy Kirby was evaluated in Emergency Department on 01/06/2019 for the symptoms described in the history of present illness. She was evaluated in the context of the global COVID-19 pandemic, which necessitated consideration that the patient might be at risk for infection with the SARS-CoV-2 virus that causes COVID-19. Institutional protocols and algorithms that pertain to the evaluation of patients at risk for COVID-19 are in a state of rapid change based on information released by regulatory bodies including the CDC and federal and state organizations. These policies and algorithms were followed during the patient's care in the ED.       ____________________________________________   FINAL CLINICAL IMPRESSION(S) /  ED DIAGNOSES  Final diagnoses:  Muscle strain     ED Discharge Orders         Ordered    cyclobenzaprine (FLEXERIL) 10 MG tablet  3 times daily PRN     01/06/19 1545    traMADol (ULTRAM) 50 MG tablet  Every 12 hours PRN     01/06/19 1545           Note:  This document was prepared using Dragon voice recognition software and may include unintentional dictation errors.    Joni ReiningSmith, Chrysten Woulfe K, PA-C 01/06/19 1553    Willy Eddyobinson, Patrick, MD 01/07/19 [phone]

## 2019-01-06 NOTE — ED Notes (Signed)
Pt presents to the ED from work after experiencing sharp lower back pain. Pt denies CP, SOB, and NVD.

## 2019-02-22 ENCOUNTER — Other Ambulatory Visit: Payer: Self-pay | Admitting: Physician Assistant

## 2019-02-22 ENCOUNTER — Other Ambulatory Visit: Payer: Self-pay

## 2019-02-22 ENCOUNTER — Encounter: Payer: Self-pay | Admitting: *Deleted

## 2019-02-22 ENCOUNTER — Emergency Department
Admission: EM | Admit: 2019-02-22 | Discharge: 2019-02-22 | Disposition: A | Discharge status: Home / Self Care | Payer: Self-pay | Attending: Emergency Medicine | Admitting: Emergency Medicine

## 2019-02-22 DIAGNOSIS — R0602 Shortness of breath: Secondary | ICD-10-CM | POA: Insufficient documentation

## 2019-02-22 DIAGNOSIS — R0789 Other chest pain: Secondary | ICD-10-CM | POA: Insufficient documentation

## 2019-02-22 DIAGNOSIS — Z5321 Procedure and treatment not carried out due to patient leaving prior to being seen by health care provider: Secondary | ICD-10-CM | POA: Insufficient documentation

## 2019-02-22 LAB — BASIC METABOLIC PANEL
Anion gap: 10 (ref 5–15)
BUN: 20 mg/dL (ref 6–20)
CO2: 24 mmol/L (ref 22–32)
Calcium: 9.1 mg/dL (ref 8.9–10.3)
Chloride: 106 mmol/L (ref 98–111)
Creatinine, Ser: 1.13 mg/dL — ABNORMAL HIGH (ref 0.44–1.00)
GFR calc Af Amer: >60 mL/min (ref >60)
GFR calc non Af Amer: 59 mL/min — ABNORMAL LOW (ref >60)
Glucose, Bld: 133 mg/dL — ABNORMAL HIGH (ref 70–99)
Potassium: 3.8 mmol/L (ref 3.5–5.1)
Sodium: 140 mmol/L (ref 135–145)

## 2019-02-22 LAB — CBC
HCT: 45.2 % (ref 36.0–46.0)
Hemoglobin: 15.2 g/dL — ABNORMAL HIGH (ref 12.0–15.0)
MCH: 30.3 pg (ref 26.0–34.0)
MCHC: 33.6 g/dL (ref 30.0–36.0)
MCV: 90 fL (ref 80.0–100.0)
Platelets: 238 10*3/uL (ref 150–400)
RBC: 5.02 MIL/uL (ref 3.87–5.11)
RDW: 14.8 % (ref 11.5–15.5)
WBC: 14.9 10*3/uL — ABNORMAL HIGH (ref 4.0–10.5)
nRBC: 0 % (ref 0.0–0.2)

## 2019-02-22 LAB — TROPONIN I (HIGH SENSITIVITY): Troponin I (High Sensitivity): 5 ng/L (ref <18)

## 2019-02-22 NOTE — ED Notes (Signed)
Called and no answer x 3 from lobby.  Unable to locate patient

## 2019-02-22 NOTE — ED Triage Notes (Addendum)
Pt ambulatory to triage.  Pt has chest pain, sob, diarrhea and headache for 2 days.  Pt also reports bodyaches and a cough.  Hx cig smoking.  Pt alert   Speech clear.

## 2019-02-26 ENCOUNTER — Ambulatory Visit: Payer: BC Managed Care – PPO | Attending: Internal Medicine

## 2019-02-26 ENCOUNTER — Telehealth: Payer: Self-pay | Admitting: Emergency Medicine

## 2019-02-26 DIAGNOSIS — Z20822 Contact with and (suspected) exposure to covid-19: Secondary | ICD-10-CM

## 2019-02-26 NOTE — Telephone Encounter (Signed)
Called patient due to lwot to inquire about condition and follow up plans.  Says she has an appt for covid test today.  Continues to have fatigue, but she says she has been taking fluids well.

## 2019-02-27 LAB — NOVEL CORONAVIRUS, NAA: SARS-CoV-2, NAA: NOT DETECTED

## 2019-06-29 ENCOUNTER — Emergency Department: Payer: PRIVATE HEALTH INSURANCE

## 2019-06-29 ENCOUNTER — Emergency Department
Admission: EM | Admit: 2019-06-29 | Discharge: 2019-06-29 | Disposition: A | Discharge status: Home / Self Care | Payer: PRIVATE HEALTH INSURANCE | Attending: Emergency Medicine | Admitting: Emergency Medicine

## 2019-06-29 ENCOUNTER — Other Ambulatory Visit: Payer: Self-pay

## 2019-06-29 ENCOUNTER — Encounter: Payer: Self-pay | Admitting: Psychiatry

## 2019-06-29 DIAGNOSIS — R519 Headache, unspecified: Secondary | ICD-10-CM | POA: Insufficient documentation

## 2019-06-29 DIAGNOSIS — I10 Essential (primary) hypertension: Secondary | ICD-10-CM | POA: Insufficient documentation

## 2019-06-29 DIAGNOSIS — Z760 Encounter for issue of repeat prescription: Secondary | ICD-10-CM | POA: Insufficient documentation

## 2019-06-29 DIAGNOSIS — Z87891 Personal history of nicotine dependence: Secondary | ICD-10-CM | POA: Insufficient documentation

## 2019-06-29 DIAGNOSIS — R569 Unspecified convulsions: Secondary | ICD-10-CM | POA: Insufficient documentation

## 2019-06-29 DIAGNOSIS — R32 Unspecified urinary incontinence: Secondary | ICD-10-CM | POA: Insufficient documentation

## 2019-06-29 DIAGNOSIS — R42 Dizziness and giddiness: Secondary | ICD-10-CM | POA: Insufficient documentation

## 2019-06-29 DIAGNOSIS — I160 Hypertensive urgency: Secondary | ICD-10-CM

## 2019-06-29 LAB — CBC WITH DIFFERENTIAL/PLATELET
Abs Immature Granulocytes: 0.04 10*3/uL (ref 0.00–0.07)
Basophils Absolute: 0.1 10*3/uL (ref 0.0–0.1)
Basophils Relative: 0 %
Eosinophils Absolute: 0.2 10*3/uL (ref 0.0–0.5)
Eosinophils Relative: 1 %
HCT: 43 % (ref 36.0–46.0)
Hemoglobin: 14.6 g/dL (ref 12.0–15.0)
Immature Granulocytes: 0 %
Lymphocytes Relative: 24 %
Lymphs Abs: 3.3 10*3/uL (ref 0.7–4.0)
MCH: 29.7 pg (ref 26.0–34.0)
MCHC: 34 g/dL (ref 30.0–36.0)
MCV: 87.4 fL (ref 80.0–100.0)
Monocytes Absolute: 0.9 10*3/uL (ref 0.1–1.0)
Monocytes Relative: 7 %
Neutro Abs: 9.2 10*3/uL — ABNORMAL HIGH (ref 1.7–7.7)
Neutrophils Relative %: 68 %
Platelets: 230 10*3/uL (ref 150–400)
RBC: 4.92 MIL/uL (ref 3.87–5.11)
RDW: 14.7 % (ref 11.5–15.5)
WBC: 13.8 10*3/uL — ABNORMAL HIGH (ref 4.0–10.5)
nRBC: 0 % (ref 0.0–0.2)

## 2019-06-29 LAB — COMPREHENSIVE METABOLIC PANEL
ALT: 20 U/L (ref 0–44)
AST: 22 U/L (ref 15–41)
Albumin: 4.1 g/dL (ref 3.5–5.0)
Alkaline Phosphatase: 67 U/L (ref 38–126)
Anion gap: 8 (ref 5–15)
BUN: 14 mg/dL (ref 6–20)
CO2: 27 mmol/L (ref 22–32)
Calcium: 9 mg/dL (ref 8.9–10.3)
Chloride: 105 mmol/L (ref 98–111)
Creatinine, Ser: 0.81 mg/dL (ref 0.44–1.00)
GFR calc Af Amer: >60 mL/min (ref >60)
GFR calc non Af Amer: >60 mL/min (ref >60)
Glucose, Bld: 101 mg/dL — ABNORMAL HIGH (ref 70–99)
Potassium: 3.6 mmol/L (ref 3.5–5.1)
Sodium: 140 mmol/L (ref 135–145)
Total Bilirubin: 0.9 mg/dL (ref 0.3–1.2)
Total Protein: 7.8 g/dL (ref 6.5–8.1)

## 2019-06-29 LAB — URINALYSIS, COMPLETE (UACMP) WITH MICROSCOPIC
Bacteria, UA: NONE SEEN
Bilirubin Urine: NEGATIVE
Glucose, UA: NEGATIVE mg/dL
Ketones, ur: NEGATIVE mg/dL
Leukocytes,Ua: NEGATIVE
Nitrite: NEGATIVE
Protein, ur: NEGATIVE mg/dL
Specific Gravity, Urine: 1.008 (ref 1.005–1.030)
pH: 6 (ref 5.0–8.0)

## 2019-06-29 LAB — GLUCOSE, CAPILLARY: Glucose-Capillary: 73 mg/dL (ref 70–99)

## 2019-06-29 MED ORDER — DIPHENHYDRAMINE HCL 50 MG/ML IJ SOLN
INTRAMUSCULAR | Status: AC
  Administered 2019-06-29: 25 mg via INTRAVENOUS
  Filled 2019-06-29: qty 1

## 2019-06-29 MED ORDER — METOPROLOL TARTRATE 25 MG PO TABS
25.0000 mg | ORAL_TABLET | Freq: Once | ORAL | Status: AC
  Administered 2019-06-29: 25 mg via ORAL
  Filled 2019-06-29: qty 1

## 2019-06-29 MED ORDER — KETOROLAC TROMETHAMINE 30 MG/ML IJ SOLN
30.0000 mg | Freq: Once | INTRAMUSCULAR | Status: AC
  Administered 2019-06-29: 30 mg via INTRAVENOUS
  Filled 2019-06-29: qty 1

## 2019-06-29 MED ORDER — LOSARTAN POTASSIUM 25 MG PO TABS
25.0000 mg | ORAL_TABLET | Freq: Every day | ORAL | 11 refills | Status: DC
Start: 2019-06-29 — End: 2019-09-13

## 2019-06-29 MED ORDER — DIPHENHYDRAMINE HCL 50 MG/ML IJ SOLN: 25.0000 mg | Freq: Once | INTRAMUSCULAR | Status: AC

## 2019-06-29 MED ORDER — LORAZEPAM 2 MG/ML IJ SOLN
1.0000 mg | Freq: Once | INTRAMUSCULAR | Status: AC
  Administered 2019-06-29: 1 mg via INTRAVENOUS
  Filled 2019-06-29: qty 1

## 2019-06-29 MED ORDER — HYDROCHLOROTHIAZIDE 25 MG PO TABS: 25.0000 mg | ORAL_TABLET | Freq: Every day | ORAL | 2 refills | Status: DC

## 2019-06-29 MED ORDER — METOPROLOL TARTRATE 25 MG PO TABS: 25.0000 mg | ORAL_TABLET | Freq: Two times a day (BID) | ORAL | 2 refills | Status: DC

## 2019-06-29 MED ORDER — LOSARTAN POTASSIUM 50 MG PO TABS
25.0000 mg | ORAL_TABLET | Freq: Once | ORAL | Status: AC
  Administered 2019-06-29: 25 mg via ORAL
  Filled 2019-06-29: qty 1

## 2019-06-29 MED ORDER — MELOXICAM 7.5 MG PO TABS
7.5000 mg | ORAL_TABLET | Freq: Every day | ORAL | 2 refills | Status: DC
Start: 2019-06-29 — End: 2019-09-28

## 2019-06-29 MED ORDER — HYDROCHLOROTHIAZIDE 25 MG PO TABS
25.0000 mg | ORAL_TABLET | Freq: Every day | ORAL | Status: DC
  Administered 2019-06-29: 25 mg via ORAL
  Filled 2019-06-29: qty 1

## 2019-06-29 MED ORDER — PHENYTOIN SODIUM EXTENDED 100 MG PO CAPS: 300.0000 mg | ORAL_CAPSULE | Freq: Two times a day (BID) | ORAL | 3 refills | Status: DC

## 2019-06-29 MED ORDER — SODIUM CHLORIDE 0.9 % IV SOLN
1000.0000 mg | Freq: Once | INTRAVENOUS | Status: AC
  Administered 2019-06-29: 1000 mg via INTRAVENOUS
  Filled 2019-06-29: qty 20

## 2019-06-29 NOTE — ED Provider Notes (Signed)
[facility] Emergency Department Provider Note  ___________________________________________   First MD Initiated Contact with Patient 06/29/19 1117    I have reviewed the triage vital signs and the nursing notes.   HISTORY  Chief Complaint Seizures and Hypertension  HPI Kristy Kirby is a 44 y.o. female who presents to the ER after having 3 seizures at home.  Patient states she was having a headache and was noted to be hypertensive at her place of work.  She was then sent home and had subsequent seizures.  She states she has been out of her Dilantin for the last month due to insurance reasons.  She still complains of headache and dizziness.  Denies any other focal complaints.  She did not bite her tongue but did urinate on herself.     Past Medical History:  Diagnosis Date  . Anxiety   . Depression   . HTN (hypertension)   . Seizures (HCC)     There are no problems to display for this patient.   Past Surgical History:  Procedure Laterality Date  . TUBAL LIGATION      Prior to Admission medications   Medication Sig Start Date End Date Taking? Authorizing Provider  albuterol (VENTOLIN HFA) 108 (90 Base) MCG/ACT inhaler Inhale 2 puffs into the lungs every 6 (six) hours as needed for wheezing or shortness of breath. 06/05/18   Orvil Feil, PA-C  cyclobenzaprine (FLEXERIL) 10 MG tablet Take 1 tablet (10 mg total) by mouth 3 (three) times daily as needed. 01/06/19   Joni Reining, PA-C  hydrochlorothiazide (HYDRODIURIL) 25 MG tablet Take 1 tablet (25 mg total) by mouth daily. 05/15/18   Enid Derry, PA-C  levETIRAcetam (KEPPRA) 500 MG tablet Take 1 tablet (500 mg total) by mouth 2 (two) times daily. 07/20/17 08/19/17  Loleta Rose, MD  lisinopril (PRINIVIL,ZESTRIL) 20 MG tablet Take 1 tablet (20 mg total) by mouth daily. 05/15/18 05/15/19  Enid Derry, PA-C  methocarbamol (ROBAXIN) 750 MG tablet Take 1 tablet (750 mg total) by mouth 2 (two) times  daily as needed for muscle spasms. 10/06/18   Verlee Monte, NP  metoprolol tartrate (LOPRESSOR) 25 MG tablet Take 1 tablet (25 mg total) by mouth 2 (two) times daily. 05/15/18   Enid Derry, PA-C  naproxen (NAPROSYN) 500 MG tablet Take 1 tablet (500 mg total) by mouth 2 (two) times daily. 10/06/18   Verlee Monte, NP  phenytoin (DILANTIN) 100 MG ER capsule Take 2 capsules (200 mg total) by mouth 3 (three) times daily. 07/20/17 07/20/18  Loleta Rose, MD  potassium chloride SA (KLOR-CON M20) 20 MEQ tablet Take 1 tablet (20 mEq total) by mouth daily. 07/20/17   Loleta Rose, MD  traMADol (ULTRAM) 50 MG tablet Take 1 tablet (50 mg total) by mouth every 12 (twelve) hours as needed. 01/06/19   Joni Reining, PA-C  FLUoxetine (PROZAC) 40 MG capsule Take 40 mg by mouth daily.  10/06/18  [provider]    Allergies Banana, Coconut fatty acids, Penicillins, and Sulfa antibiotics  Family History  Problem Relation Age of Onset  . Hypertension Mother   . Diabetes Mother   . Hypertension Father   . Diabetes Father     Social History Social History   Tobacco Use  . Smoking status: Former Smoker    Packs/day: 0.50    Types: Cigarettes  . Smokeless tobacco: Never Used  Substance Use Topics  . Alcohol use: No  . Drug use:  No    Review of Systems Constitutional: No fever/chills Eyes: No visual changes. ENT: No sore throat. Cardiovascular: Denies chest pain. Respiratory: Denies shortness of breath. Gastrointestinal: No abdominal pain.  No nausea, no vomiting.  No diarrhea.  No constipation. Genitourinary: Negative for dysuria. Musculoskeletal: Negative for back pain. Skin: Negative for rash. Neurological: Positive for headache, seizures  ____________________________________________   PHYSICAL EXAM:  VITAL SIGNS: ED Triage Vitals  Enc Vitals Group     BP      Pulse      Resp      Temp      Temp src      SpO2      Weight      Height      Head Circumference      Peak  Flow      Pain Score      Pain Loc      Pain Edu?      Excl. in [place]?     Constitutional: Alert and oriented.  Mild distress Eyes: Conjunctivae are normal. PERRL. EOMI. Head: Atraumatic. Nose: No congestion/rhinnorhea. Mouth/Throat: Mucous membranes are moist.  Oropharynx non-erythematous. Neck: No stridor.   Cardiovascular: Normal rate, regular rhythm. Grossly normal heart sounds.  Good peripheral circulation. Respiratory: Normal respiratory effort.  No retractions. Lungs CTAB. Gastrointestinal: Soft and nontender. No distention. No abdominal bruits. No CVA tenderness. Musculoskeletal: No lower extremity tenderness nor edema.  No joint effusions. Neurologic:  Normal speech and language. No gross focal neurologic deficits are appreciated. No gait instability. Skin:  Skin is warm, dry and intact. No rash noted. Psychiatric: Mood and affect are normal. Speech and behavior are normal.  ____________________________________________   LABS (all labs ordered are listed, but only abnormal results are displayed)  Labs Reviewed  CBC WITH DIFFERENTIAL/PLATELET - Abnormal; Notable for the following components:      Result Value   WBC 13.8 (*)    Neutro Abs 9.2 (*)    All other components within normal limits  COMPREHENSIVE METABOLIC PANEL - Abnormal; Notable for the following components:   Glucose, Bld 101 (*)    All other components within normal limits  URINALYSIS, COMPLETE (UACMP) WITH MICROSCOPIC - Abnormal; Notable for the following components:   Color, Urine STRAW (*)    APPearance CLEAR (*)    Hgb urine dipstick SMALL (*)    All other components within normal limits  GLUCOSE, CAPILLARY  PHENYTOIN LEVEL, FREE AND TOTAL  CBG MONITORING, ED   ___________________________________________  RADIOLOGY CT head was unremarkable ____________________________________________   INITIAL IMPRESSION / ASSESSMENT AND PLAN / ED COURSE  As part of my medical decision making, I reviewed the  following data within the [place] notes reviewed and incorporated, Notes from prior ED visits and [place] Controlled Substance Database  Patient presented for seizures and headache with dizziness.  We will assess with labs, give anxiolytics, will consider antihypertensive medicine ____________________________________________   FINAL CLINICAL IMPRESSION(S) / ED DIAGNOSES Headache, hypertension, seizure  Patient presented to the ER for seizure which is likely medication noncompliance related.  She has not been able to afford her antiepileptic medications.  She has also been out of her antihypertensives.  We have restarted all of these medications.  She is currently feeling better.  We will refer for close outpatient follow-up.    Note:  This document was prepared using Dragon voice recognition software and may include unintentional dictation errors.    Earleen Newport, MD 06/29/19 [phone]

## 2019-06-29 NOTE — ED Triage Notes (Addendum)
Patient arrived with EMS from home. Patient stated she was at work and feeling bad, she also stated she has a hx of hypertension and was feeling dizzy so she went home, while at home she felt she had three seizures and son called EMS. Patient takes Dilantin for seizures but has not taken in about a month

## 2019-07-02 LAB — PHENYTOIN LEVEL, FREE AND TOTAL
Phenytoin, Free: NOT DETECTED ug/mL (ref 1.0–2.0)
Phenytoin, Total: <0.8 ug/mL — ABNORMAL LOW (ref 10.0–20.0)

## 2019-07-28 ENCOUNTER — Emergency Department
Admission: EM | Admit: 2019-07-28 | Discharge: 2019-07-28 | Discharge status: Against Medical Advice | Payer: Self-pay | Attending: Emergency Medicine | Admitting: Emergency Medicine

## 2019-07-28 ENCOUNTER — Other Ambulatory Visit: Payer: Self-pay

## 2019-07-28 DIAGNOSIS — R55 Syncope and collapse: Secondary | ICD-10-CM | POA: Insufficient documentation

## 2019-07-28 DIAGNOSIS — Z87891 Personal history of nicotine dependence: Secondary | ICD-10-CM | POA: Insufficient documentation

## 2019-07-28 DIAGNOSIS — R4020 Unspecified coma: Secondary | ICD-10-CM

## 2019-07-28 DIAGNOSIS — Z79899 Other long term (current) drug therapy: Secondary | ICD-10-CM | POA: Insufficient documentation

## 2019-07-28 DIAGNOSIS — I10 Essential (primary) hypertension: Secondary | ICD-10-CM | POA: Insufficient documentation

## 2019-07-28 LAB — CBC
HCT: 42.4 % (ref 36.0–46.0)
Hemoglobin: 14.1 g/dL (ref 12.0–15.0)
MCH: 30.1 pg (ref 26.0–34.0)
MCHC: 33.3 g/dL (ref 30.0–36.0)
MCV: 90.4 fL (ref 80.0–100.0)
Platelets: 199 10*3/uL (ref 150–400)
RBC: 4.69 MIL/uL (ref 3.87–5.11)
RDW: 14.6 % (ref 11.5–15.5)
WBC: 10.3 10*3/uL (ref 4.0–10.5)
nRBC: 0 % (ref 0.0–0.2)

## 2019-07-28 LAB — GLUCOSE, CAPILLARY: Glucose-Capillary: 105 mg/dL — ABNORMAL HIGH (ref 70–99)

## 2019-07-28 NOTE — ED Triage Notes (Signed)
Pt arrives via EMS from her car after having a seizure when she got out the car- pt has epilepsy and has not taken any of her medications this morning- per EMS pt has seizures in stressful situations and had one when her son was pulled over today

## 2019-07-28 NOTE — ED Notes (Signed)
Pt given phone to speak with son. 

## 2019-07-28 NOTE — ED Provider Notes (Signed)
[facility] Emergency Department Provider Note  Time seen: 2:40 PM  I have reviewed the triage vital signs and the nursing notes.   HISTORY  Chief Complaint Seizures   HPI Kristy Kirby is a 44 y.o. female with a past medical history of hypertension, anxiety, seizures, presents to the emergency department for a possible seizure of her syncopal episode.  According to the patient she has been under a tremendous amount of stress, recently buried her son per patient.  Patient states they got pulled over today by police and were outside of the car when she began feeling very overheated and either passed out or had a seizure.   Patient denies any chest pain or shortness of breath at any point.  Patient is asking to leave the emergency department.  Did allow brief examination.  Past Medical History:  Diagnosis Date  . Anxiety   . Depression   . HTN (hypertension)   . Seizures ([place])     There are no problems to display for this patient.   Past Surgical History:  Procedure Laterality Date  . TUBAL LIGATION      Prior to Admission medications   Medication Sig Start Date End Date Taking? Authorizing Provider  albuterol (VENTOLIN HFA) 108 (90 Base) MCG/ACT inhaler Inhale 2 puffs into the lungs every 6 (six) hours as needed for wheezing or shortness of breath. 06/05/18   Lannie Fields, PA-C  cyclobenzaprine (FLEXERIL) 10 MG tablet Take 1 tablet (10 mg total) by mouth 3 (three) times daily as needed. 01/06/19   Sable Feil, PA-C  hydrochlorothiazide (HYDRODIURIL) 25 MG tablet Take 1 tablet (25 mg total) by mouth daily. 06/29/19   Earleen Newport, MD  levETIRAcetam (KEPPRA) 500 MG tablet Take 1 tablet (500 mg total) by mouth 2 (two) times daily. 07/20/17 08/19/17  Hinda Kehr, MD  lisinopril (PRINIVIL,ZESTRIL) 20 MG tablet Take 1 tablet (20 mg total) by mouth daily. 05/15/18 05/15/19  Laban Emperor, PA-C  losartan (COZAAR) 25 MG tablet Take 1 tablet (25 mg total)  by mouth daily. 06/29/19 06/28/20  Earleen Newport, MD  meloxicam (MOBIC) 7.5 MG tablet Take 1 tablet (7.5 mg total) by mouth daily. 06/29/19 06/28/20  Earleen Newport, MD  methocarbamol (ROBAXIN) 750 MG tablet Take 1 tablet (750 mg total) by mouth 2 (two) times daily as needed for muscle spasms. 10/06/18   Karen Kitchens, NP  metoprolol tartrate (LOPRESSOR) 25 MG tablet Take 1 tablet (25 mg total) by mouth 2 (two) times daily. 06/29/19   Earleen Newport, MD  naproxen (NAPROSYN) 500 MG tablet Take 1 tablet (500 mg total) by mouth 2 (two) times daily. 10/06/18   Karen Kitchens, NP  phenytoin (DILANTIN) 100 MG ER capsule Take 3 capsules (300 mg total) by mouth 2 (two) times daily. 06/29/19 10/27/19  Earleen Newport, MD  potassium chloride SA (KLOR-CON M20) 20 MEQ tablet Take 1 tablet (20 mEq total) by mouth daily. 07/20/17   Hinda Kehr, MD  traMADol (ULTRAM) 50 MG tablet Take 1 tablet (50 mg total) by mouth every 12 (twelve) hours as needed. 01/06/19   Sable Feil, PA-C  FLUoxetine (PROZAC) 40 MG capsule Take 40 mg by mouth daily.  10/06/18  [provider]    Allergies  Allergen Reactions  . Banana   . Coconut Fatty Acids   . Penicillins   . Sulfa Antibiotics     Family History  Problem Relation Age of Onset  .  Hypertension Mother   . Diabetes Mother   . Hypertension Father   . Diabetes Father     Social History Social History   Tobacco Use  . Smoking status: Former Smoker    Packs/day: 0.50    Types: Cigarettes  . Smokeless tobacco: Never Used  Vaping Use  . Vaping Use: Every day  Substance Use Topics  . Alcohol use: No  . Drug use: No    Review of Systems Constitutional: Negative for fever. Cardiovascular: Negative for chest pain. Respiratory: Negative for shortness of breath. Gastrointestinal: Negative for abdominal pain Musculoskeletal: Negative for musculoskeletal complaints Neurological: Negative for headache All other ROS  negative  ____________________________________________   PHYSICAL EXAM:  VITAL SIGNS: ED Triage Vitals  Enc Vitals Group     BP 07/28/19 1350 (!) 167/123     Pulse Rate 07/28/19 1350 97     Resp 07/28/19 1350 20     Temp 07/28/19 1357 99 F (37.2 C)     Temp Source 07/28/19 1357 Oral     SpO2 07/28/19 1350 95 %     Weight 07/28/19 1351 240 lb (108.9 kg)     Height 07/28/19 1351 5\' 3"  (1.6 m)     Head Circumference --      Peak Flow --      Pain Score 07/28/19 1351 3     Pain Loc --      Pain Edu? --      Excl. in [place]? --    Constitutional: Alert and oriented. Well appearing and in no distress. Eyes: Normal exam ENT      Head: Normocephalic and atraumatic.      Mouth/Throat: Mucous membranes are moist. Cardiovascular: Normal rate, regular rhythm. Respiratory: Normal respiratory effort without tachypnea nor retractions. Breath sounds are clear  Gastrointestinal: Soft and nontender. No distention.   Musculoskeletal: Nontender with normal range of motion in all extremities. Neurologic:  Normal speech and language. No gross focal neurologic deficits Skin:  Skin is warm, dry and intact.  Psychiatric: Mood and affect are normal.   ____________________________________________   INITIAL IMPRESSION / ASSESSMENT AND PLAN / ED COURSE  Pertinent labs & imaging results that were available during my care of the patient were reviewed by me and considered in my medical decision making (see chart for details).   Patient presents emergency department after a seizure of her syncopal event while getting pulled over by police per patient.  Patient states no chest pain or shortness of breath at any point.  States she has not missed any doses of her Dilantin and recently refilled her prescription.  Patient states she did seem somewhat confused upon awakening so she is not sure if this was a seizure or syncopal episode.  Patient however states she wants to leave and does not want any further  work-up.  States her daughter is outside and she needs to leave with her.  Patient appears to have capacity to make her own medical decisions does not want any further work-up performed and is requesting to leave AGAINST MEDICAL ADVICE before her work-up could be completed.  Kristy Kirby was evaluated in Emergency Department on 07/28/2019 for the symptoms described in the history of present illness. She was evaluated in the context of the global COVID-19 pandemic, which necessitated consideration that the patient might be at risk for infection with the SARS-CoV-2 virus that causes COVID-19. Institutional protocols and algorithms that pertain to the evaluation of patients at risk for COVID-19 are  in a state of rapid change based on information released by regulatory bodies including the CDC and federal and state organizations. These policies and algorithms were followed during the patient's care in the ED.  ____________________________________________   FINAL CLINICAL IMPRESSION(S) / ED DIAGNOSES  Loss of consciousness   Minna Antis, MD 07/28/19 1444

## 2019-08-11 ENCOUNTER — Emergency Department: Payer: BC Managed Care – PPO

## 2019-08-11 ENCOUNTER — Encounter: Payer: Self-pay | Admitting: Emergency Medicine

## 2019-08-11 ENCOUNTER — Emergency Department
Admission: EM | Admit: 2019-08-11 | Discharge: 2019-08-11 | Discharge status: Against Medical Advice | Payer: BC Managed Care – PPO | Attending: Emergency Medicine | Admitting: Emergency Medicine

## 2019-08-11 ENCOUNTER — Other Ambulatory Visit: Payer: Self-pay

## 2019-08-11 DIAGNOSIS — Z87891 Personal history of nicotine dependence: Secondary | ICD-10-CM | POA: Insufficient documentation

## 2019-08-11 DIAGNOSIS — I10 Essential (primary) hypertension: Secondary | ICD-10-CM | POA: Diagnosis not present

## 2019-08-11 DIAGNOSIS — Z79899 Other long term (current) drug therapy: Secondary | ICD-10-CM | POA: Insufficient documentation

## 2019-08-11 DIAGNOSIS — R569 Unspecified convulsions: Secondary | ICD-10-CM | POA: Insufficient documentation

## 2019-08-11 LAB — BASIC METABOLIC PANEL
Anion gap: 9 (ref 5–15)
BUN: 14 mg/dL (ref 6–20)
CO2: 26 mmol/L (ref 22–32)
Calcium: 9 mg/dL (ref 8.9–10.3)
Chloride: 105 mmol/L (ref 98–111)
Creatinine, Ser: 1.15 mg/dL — ABNORMAL HIGH (ref 0.44–1.00)
GFR calc Af Amer: >60 mL/min (ref >60)
GFR calc non Af Amer: 58 mL/min — ABNORMAL LOW (ref >60)
Glucose, Bld: 110 mg/dL — ABNORMAL HIGH (ref 70–99)
Potassium: 3.2 mmol/L — ABNORMAL LOW (ref 3.5–5.1)
Sodium: 140 mmol/L (ref 135–145)

## 2019-08-11 LAB — CBC WITH DIFFERENTIAL/PLATELET
Abs Immature Granulocytes: 0.03 10*3/uL (ref 0.00–0.07)
Basophils Absolute: 0 10*3/uL (ref 0.0–0.1)
Basophils Relative: 0 %
Eosinophils Absolute: 0 10*3/uL (ref 0.0–0.5)
Eosinophils Relative: 0 %
HCT: 39.7 % (ref 36.0–46.0)
Hemoglobin: 13.4 g/dL (ref 12.0–15.0)
Immature Granulocytes: 0 %
Lymphocytes Relative: 22 %
Lymphs Abs: 2.3 10*3/uL (ref 0.7–4.0)
MCH: 30 pg (ref 26.0–34.0)
MCHC: 33.8 g/dL (ref 30.0–36.0)
MCV: 88.8 fL (ref 80.0–100.0)
Monocytes Absolute: 0.6 10*3/uL (ref 0.1–1.0)
Monocytes Relative: 6 %
Neutro Abs: 7.6 10*3/uL (ref 1.7–7.7)
Neutrophils Relative %: 72 %
Platelets: 222 10*3/uL (ref 150–400)
RBC: 4.47 MIL/uL (ref 3.87–5.11)
RDW: 14.8 % (ref 11.5–15.5)
WBC: 10.7 10*3/uL — ABNORMAL HIGH (ref 4.0–10.5)
nRBC: 0 % (ref 0.0–0.2)

## 2019-08-11 LAB — PHENYTOIN LEVEL, TOTAL: Phenytoin Lvl: <2.5 ug/mL — ABNORMAL LOW (ref 10.0–20.0)

## 2019-08-11 MED ORDER — PHENYTOIN SODIUM EXTENDED 100 MG PO CAPS
300.0000 mg | ORAL_CAPSULE | Freq: Once | ORAL | Status: AC
  Administered 2019-08-11: 300 mg via ORAL
  Filled 2019-08-11: qty 3

## 2019-08-11 NOTE — ED Notes (Signed)
Pt's visitor out to nurse's station, c/o the patient wants to go home. I went to speak with patient, pt states she feels better and wants to go home, states she gets this way when she gets overheated and drank some alcohol.  Dr. Larinda Buttery notified and will go in a talk to patient. Advised patient he would be coming in to speak with her.

## 2019-08-11 NOTE — ED Triage Notes (Signed)
Pt to ED via [facility] from home for multiple seizures. Per EMS, pt was at an outdoor party and had 3-4 seizures per family. EMS reports that upon their arrival pt had a full body seizure and was given 2.5 mg of IV Versed. Pt arrived to ED alert and looking around. Pt still slight confused.  EMS reports that pt has hx/o seizures and family reports that there were recent changes in patients seizure medications. Pt hypertensive with EMS. Per EMS pt states that pt has hx/o HTN and took her medication this morning.  Pt is in NAD at this time.

## 2019-08-11 NOTE — ED Notes (Signed)
Seizure pads in place, suction and O2 set up at bedside

## 2019-08-11 NOTE — ED Provider Notes (Signed)
[facility] Emergency Department Provider Note   ____________________________________________   First MD Initiated Contact with Patient 08/11/19 1807     (approximate)  I have reviewed the triage vital signs and the nursing notes.   HISTORY  Chief Complaint Seizures    HPI Kristy Kirby is a 44 y.o. female with possible history of hypertension and seizures who presents to the ED following seizure.  History is limited due to patient's postictal state on arrival.  Per EMS patient had multiple witnessed seizures by family at an outdoor party earlier this evening.  When EMS arrived, patient had a generalized tonic-clonic seizure lasting about 30 seconds.  She was postictal following this episode and received 2.5 mg of IV Versed.  She has been gradually waking up since then, on arrival is awake and alert but confused.  She denies any complaints and states she has been taking her Dilantin as prescribed, including this morning.        Past Medical History:  Diagnosis Date  . Anxiety   . Depression   . HTN (hypertension)   . Seizures (HCC)     There are no problems to display for this patient.   Past Surgical History:  Procedure Laterality Date  . TUBAL LIGATION      Prior to Admission medications   Medication Sig Start Date End Date Taking? Authorizing Provider  albuterol (VENTOLIN HFA) 108 (90 Base) MCG/ACT inhaler Inhale 2 puffs into the lungs every 6 (six) hours as needed for wheezing or shortness of breath. 06/05/18   Orvil Feil, PA-C  cyclobenzaprine (FLEXERIL) 10 MG tablet Take 1 tablet (10 mg total) by mouth 3 (three) times daily as needed. 01/06/19   Joni Reining, PA-C  hydrochlorothiazide (HYDRODIURIL) 25 MG tablet Take 1 tablet (25 mg total) by mouth daily. 06/29/19   Emily Filbert, MD  levETIRAcetam (KEPPRA) 500 MG tablet Take 1 tablet (500 mg total) by mouth 2 (two) times daily. 07/20/17 08/19/17  Loleta Rose, MD  lisinopril  (PRINIVIL,ZESTRIL) 20 MG tablet Take 1 tablet (20 mg total) by mouth daily. 05/15/18 05/15/19  Enid Derry, PA-C  losartan (COZAAR) 25 MG tablet Take 1 tablet (25 mg total) by mouth daily. 06/29/19 06/28/20  Emily Filbert, MD  meloxicam (MOBIC) 7.5 MG tablet Take 1 tablet (7.5 mg total) by mouth daily. 06/29/19 06/28/20  Emily Filbert, MD  methocarbamol (ROBAXIN) 750 MG tablet Take 1 tablet (750 mg total) by mouth 2 (two) times daily as needed for muscle spasms. 10/06/18   Verlee Monte, NP  metoprolol tartrate (LOPRESSOR) 25 MG tablet Take 1 tablet (25 mg total) by mouth 2 (two) times daily. 06/29/19   Emily Filbert, MD  naproxen (NAPROSYN) 500 MG tablet Take 1 tablet (500 mg total) by mouth 2 (two) times daily. 10/06/18   Verlee Monte, NP  phenytoin (DILANTIN) 100 MG ER capsule Take 3 capsules (300 mg total) by mouth 2 (two) times daily. 06/29/19 10/27/19  Emily Filbert, MD  potassium chloride SA (KLOR-CON M20) 20 MEQ tablet Take 1 tablet (20 mEq total) by mouth daily. 07/20/17   Loleta Rose, MD  traMADol (ULTRAM) 50 MG tablet Take 1 tablet (50 mg total) by mouth every 12 (twelve) hours as needed. 01/06/19   Joni Reining, PA-C  FLUoxetine (PROZAC) 40 MG capsule Take 40 mg by mouth daily.  10/06/18  [provider]    Allergies Banana, Coconut fatty acids, Penicillins, and Sulfa antibiotics  Family History  Problem Relation Age of Onset  . Hypertension Mother   . Diabetes Mother   . Hypertension Father   . Diabetes Father     Social History Social History   Tobacco Use  . Smoking status: Former Smoker    Packs/day: 0.50    Types: Cigarettes  . Smokeless tobacco: Never Used  Vaping Use  . Vaping Use: Every day  Substance Use Topics  . Alcohol use: No  . Drug use: No    Review of Systems  Constitutional: No fever/chills Eyes: No visual changes. ENT: No sore throat. Cardiovascular: Denies chest pain. Respiratory: Denies shortness of  breath. Gastrointestinal: No abdominal pain.  No nausea, no vomiting.  No diarrhea.  No constipation. Genitourinary: Negative for dysuria. Musculoskeletal: Negative for back pain. Skin: Negative for rash. Neurological: Negative for headaches, focal weakness or numbness.  Positive for seizure.  ____________________________________________   PHYSICAL EXAM:  VITAL SIGNS: ED Triage Vitals [08/11/19 1803]  Enc Vitals Group     BP      Pulse      Resp      Temp      Temp src      SpO2 98 %     Weight      Height      Head Circumference      Peak Flow      Pain Score      Pain Loc      Pain Edu?      Excl. in [place]?     Constitutional: Awake and alert, postictal. Eyes: Conjunctivae are normal. Head: Atraumatic. Nose: No congestion/rhinnorhea. Mouth/Throat: Mucous membranes are moist. Neck: Normal ROM Cardiovascular: Tachycardic, regular rhythm. Grossly normal heart sounds. Respiratory: Normal respiratory effort.  No retractions. Lungs CTAB. Gastrointestinal: Soft and nontender. No distention. Genitourinary: deferred Musculoskeletal: No lower extremity tenderness nor edema. Neurologic:  Normal speech and language. No gross focal neurologic deficits are appreciated. Skin:  Skin is warm, dry and intact. No rash noted. Psychiatric: Mood and affect are normal. Speech and behavior are normal.  ____________________________________________   LABS (all labs ordered are listed, but only abnormal results are displayed)  Labs Reviewed  CBC WITH DIFFERENTIAL/PLATELET - Abnormal; Notable for the following components:      Result Value   WBC 10.7 (*)    All other components within normal limits  BASIC METABOLIC PANEL - Abnormal; Notable for the following components:   Potassium 3.2 (*)    Glucose, Bld 110 (*)    Creatinine, Ser 1.15 (*)    GFR calc non Af Amer 58 (*)    All other components within normal limits  PHENYTOIN LEVEL, TOTAL - Abnormal; Notable for the following  components:   Phenytoin Lvl <2.5 (*)    All other components within normal limits   ____________________________________________  EKG  ED ECG REPORT I, Blake Divine, the attending physician, personally viewed and interpreted this ECG.   Date: 08/11/2019  EKG Time: 18:07  Rate: 111  Rhythm: sinus tachycardia  Axis: Normal  Intervals:none  ST&T Change: None   PROCEDURES  Procedure(s) performed (including Critical Care):  Procedures   ____________________________________________   INITIAL IMPRESSION / ASSESSMENT AND PLAN / ED COURSE       44 year old female with past medical history of hypertension and seizures presents to the ED following multiple witnessed seizures by family including an additional episode after EMS arrived.  She received 1 dose of IV Versed and has not had any further seizure  activity since then, arrives awake and alert but postictal with some mild confusion.  She does not appear to have any focal neurologic deficits, given multiple seizures today we will check CT head.  EKG shows no evidence of arrhythmia or ischemia, labs are pending.  We will give patient her usual evening dose of Dilantin, Dilantin level is currently pending.  Patient remains awake and alert with no further seizure activity here in the ED.  Dilantin level is undetectable, although patient continues to assure me that she takes her medication as prescribed.  She was given her evening dose of medication here in the ED but refused head CT.  She is now requesting to leave the hospital, and I explained to her that this would mean she would be leaving AGAINST MEDICAL ADVICE.  Patient expresses understanding of the risks and still wishes to sign out AGAINST MEDICAL ADVICE, states she will follow up with her neurologist as scheduled.      ____________________________________________   FINAL CLINICAL IMPRESSION(S) / ED DIAGNOSES  Final diagnoses:  Seizures [facility])     ED Discharge Orders     None       Note:  This document was prepared using Dragon voice recognition software and may include unintentional dictation errors.   Chesley Noon, MD 08/11/19 [phone]

## 2019-08-11 NOTE — ED Notes (Addendum)
EDP in room explaining to patient AMA process. Pt verbalized understanding of AMA process and that she has plans to follow up with neuro on Thurs.

## 2019-08-17 ENCOUNTER — Encounter: Payer: Self-pay | Admitting: Family Medicine

## 2019-08-17 ENCOUNTER — Ambulatory Visit (INDEPENDENT_AMBULATORY_CARE_PROVIDER_SITE_OTHER): Payer: BC Managed Care – PPO | Admitting: Family Medicine

## 2019-08-17 ENCOUNTER — Other Ambulatory Visit: Payer: Self-pay

## 2019-08-17 VITALS — BP 140/100 | HR 80 | Ht 62.0 in | Wt 254.0 lb

## 2019-08-17 DIAGNOSIS — F418 Other specified anxiety disorders: Secondary | ICD-10-CM

## 2019-08-17 DIAGNOSIS — G40409 Other generalized epilepsy and epileptic syndromes, not intractable, without status epilepticus: Secondary | ICD-10-CM | POA: Diagnosis not present

## 2019-08-17 DIAGNOSIS — I10 Essential (primary) hypertension: Secondary | ICD-10-CM | POA: Diagnosis not present

## 2019-08-17 DIAGNOSIS — Z124 Encounter for screening for malignant neoplasm of cervix: Secondary | ICD-10-CM

## 2019-08-17 DIAGNOSIS — E876 Hypokalemia: Secondary | ICD-10-CM

## 2019-08-17 DIAGNOSIS — Z7689 Persons encountering health services in other specified circumstances: Secondary | ICD-10-CM | POA: Diagnosis not present

## 2019-08-17 MED ORDER — LEVETIRACETAM 500 MG PO TABS: 500.0000 mg | ORAL_TABLET | Freq: Two times a day (BID) | ORAL | 1 refills | Status: DC

## 2019-08-17 MED ORDER — FLUOXETINE HCL 10 MG PO CAPS: 10.0000 mg | ORAL_CAPSULE | Freq: Every day | ORAL | 1 refills | Status: DC

## 2019-08-17 MED ORDER — POTASSIUM CHLORIDE CRYS ER 20 MEQ PO TBCR: 20.0000 meq | EXTENDED_RELEASE_TABLET | Freq: Every day | ORAL | 1 refills | Status: AC

## 2019-08-17 NOTE — Progress Notes (Addendum)
Date:  08/17/2019   Name:  Kristy Kirby   DOB:  08-07-1975   MRN:  [id number]   Chief Complaint: Establish Care (11 and 16 on PHQ and GAD), Depression (wants psychiatrist/ son died), Gynecologic Exam ([place]), and Seizures (wants ref to neuro)  Patient is a 44 year old female who presents for a comprehensive physical exam. The patient reports the following problems: seizure disorder. Health maintenance has been reviewed pap.  Depression      (Amenorrhea since december)  This is a chronic problem.  The current episode started more than 1 year ago.   The problem occurs intermittently.  The problem has been gradually worsening since onset.  Associated symptoms include no decreased concentration, no fatigue, no helplessness, no hopelessness, does not have insomnia, not irritable, no restlessness, no decreased interest, no appetite change, no body aches, no myalgias, no headaches, no indigestion, not sad and no suicidal ideas.     The symptoms are aggravated by nothing.  Past treatments include SSRIs - Selective serotonin reuptake inhibitors (fluoxetine).  Compliance with treatment is variable.  Previous treatment provided mild relief.  Risk factors include major life event.    Pertinent negatives include no anxiety. Gynecologic Exam The patient's pertinent negatives include no genital itching, genital lesions, genital odor, genital rash, missed menses, pelvic pain, vaginal bleeding or vaginal discharge. Primary symptoms comment: amenorrhea since december. This is a new (for 7 month) problem. The current episode started more than 1 year ago. The problem occurs daily. The problem has been gradually improving. The pain is moderate. Associated symptoms include frequency. Pertinent negatives include no abdominal pain, anorexia, back pain, chills, constipation, diarrhea, discolored urine, dysuria, fever, flank pain, headaches, hematuria, joint pain, joint swelling, nausea, painful intercourse, rash, sore  throat, urgency or vomiting.  Seizures  This is a chronic problem. The problem has been gradually worsening. Associated symptoms include confusion. Pertinent negatives include no sleepiness, no headaches, no speech difficulty, no visual disturbance, no neck stiffness, no sore throat, no chest pain, no cough, no nausea, no vomiting and no diarrhea. Characteristics include rhythmic jerking and loss of consciousness. Characteristics do not include eye blinking, eye deviation, bowel incontinence, bladder incontinence, bit tongue, apnea or cyanosis. The episode was witnessed.  Hypertension This is a chronic problem. The problem has been waxing and waning since onset. The problem is uncontrolled. Pertinent negatives include no anxiety, blurred vision, chest pain, headaches, malaise/fatigue, neck pain, orthopnea, palpitations, PND, shortness of breath or sweats. There are no associated agents to hypertension. Past treatments include angiotensin blockers, diuretics and beta blockers. The current treatment provides moderate improvement. There are no compliance problems.  There is no history of angina, kidney disease, CAD/MI, CVA, heart failure, left ventricular hypertrophy, PVD or retinopathy. There is no history of chronic renal disease, a hypertension causing med or renovascular disease.    Lab Results  Component Value Date   CREATININE 1.15 (H) 08/11/2019   BUN 14 08/11/2019   NA 140 08/11/2019   K 3.2 (L) 08/11/2019   CL 105 08/11/2019   CO2 26 08/11/2019   No results found for: CHOL, HDL, LDLCALC, LDLDIRECT, TRIG, CHOLHDL Lab Results  Component Value Date   TSH 0.40 (L) 09/27/2012   No results found for: HGBA1C Lab Results  Component Value Date   WBC 10.7 (H) 08/11/2019   HGB 13.4 08/11/2019   HCT 39.7 08/11/2019   MCV 88.8 08/11/2019   PLT 222 08/11/2019   Lab Results  Component Value  Date   ALT 20 06/29/2019   AST 22 06/29/2019   ALKPHOS 67 06/29/2019   BILITOT 0.9 06/29/2019      Review of Systems  Constitutional: Negative.  Negative for appetite change, chills, fatigue, fever, malaise/fatigue and unexpected weight change.  HENT: Negative for congestion, ear discharge, ear pain, rhinorrhea, sinus pressure, sneezing and sore throat.   Eyes: Negative for blurred vision, photophobia, pain, discharge, redness, itching and visual disturbance.  Respiratory: Negative for apnea, cough, shortness of breath, wheezing and stridor.   Cardiovascular: Negative for chest pain, palpitations, orthopnea, PND and cyanosis.  Gastrointestinal: Negative for abdominal pain, anorexia, blood in stool, bowel incontinence, constipation, diarrhea, nausea and vomiting.  Endocrine: Negative for cold intolerance, heat intolerance, polydipsia, polyphagia and polyuria.  Genitourinary: Positive for frequency. Negative for bladder incontinence, dysuria, flank pain, hematuria, menstrual problem, missed menses, pelvic pain, urgency, vaginal bleeding and vaginal discharge.  Musculoskeletal: Negative for arthralgias, back pain, joint pain, myalgias and neck pain.  Skin: Negative for rash.  Allergic/Immunologic: Negative for environmental allergies and food allergies.  Neurological: Positive for seizures and loss of consciousness. Negative for dizziness, speech difficulty, weakness, light-headedness, numbness and headaches.  Hematological: Negative for adenopathy. Does not bruise/bleed easily.  Psychiatric/Behavioral: Positive for confusion and depression. Negative for decreased concentration, dysphoric mood and suicidal ideas. The patient is not nervous/anxious and does not have insomnia.     There are no problems to display for this patient.   Allergies  Allergen Reactions  . Banana   . Coconut Fatty Acids   . Penicillins   . Sulfa Antibiotics     Past Surgical History:  Procedure Laterality Date  . TUBAL LIGATION      Social History   Tobacco Use  . Smoking status: Former Smoker     Packs/day: 0.50    Types: Cigarettes  . Smokeless tobacco: Never Used  Vaping Use  . Vaping Use: Every day  Substance Use Topics  . Alcohol use: No  . Drug use: No     Medication list has been reviewed and updated.  Current Meds  Medication Sig  . albuterol (VENTOLIN HFA) 108 (90 Base) MCG/ACT inhaler Inhale 2 puffs into the lungs every 6 (six) hours as needed for wheezing or shortness of breath.  . cyclobenzaprine (FLEXERIL) 10 MG tablet Take 1 tablet (10 mg total) by mouth 3 (three) times daily as needed.  . hydrochlorothiazide (HYDRODIURIL) 25 MG tablet Take 1 tablet (25 mg total) by mouth daily.  Marland Kitchen. levETIRAcetam (KEPPRA) 500 MG tablet Take 1 tablet (500 mg total) by mouth 2 (two) times daily.  Marland Kitchen. losartan (COZAAR) 25 MG tablet Take 1 tablet (25 mg total) by mouth daily.  . meloxicam (MOBIC) 7.5 MG tablet Take 1 tablet (7.5 mg total) by mouth daily.  . methocarbamol (ROBAXIN) 750 MG tablet Take 1 tablet (750 mg total) by mouth 2 (two) times daily as needed for muscle spasms.  . metoprolol tartrate (LOPRESSOR) 25 MG tablet Take 1 tablet (25 mg total) by mouth 2 (two) times daily.  . naproxen (NAPROSYN) 500 MG tablet Take 1 tablet (500 mg total) by mouth 2 (two) times daily.  . phenytoin (DILANTIN) 100 MG ER capsule Take 3 capsules (300 mg total) by mouth 2 (two) times daily.  . potassium chloride SA (KLOR-CON M20) 20 MEQ tablet Take 1 tablet (20 mEq total) by mouth daily.  . traMADol (ULTRAM) 50 MG tablet Take 1 tablet (50 mg total) by mouth every 12 (twelve) hours as needed. (  Patient taking differently: Take 50 mg by mouth every 12 (twelve) hours as needed. ER)    PHQ 2/9 Scores 08/17/2019  PHQ - 2 Score 4  PHQ- 9 Score 11    GAD 7 : Generalized Anxiety Score 08/17/2019  Nervous, Anxious, on Edge 1  Control/stop worrying 3  Worry too much - different things 3  Trouble relaxing 3  Restless 3  Easily annoyed or irritable 2  Afraid - awful might happen 1  Total GAD 7 Score 16    Anxiety Difficulty Very difficult    BP Readings from Last 3 Encounters:  08/17/19 (!) 140/100  08/11/19 (!) 177/126  07/28/19 (!) 167/123    Physical Exam Vitals and nursing note reviewed.  Constitutional:      General: She is not irritable.She is not in acute distress.    Appearance: She is not diaphoretic.  HENT:     Head: Normocephalic and atraumatic.     Right Ear: Tympanic membrane, ear canal and external ear normal.     Left Ear: Tympanic membrane, ear canal and external ear normal.     Nose: Nose normal.  Eyes:     General:        Right eye: No discharge.        Left eye: No discharge.     Extraocular Movements: Extraocular movements intact.     Conjunctiva/sclera: Conjunctivae normal.     Pupils: Pupils are equal, round, and reactive to light.  Neck:     Thyroid: No thyromegaly.     Vascular: No JVD.  Cardiovascular:     Rate and Rhythm: Normal rate and regular rhythm.     Heart sounds: Normal heart sounds. No murmur heard.  No friction rub. No gallop.   Pulmonary:     Effort: Pulmonary effort is normal.     Breath sounds: Normal breath sounds.  Abdominal:     General: Bowel sounds are normal.     Palpations: Abdomen is soft. There is no mass.     Tenderness: There is no abdominal tenderness. There is no guarding.     Hernia: No hernia is present.  Musculoskeletal:        General: Normal range of motion.     Cervical back: Normal range of motion and neck supple.  Lymphadenopathy:     Cervical: No cervical adenopathy.  Skin:    General: Skin is warm and dry.  Neurological:     Mental Status: She is alert.     Deep Tendon Reflexes: Reflexes are normal and symmetric.     Wt Readings from Last 3 Encounters:  08/17/19 254 lb (115.2 kg)  08/11/19 238 lb 1.6 oz (108 kg)  07/28/19 240 lb (108.9 kg)    BP (!) 140/100   Pulse 80   Ht 5\' 2"  (1.575 m)   Wt 254 lb (115.2 kg)   BMI 46.46 kg/m   Assessment and Plan: 1. Establishing care with new doctor,  encounter for Patient establishing care with new physician.  Patient's previous encounters mostly with emergency room and urgent cares were reviewed.  Any recent labs and imaging were reviewed as well.  2. Seizure disorder, grand mal (HCC) Patient has had previous history of seizure disorder for which she was off her medications due to unemployment but she is now gainfully employed.  She needs to continue her Dilantin and I will refill her Keppra and start her back at the dosing of 500 mg twice a day neurology referral  was made. - Ambulatory referral to Neurology - levETIRAcetam (KEPPRA) 500 MG tablet; Take 1 tablet (500 mg total) by mouth 2 (two) times daily.  Dispense: 60 tablet; Refill: 1  3. Essential hypertension Chronic.  Controlled.  Stable.  Continue hydrochlorothiazide, losartan, and metoprolol as previously prescribed.  Will recheck in 4 weeks.  4. Anxiety associated with depression Chronic.  Uncontrolled.  Relatively stable given the circumstances.  Patient has lost a second child recently.  Patient will be resumed on her fluoxetine at 10 mg dosing to be rechecked either by myself or psychiatry to be continued. - Ambulatory referral to Psychiatry - FLUoxetine (PROZAC) 10 MG capsule; Take 1 capsule (10 mg total) by mouth daily.  Dispense: 30 capsule; Refill: 1  5. Hypokalemia Patient has had low potassiums in the past review of labs does not note any concern with renal insufficiency and potassium was noted to be 3.2 we will resume this at 20 mEq. - potassium chloride SA (KLOR-CON M20) 20 MEQ tablet; Take 1 tablet (20 mEq total) by mouth daily.  Dispense: 90 tablet; Refill: 1  6. Cervical cancer screening Patient has been referred to gynecology for Pap smear/HPV and cervical cancer screening. - Ambulatory referral to Psychiatry - Ambulatory referral to Gynecology  Patient has been given all 3 telephone numbers for referrals and patient is to call us next week if she has not heard  from the referral circumstances that were provided.

## 2019-08-21 ENCOUNTER — Telehealth: Payer: Self-pay

## 2019-08-21 ENCOUNTER — Telehealth: Payer: Self-pay | Admitting: Family Medicine

## 2019-08-21 ENCOUNTER — Ambulatory Visit: Payer: BC Managed Care – PPO | Admitting: Family Medicine

## 2019-08-21 ENCOUNTER — Ambulatory Visit: Payer: Self-pay

## 2019-08-21 ENCOUNTER — Emergency Department
Admission: EM | Admit: 2019-08-21 | Discharge: 2019-08-21 | Discharge status: Against Medical Advice | Payer: BC Managed Care – PPO

## 2019-08-21 NOTE — Telephone Encounter (Signed)
I received a call from [facility] stating that she called pt to give her her appt and pt started crying and saying she couldn't take it no more. She hung up and Lowella Bandy tried to call her back twice before calling here. I tried to called th pt and she did answer. Pt stated that "It all hit me at once, I don't want to feel and be here no more, I don't have a place to go, I got put out last night so I'm sleeping in my car." I asked pt if she was thinking of harming herself. She Stated," I don't feel I want to hurt myself." I asked her to call Chronic care management and they would be able to assist her with a place to live. Patient disconnected and I called the police for fear something may happen between both phone calls. [place] police were dispatched and called stating she had "left her house with her uncle." The police were able to tell me that "Her sister is pulling up now and she has her on the phone." Patient is to be told by police to stop Fluoxetine, per Dr Yetta Barre, due to the above statements.

## 2019-08-21 NOTE — Telephone Encounter (Signed)
Has been followed up on and documented in telephone calls

## 2019-08-21 NOTE — Telephone Encounter (Signed)
Called patient to give her an appointment for a referral. I noticed the patient was crying. I asked her what was wrong. She told me everything had come down on her all at once. She then stated she just didn't want to be here anymore. She stated she was trying to get an appointment with Behavioral Health.  I told her I would call and try to get help for her. I told her I would call her right back so stay by the phone. I hung up and called [facility] and sent Dr Yetta Barre nurse, Delice Bison, a secure message. I spoke with [facility] and they suggested the patient go to the ER. I tried calling the patient several times and didn't get an answer. I then spoke with Delice Bison and she stated she would call the patient and if she didn't answer, she would call 911. Tara sent me a message stating she talked to the patient and she told her the same thing and then hung up. 911 was called

## 2019-08-21 NOTE — Telephone Encounter (Signed)
Patient called stating that she was at work today and started to think about her brother and son.  She broke down in tears and had to leave work. She states that she was speaking with a nurse when her phone died. At that point the police were dispatched. She called back sitting in her car at the office stating that the police asked her to go to her Dr Isidore Moos.  She is with her adult son. Patient states that she is depressed. She has been out of work with this and tried to return to work today. She became overwhelmed with grief. She has recently lost a son June 2021. She denies suicidal and homicidal thoughts. She reports loss of apatite and a headache She states that she has call behavioral health and they informed her that they have no new patient appointments and will call her back when they do. Per protocol patient will have her son take her to ER for evaluation. I attempted to give her mor resources to call but she had noting to write the numbers.  Reason for Disposition . [1] Depression AND [2] unable to do any of normal activities (e.g., self care, school, work; in comparison to baseline).  Answer Assessment - Initial Assessment Questions 1. CONCERN: "What happened that made you call today?"     Im depressed 2. DEPRESSION SYMPTOM SCREENING: "How are you feeling overall?" (e.g., decreased energy, increased sleeping or difficulty sleeping, difficulty concentrating, feelings of sadness, guilt, hopelessness, or worthlessness)    Sad cant stop crying thinking about brother son 3. RISK OF HARM - SUICIDAL IDEATION:  "Do you ever have thoughts of hurting or killing yourself?"  (e.g., yes, no, no but preoccupation with thoughts about death)   - INTENT:  "Do you have thoughts of hurting or killing yourself right NOW?" (e.g., yes, no, N/A)   - PLAN: "Do you have a specific plan for how you would do this?" (e.g., gun, knife, overdose, no plan, N/A)     no 4. RISK OF HARM - HOMICIDAL IDEATION:  "Do you ever  have thoughts of hurting or killing someone else?"  (e.g., yes, no, no but preoccupation with thoughts about death)   - INTENT:  "Do you have thoughts of hurting or killing someone right NOW?" (e.g., yes, no, N/A)   - PLAN: "Do you have a specific plan for how you would do this?" (e.g., gun, knife, no plan, N/A)      no 5. FUNCTIONAL IMPAIRMENT: "How have things been going for you overall? Have you had more difficulty than usual doing your normal daily activities?"  (e.g., better, same, worse; self-care, school, work, interactions)    Not able to complete daily activities 6. SUPPORT: "Who is with you now?" "Who do you live with?" "Do you have family or friends who you can talk to?"      Son with you  7. THERAPIST: "Do you have a counselor or therapist? Name?"     No but did see thrapist with passing of sons 8. STRESSORS: "Has there been any new stress or recent changes in your life?"     June 1st loss a son 27. ALCOHOL USE OR SUBSTANCE USE (DRUG USE): "Do you drink alcohol or use any illegal drugs?"    no 10. OTHER: "Do you have any other physical symptoms right now?" (e.g., fever)       Loss of appatite and headache 11. PREGNANCY: "Is there any chance you are pregnant?" "When was your last  menstrual period?"       Np no periods  Protocols used: DEPRESSION-A-AH

## 2019-08-21 NOTE — Telephone Encounter (Signed)
Spoke to pt

## 2019-08-22 ENCOUNTER — Ambulatory Visit (INDEPENDENT_AMBULATORY_CARE_PROVIDER_SITE_OTHER): Payer: BC Managed Care – PPO | Admitting: Family Medicine

## 2019-08-22 ENCOUNTER — Encounter: Payer: Self-pay | Admitting: Family Medicine

## 2019-08-22 ENCOUNTER — Other Ambulatory Visit: Payer: Self-pay

## 2019-08-22 VITALS — BP 130/90 | HR 80 | Ht 62.0 in | Wt 253.0 lb

## 2019-08-22 DIAGNOSIS — G40409 Other generalized epilepsy and epileptic syndromes, not intractable, without status epilepticus: Secondary | ICD-10-CM

## 2019-08-22 DIAGNOSIS — F418 Other specified anxiety disorders: Secondary | ICD-10-CM

## 2019-08-22 NOTE — Progress Notes (Signed)
Date:  08/22/2019   Name:  Kristy Kirby   DOB:  15-Mar-1975   MRN:  [id number]   Chief Complaint: Depression (follow up- trying to get pt scheduled with [facility]- she went to ER and left without being seen)  Depression        This is a recurrent problem.  The current episode started more than 1 month ago (since loss son).   The onset quality is gradual.   The problem has been gradually worsening since onset.  Associated symptoms include helplessness, hopelessness, insomnia, decreased interest, sad and suicidal ideas.  Associated symptoms include no fatigue, no myalgias and no headaches.  Past treatments include SSRIs - Selective serotonin reuptake inhibitors (patient is to hold prozac until sees psychiatrist.).  Compliance with treatment is poor.  Past compliance problems: left without being seen in past.  Risk factors include major life event.    Lab Results  Component Value Date   CREATININE 1.15 (H) 08/11/2019   BUN 14 08/11/2019   NA 140 08/11/2019   K 3.2 (L) 08/11/2019   CL 105 08/11/2019   CO2 26 08/11/2019   No results found for: CHOL, HDL, LDLCALC, LDLDIRECT, TRIG, CHOLHDL Lab Results  Component Value Date   TSH 0.40 (L) 09/27/2012   No results found for: HGBA1C Lab Results  Component Value Date   WBC 10.7 (H) 08/11/2019   HGB 13.4 08/11/2019   HCT 39.7 08/11/2019   MCV 88.8 08/11/2019   PLT 222 08/11/2019   Lab Results  Component Value Date   ALT 20 06/29/2019   AST 22 06/29/2019   ALKPHOS 67 06/29/2019   BILITOT 0.9 06/29/2019     Review of Systems  Constitutional: Negative.  Negative for chills, fatigue, fever and unexpected weight change.  HENT: Negative for congestion, ear discharge, ear pain, rhinorrhea, sinus pressure, sneezing and sore throat.   Eyes: Negative for photophobia, pain, discharge, redness and itching.  Respiratory: Negative for cough, shortness of breath, wheezing and stridor.   Gastrointestinal: Negative for abdominal pain, blood in stool,  constipation, diarrhea, nausea and vomiting.  Endocrine: Negative for cold intolerance, heat intolerance, polydipsia, polyphagia and polyuria.  Genitourinary: Negative for dysuria, flank pain, frequency, hematuria, menstrual problem, pelvic pain, urgency, vaginal bleeding and vaginal discharge.  Musculoskeletal: Negative for arthralgias, back pain and myalgias.  Skin: Negative for rash.  Allergic/Immunologic: Negative for environmental allergies and food allergies.  Neurological: Negative for dizziness, weakness, light-headedness, numbness and headaches.  Hematological: Negative for adenopathy. Does not bruise/bleed easily.  Psychiatric/Behavioral: Positive for depression and suicidal ideas. Negative for dysphoric mood. The patient has insomnia. The patient is not nervous/anxious.     Patient Active Problem List   Diagnosis Date Noted  . Anxiety associated with depression 01/03/2012  . Hypertension 01/03/2012  . Insomnia 01/03/2012  . Seizure disorder, grand mal (HCC) 01/03/2012    Allergies  Allergen Reactions  . Banana   . Coconut Fatty Acids   . Penicillins   . Sulfa Antibiotics     Past Surgical History:  Procedure Laterality Date  . TUBAL LIGATION      Social History   Tobacco Use  . Smoking status: Former Smoker    Packs/day: 0.50    Types: Cigarettes  . Smokeless tobacco: Never Used  Vaping Use  . Vaping Use: Every day  Substance Use Topics  . Alcohol use: No  . Drug use: No     Medication list has been reviewed and updated.  Current Meds  Medication Sig  . albuterol (VENTOLIN HFA) 108 (90 Base) MCG/ACT inhaler Inhale 2 puffs into the lungs every 6 (six) hours as needed for wheezing or shortness of breath.  . cyclobenzaprine (FLEXERIL) 10 MG tablet Take 1 tablet (10 mg total) by mouth 3 (three) times daily as needed.  Marland Kitchen FLUoxetine (PROZAC) 10 MG capsule Take 1 capsule (10 mg total) by mouth daily.  . hydrochlorothiazide (HYDRODIURIL) 25 MG tablet Take 1  tablet (25 mg total) by mouth daily.  Marland Kitchen levETIRAcetam (KEPPRA) 500 MG tablet Take 1 tablet (500 mg total) by mouth 2 (two) times daily.  Marland Kitchen losartan (COZAAR) 25 MG tablet Take 1 tablet (25 mg total) by mouth daily.  . meloxicam (MOBIC) 7.5 MG tablet Take 1 tablet (7.5 mg total) by mouth daily.  . methocarbamol (ROBAXIN) 750 MG tablet Take 1 tablet (750 mg total) by mouth 2 (two) times daily as needed for muscle spasms.  . metoprolol tartrate (LOPRESSOR) 25 MG tablet Take 1 tablet (25 mg total) by mouth 2 (two) times daily.  . naproxen (NAPROSYN) 500 MG tablet Take 1 tablet (500 mg total) by mouth 2 (two) times daily.  . phenytoin (DILANTIN) 100 MG ER capsule Take 3 capsules (300 mg total) by mouth 2 (two) times daily.  . potassium chloride SA (KLOR-CON M20) 20 MEQ tablet Take 1 tablet (20 mEq total) by mouth daily.  . [DISCONTINUED] traMADol (ULTRAM) 50 MG tablet Take 1 tablet (50 mg total) by mouth every 12 (twelve) hours as needed. (Patient taking differently: Take 50 mg by mouth every 12 (twelve) hours as needed. ER)    PHQ 2/9 Scores 08/22/2019 08/17/2019  PHQ - 2 Score 5 4  PHQ- 9 Score 21 11    GAD 7 : Generalized Anxiety Score 08/22/2019 08/17/2019  Nervous, Anxious, on Edge 2 1  Control/stop worrying 3 3  Worry too much - different things 3 3  Trouble relaxing 2 3  Restless 2 3  Easily annoyed or irritable 3 2  Afraid - awful might happen 3 1  Total GAD 7 Score 18 16  Anxiety Difficulty Very difficult Very difficult    BP Readings from Last 3 Encounters:  08/22/19 130/90  08/17/19 (!) 140/100  08/11/19 (!) 177/126    Physical Exam Vitals and nursing note reviewed.  Constitutional:      Appearance: She is well-developed.  HENT:     Head: Normocephalic.     Right Ear: Tympanic membrane, ear canal and external ear normal.     Left Ear: Tympanic membrane, ear canal and external ear normal.  Eyes:     General: Lids are everted, no foreign bodies appreciated. No scleral icterus.        Left eye: No foreign body or hordeolum.     Conjunctiva/sclera: Conjunctivae normal.     Right eye: Right conjunctiva is not injected.     Left eye: Left conjunctiva is not injected.     Pupils: Pupils are equal, round, and reactive to light.  Neck:     Thyroid: No thyromegaly.     Vascular: No JVD.     Trachea: No tracheal deviation.  Cardiovascular:     Rate and Rhythm: Normal rate and regular rhythm.     Heart sounds: Normal heart sounds, S1 normal and S2 normal. No murmur heard.  No systolic murmur is present.  No diastolic murmur is present.  No friction rub. No gallop. No S3 or S4 sounds.   Pulmonary:  Effort: Pulmonary effort is normal. No respiratory distress.     Breath sounds: Normal breath sounds. No wheezing, rhonchi or rales.  Abdominal:     General: Bowel sounds are normal.     Palpations: Abdomen is soft. There is no mass.     Tenderness: There is no abdominal tenderness. There is no guarding or rebound.  Musculoskeletal:        General: No tenderness. Normal range of motion.     Cervical back: Normal range of motion and neck supple.  Lymphadenopathy:     Cervical: No cervical adenopathy.  Skin:    General: Skin is warm.     Findings: No rash.  Neurological:     Mental Status: She is alert and oriented to person, place, and time.     Cranial Nerves: No cranial nerve deficit.     Deep Tendon Reflexes: Reflexes normal.  Psychiatric:        Mood and Affect: Mood is not anxious or depressed.     Wt Readings from Last 3 Encounters:  08/22/19 253 lb (114.8 kg)  08/17/19 254 lb (115.2 kg)  08/11/19 238 lb 1.6 oz (108 kg)    BP 130/90   Pulse 80   Ht 5\' 2"  (1.575 m)   Wt 253 lb (114.8 kg)   BMI 46.27 kg/m   Assessment and Plan:  1. Anxiety associated with depression Patient returns today with anxiety associated with depression.  Is well-documented in nurse's note of yesterday's encounter by phone.  Unfortunately patient left without being seen by  the psychiatrist at the local emergency room.  We have been unable to get patient to be seen [facility].  Patient has been given to walk-in psychiatry concerns that she will investigate today or will go to a local Medical Center at that has psychiatry capabilities.  Patient's also been instructed not to take her fluoxetine until she has been evaluated by psychiatry to determine the stability and that this particular class of drugs will be indicated.  2. Seizure disorder, grand mal (HCC) Uncertain if new or chronic in nature.  Patient has had no other seizure disorder since the last.  Unfortunately both circumstances that she went to the emergency room she left without being seen.  These apparently were witnessed.  Patient is continuing on her medication until she is to be evaluated on Monday by neurology for continuous and further evaluation.  Patient has been instructed not to drive until evaluation.

## 2019-08-27 ENCOUNTER — Telehealth: Payer: Self-pay

## 2019-08-27 ENCOUNTER — Other Ambulatory Visit: Payer: Self-pay

## 2019-08-27 DIAGNOSIS — I10 Essential (primary) hypertension: Secondary | ICD-10-CM

## 2019-08-27 MED ORDER — METOPROLOL TARTRATE 25 MG PO TABS: 25.0000 mg | ORAL_TABLET | Freq: Two times a day (BID) | ORAL | 1 refills | Status: DC

## 2019-08-27 NOTE — Telephone Encounter (Signed)
I called [name] and explained that this pt is a new patient that we saw with no intentions of putting her out of work. The patient was given info to walk-in psychiatrist and they could help her, if they saw necessary, to put her out of work. She was checked into ER, where she left without being seen. Baxter Hire documented that Kristy Kirby would be unable to complete paperwork

## 2019-08-28 ENCOUNTER — Telehealth: Payer: Self-pay | Admitting: Family Medicine

## 2019-08-28 NOTE — Telephone Encounter (Signed)
Individual has been contacted regarding ED referral and has stated that they are not interested. No further attempts to contact individual will be made.

## 2019-08-30 DIAGNOSIS — G629 Polyneuropathy, unspecified: Secondary | ICD-10-CM | POA: Insufficient documentation

## 2019-08-30 DIAGNOSIS — R569 Unspecified convulsions: Secondary | ICD-10-CM | POA: Insufficient documentation

## 2019-08-30 DIAGNOSIS — Z8659 Personal history of other mental and behavioral disorders: Secondary | ICD-10-CM | POA: Insufficient documentation

## 2019-09-07 ENCOUNTER — Other Ambulatory Visit: Payer: Self-pay

## 2019-09-07 ENCOUNTER — Encounter: Payer: Self-pay | Admitting: Advanced Practice Midwife

## 2019-09-07 ENCOUNTER — Other Ambulatory Visit (HOSPITAL_COMMUNITY)
Admission: RE | Admit: 2019-09-07 | Discharge: 2019-09-07 | Disposition: A | Discharge status: Home / Self Care | Payer: BC Managed Care – PPO | Source: Ambulatory Visit | Attending: Advanced Practice Midwife | Admitting: Advanced Practice Midwife

## 2019-09-07 ENCOUNTER — Ambulatory Visit (INDEPENDENT_AMBULATORY_CARE_PROVIDER_SITE_OTHER): Payer: BC Managed Care – PPO | Admitting: Advanced Practice Midwife

## 2019-09-07 VITALS — BP 145/90 | Ht 63.0 in | Wt 254.2 lb

## 2019-09-07 DIAGNOSIS — Z124 Encounter for screening for malignant neoplasm of cervix: Secondary | ICD-10-CM | POA: Insufficient documentation

## 2019-09-07 DIAGNOSIS — Z113 Encounter for screening for infections with a predominantly sexual mode of transmission: Secondary | ICD-10-CM | POA: Diagnosis present

## 2019-09-07 NOTE — Progress Notes (Addendum)
Patient ID: Kristy Kirby, female   DOB: 06-06-1975, 44 y.o.   MRN: [id number]  Reason for Consult: Gynecologic Exam   Referred by [name], MD  Subjective:  HPI:  Kristy Kirby is a 45 y.o. female being seen today for PAP smear. She was referred by her PCP who she recently was seen by for an annual exam. Her last PAP was 20 years ago and was "abnormal" per her report. She has not had a follow up since. She has not been sexually active since 2018 due to incarceration of her partner. She reports not having a period between September of last year and now. LMP 08/23/19. She admits increased stress due to the loss of 2 of her sons in recent years. We briefly discussed possible peri menopausal changes and the role of stress on hormone function. She accepts STD testing today.  Past Medical History:  Diagnosis Date  . Anxiety   . Arthritis    Rheumatoid  . Depression   . HTN (hypertension)   . Sciatica   . Seizures (HCC)    Family History  Problem Relation Age of Onset  . Hypertension Mother   . Diabetes Mother   . Stroke Mother   . Hypertension Father   . Diabetes Father   . Heart disease Father   . Stroke Father   . Diabetes Sister   . Stroke Paternal Grandmother   . Stroke Paternal Grandfather    Past Surgical History:  Procedure Laterality Date  . TUBAL LIGATION      Short Social History:  Social History   Tobacco Use  . Smoking status: Former Smoker    Packs/day: 0.50    Types: Cigarettes  . Smokeless tobacco: Never Used  Substance Use Topics  . Alcohol use: No    Allergies  Allergen Reactions  . Banana   . Coconut Fatty Acids   . Penicillins   . Sulfa Antibiotics Hives    GI upset   . Coconut Oil Rash    Current Outpatient Medications  Medication Sig Dispense Refill  . albuterol (VENTOLIN HFA) 108 (90 Base) MCG/ACT inhaler Inhale 2 puffs into the lungs every 6 (six) hours as needed for wheezing or shortness of breath. 1 Inhaler 2  . clonazePAM  (KLONOPIN) 0.5 MG tablet Take by mouth.    . cyclobenzaprine (FLEXERIL) 10 MG tablet Take 1 tablet (10 mg total) by mouth 3 (three) times daily as needed. 15 tablet 0  . FLUoxetine (PROZAC) 10 MG capsule Take 1 capsule (10 mg total) by mouth daily. 30 capsule 1  . Fluoxetine HCl, PMDD, 10 MG TABS Take by mouth.    . hydrochlorothiazide (HYDRODIURIL) 25 MG tablet Take 1 tablet (25 mg total) by mouth daily. 30 tablet 2  . lamoTRIgine (LAMICTAL) 25 MG tablet Wk1:1tab night wk2:1tab 2xday wk3:1tab AM 2tabsPM wk4:2tabs 2xday wk5:2tabs AM 3tabs PM wk6:3tabs 2xday wk7:3tabs AM 4tabs PM, wk8:4tab2xday    . levETIRAcetam (KEPPRA) 500 MG tablet Take 1 tablet (500 mg total) by mouth 2 (two) times daily. 60 tablet 1  . lisinopril (ZESTRIL) 10 MG tablet Take by mouth.    . losartan (COZAAR) 25 MG tablet Take 1 tablet (25 mg total) by mouth daily. 30 tablet 11  . meloxicam (MOBIC) 7.5 MG tablet Take 1 tablet (7.5 mg total) by mouth daily. 30 tablet 2  . methocarbamol (ROBAXIN) 750 MG tablet Take 1 tablet (750 mg total) by mouth 2 (two) times daily as needed for  muscle spasms. 10 tablet 0  . metoprolol tartrate (LOPRESSOR) 25 MG tablet Take 1 tablet (25 mg total) by mouth 2 (two) times daily. 60 tablet 1  . naproxen (NAPROSYN) 500 MG tablet Take 1 tablet (500 mg total) by mouth 2 (two) times daily. 30 tablet 0  . ondansetron (ZOFRAN) 4 MG tablet Take 4-8 mg by mouth every 8 (eight) hours as needed.    . phenytoin (DILANTIN) 100 MG ER capsule Take 3 capsules (300 mg total) by mouth 2 (two) times daily. 180 capsule 3  . potassium chloride SA (KLOR-CON M20) 20 MEQ tablet Take 1 tablet (20 mEq total) by mouth daily. 90 tablet 1   No current facility-administered medications for this visit.    Review of Systems  Constitutional: Positive for malaise/fatigue. Negative for chills and fever.  HENT: Negative for congestion, ear discharge, ear pain, hearing loss, sinus pain and sore throat.   Eyes: Negative for blurred  vision and double vision.  Respiratory: Negative for cough, shortness of breath and wheezing.   Cardiovascular: Negative for chest pain, palpitations and leg swelling.  Gastrointestinal: Positive for constipation and diarrhea. Negative for abdominal pain, blood in stool, heartburn, melena, nausea and vomiting.  Genitourinary: Negative for dysuria, flank pain, frequency, hematuria and urgency.       Positive for irregular menstrual cycle  Musculoskeletal: Negative for back pain, joint pain and myalgias.  Skin: Negative for itching and rash.  Neurological: Positive for dizziness and headaches. Negative for tingling, tremors, sensory change, speech change, focal weakness, seizures, loss of consciousness and weakness.  Endo/Heme/Allergies: Negative for environmental allergies. Does not bruise/bleed easily.       Positive for hot flashes  Psychiatric/Behavioral: Negative for depression, hallucinations, memory loss, substance abuse and suicidal ideas. The patient is not nervous/anxious and does not have insomnia.        Positive for anxiety and depression        Objective:  Objective   Vitals:   09/07/19 0928  BP: (!) 145/90  Weight: (!) 254 lb 3.2 oz (115.3 kg)  Height: 5\' 3"  (1.6 m)   Body mass index is 45.03 kg/m. Constitutional: Well nourished, well developed female in no acute distress.  HEENT: normal Skin: Warm and dry.  Respiratory: Clear to auscultation bilateral. Normal respiratory effort Neuro: DTRs 2+, Cranial nerves grossly intact Psych: Alert and Oriented x3. No memory deficits. Normal mood and affect.  MS: normal gait, normal bilateral lower extremity ROM/strength/stability.  Pelvic exam:  is limited by body habitus EGBUS: within normal limits Vagina: within normal limits and with normal mucosa  Cervix: able to see edge of cervix which appears normal, unable to adequately collect specimen using speculum. Collection done manually.   Assessment/Plan:     44 y.o.  female- cervical cancer screening and STD screening  PAPtima collectd Follow up as needed after lab results   55 CNM [facility] 09/07/2019, 10:31 AM

## 2019-09-11 ENCOUNTER — Other Ambulatory Visit: Payer: Self-pay | Admitting: Advanced Practice Midwife

## 2019-09-11 DIAGNOSIS — A5901 Trichomonal vulvovaginitis: Secondary | ICD-10-CM

## 2019-09-11 LAB — CYTOLOGY - PAP
Adequacy: ABSENT
Chlamydia: NEGATIVE
Comment: NEGATIVE
Comment: NEGATIVE
Comment: NEGATIVE
Comment: NORMAL
Diagnosis: NEGATIVE
High risk HPV: NEGATIVE
Neisseria Gonorrhea: NEGATIVE
Trichomonas: POSITIVE — AB

## 2019-09-11 MED ORDER — METRONIDAZOLE 500 MG PO TABS: 2000.0000 mg | ORAL_TABLET | Freq: Once | ORAL | 0 refills | Status: AC

## 2019-09-11 NOTE — Progress Notes (Signed)
MyChart message sent to patient regarding PAP result (trich) and Rx sent to [facility].

## 2019-09-13 ENCOUNTER — Other Ambulatory Visit: Payer: Self-pay

## 2019-09-13 ENCOUNTER — Ambulatory Visit (INDEPENDENT_AMBULATORY_CARE_PROVIDER_SITE_OTHER): Payer: BC Managed Care – PPO | Admitting: Family Medicine

## 2019-09-13 ENCOUNTER — Encounter: Payer: Self-pay | Admitting: Family Medicine

## 2019-09-13 VITALS — BP 160/110 | HR 76 | Ht 63.0 in | Wt 255.0 lb

## 2019-09-13 DIAGNOSIS — I10 Essential (primary) hypertension: Secondary | ICD-10-CM | POA: Diagnosis not present

## 2019-09-13 DIAGNOSIS — F418 Other specified anxiety disorders: Secondary | ICD-10-CM

## 2019-09-13 DIAGNOSIS — J4520 Mild intermittent asthma, uncomplicated: Secondary | ICD-10-CM | POA: Diagnosis not present

## 2019-09-13 MED ORDER — ALBUTEROL SULFATE HFA 108 (90 BASE) MCG/ACT IN AERS: 1.0000 | INHALATION_SPRAY | Freq: Four times a day (QID) | RESPIRATORY_TRACT | 11 refills | Status: AC | PRN

## 2019-09-13 MED ORDER — LOSARTAN POTASSIUM 50 MG PO TABS: 50.0000 mg | ORAL_TABLET | Freq: Every day | ORAL | 0 refills | Status: DC

## 2019-09-13 MED ORDER — METOPROLOL TARTRATE 25 MG PO TABS: 25.0000 mg | ORAL_TABLET | Freq: Two times a day (BID) | ORAL | 1 refills | Status: AC

## 2019-09-13 MED ORDER — HYDROCHLOROTHIAZIDE 25 MG PO TABS: 25.0000 mg | ORAL_TABLET | Freq: Every day | ORAL | 2 refills | Status: AC

## 2019-09-13 MED ORDER — FLUOXETINE HCL 10 MG PO CAPS: 10.0000 mg | ORAL_CAPSULE | Freq: Every day | ORAL | 1 refills | Status: DC

## 2019-09-13 NOTE — Progress Notes (Signed)
Date:  09/13/2019   Name:  Kristy Kirby   DOB:  September 04, 1975   MRN:  [id number]   Chief Complaint: Hypertension and Asthma (needs inhaler refilled)  Hypertension This is a chronic problem. The current episode started more than 1 year ago. The problem has been gradually improving since onset. The problem is uncontrolled. Pertinent negatives include no anxiety, blurred vision, chest pain, headaches, malaise/fatigue, neck pain, orthopnea, palpitations, peripheral edema, PND, shortness of breath or sweats. There are no associated agents to hypertension. Past treatments include angiotensin blockers and diuretics. The current treatment provides no improvement. There is no history of angina, kidney disease, CAD/MI, CVA, heart failure, left ventricular hypertrophy, PVD or retinopathy.  Asthma There is no chest tightness, cough, difficulty breathing, frequent throat clearing, hemoptysis, hoarse voice, shortness of breath, sputum production or wheezing. This is a chronic problem. The problem occurs intermittently. The problem has been waxing and waning. Pertinent negatives include no chest pain, ear congestion, ear pain, fever, headaches, malaise/fatigue, myalgias, nasal congestion, PND, sore throat or sweats. Her symptoms are alleviated by beta-agonist. She reports moderate improvement on treatment. Her past medical history is significant for asthma.    Lab Results  Component Value Date   CREATININE 1.15 (H) 08/11/2019   BUN 14 08/11/2019   NA 140 08/11/2019   K 3.2 (L) 08/11/2019   CL 105 08/11/2019   CO2 26 08/11/2019   No results found for: CHOL, HDL, LDLCALC, LDLDIRECT, TRIG, CHOLHDL Lab Results  Component Value Date   TSH 0.40 (L) 09/27/2012   No results found for: HGBA1C Lab Results  Component Value Date   WBC 10.7 (H) 08/11/2019   HGB 13.4 08/11/2019   HCT 39.7 08/11/2019   MCV 88.8 08/11/2019   PLT 222 08/11/2019   Lab Results  Component Value Date   ALT 20 06/29/2019   AST  22 06/29/2019   ALKPHOS 67 06/29/2019   BILITOT 0.9 06/29/2019     Review of Systems  Constitutional: Negative for chills, fever and malaise/fatigue.  HENT: Negative for drooling, ear discharge, ear pain, hoarse voice and sore throat.   Eyes: Negative for blurred vision.  Respiratory: Negative for cough, hemoptysis, sputum production, shortness of breath and wheezing.   Cardiovascular: Negative for chest pain, palpitations, orthopnea, leg swelling and PND.  Gastrointestinal: Negative for abdominal pain, blood in stool, constipation, diarrhea and nausea.  Endocrine: Negative for polydipsia.  Genitourinary: Negative for dysuria, frequency, hematuria and urgency.  Musculoskeletal: Negative for back pain, myalgias and neck pain.  Skin: Negative for rash.  Allergic/Immunologic: Negative for environmental allergies.  Neurological: Negative for dizziness and headaches.  Hematological: Does not bruise/bleed easily.  Psychiatric/Behavioral: Negative for suicidal ideas. The patient is not nervous/anxious.     Patient Active Problem List   Diagnosis Date Noted  . History of depression 08/30/2019  . Neuropathy 08/30/2019  . Seizure (HCC) 08/30/2019  . Anxiety associated with depression 01/03/2012  . Hypertension 01/03/2012  . Insomnia 01/03/2012  . Seizure disorder, grand mal (HCC) 01/03/2012  . Uterine adnexal pain 01/03/2012    Allergies  Allergen Reactions  . Banana   . Coconut Fatty Acids   . Penicillins   . Sulfa Antibiotics Hives    GI upset   . Coconut Oil Rash    Past Surgical History:  Procedure Laterality Date  . TUBAL LIGATION      Social History   Tobacco Use  . Smoking status: Former Smoker    Packs/day: 0.50  Types: Cigarettes  . Smokeless tobacco: Never Used  Vaping Use  . Vaping Use: Every day  Substance Use Topics  . Alcohol use: No  . Drug use: No     Medication list has been reviewed and updated.  Current Meds  Medication Sig  . albuterol  (VENTOLIN HFA) 108 (90 Base) MCG/ACT inhaler Inhale 2 puffs into the lungs every 6 (six) hours as needed for wheezing or shortness of breath.  . cyclobenzaprine (FLEXERIL) 10 MG tablet Take 1 tablet (10 mg total) by mouth 3 (three) times daily as needed.  Marland Kitchen FLUoxetine (PROZAC) 10 MG capsule Take 1 capsule (10 mg total) by mouth daily.  . hydrochlorothiazide (HYDRODIURIL) 25 MG tablet Take 1 tablet (25 mg total) by mouth daily.  Marland Kitchen lamoTRIgine (LAMICTAL) 25 MG tablet Wk1:1tab night wk2:1tab 2xday wk3:1tab AM 2tabsPM wk4:2tabs 2xday wk5:2tabs AM 3tabs PM wk6:3tabs 2xday wk7:3tabs AM 4tabs PM, wk8:4tab2xday  . levETIRAcetam (KEPPRA) 500 MG tablet Take 1 tablet (500 mg total) by mouth 2 (two) times daily.  Marland Kitchen lisinopril (ZESTRIL) 10 MG tablet Take by mouth.  . losartan (COZAAR) 25 MG tablet Take 1 tablet (25 mg total) by mouth daily.  . meloxicam (MOBIC) 7.5 MG tablet Take 1 tablet (7.5 mg total) by mouth daily.  . methocarbamol (ROBAXIN) 750 MG tablet Take 1 tablet (750 mg total) by mouth 2 (two) times daily as needed for muscle spasms.  . metoprolol tartrate (LOPRESSOR) 25 MG tablet Take 1 tablet (25 mg total) by mouth 2 (two) times daily.  . naproxen (NAPROSYN) 500 MG tablet Take 1 tablet (500 mg total) by mouth 2 (two) times daily.  . phenytoin (DILANTIN) 100 MG ER capsule Take 3 capsules (300 mg total) by mouth 2 (two) times daily.  . potassium chloride SA (KLOR-CON M20) 20 MEQ tablet Take 1 tablet (20 mEq total) by mouth daily.    PHQ 2/9 Scores 08/22/2019 08/17/2019  PHQ - 2 Score 5 4  PHQ- 9 Score 21 11    GAD 7 : Generalized Anxiety Score 08/22/2019 08/17/2019  Nervous, Anxious, on Edge 2 1  Control/stop worrying 3 3  Worry too much - different things 3 3  Trouble relaxing 2 3  Restless 2 3  Easily annoyed or irritable 3 2  Afraid - awful might happen 3 1  Total GAD 7 Score 18 16  Anxiety Difficulty Very difficult Very difficult    BP Readings from Last 3 Encounters:  09/13/19 (!)  170/110  09/07/19 (!) 145/90  08/22/19 130/90    Physical Exam Vitals and nursing note reviewed.  Constitutional:      Appearance: She is well-developed.  HENT:     Head: Normocephalic.     Right Ear: Tympanic membrane, ear canal and external ear normal.     Left Ear: Tympanic membrane, ear canal and external ear normal.  Eyes:     General: Lids are everted, no foreign bodies appreciated. No scleral icterus.       Left eye: No foreign body or hordeolum.     Conjunctiva/sclera: Conjunctivae normal.     Right eye: Right conjunctiva is not injected.     Left eye: Left conjunctiva is not injected.     Pupils: Pupils are equal, round, and reactive to light.  Neck:     Thyroid: No thyromegaly.     Vascular: No JVD.     Trachea: No tracheal deviation.  Cardiovascular:     Rate and Rhythm: Normal rate and regular rhythm.  Heart sounds: Normal heart sounds, S1 normal and S2 normal. No murmur heard.  No systolic murmur is present.  No diastolic murmur is present.  No friction rub. No gallop. No S3 or S4 sounds.   Pulmonary:     Effort: Pulmonary effort is normal. No respiratory distress.     Breath sounds: Normal breath sounds. No decreased breath sounds, wheezing, rhonchi or rales.  Abdominal:     General: Bowel sounds are normal.     Palpations: Abdomen is soft. There is no mass.     Tenderness: There is no abdominal tenderness. There is no guarding or rebound.  Musculoskeletal:        General: No tenderness. Normal range of motion.     Cervical back: Normal range of motion and neck supple.  Lymphadenopathy:     Cervical: No cervical adenopathy.  Skin:    General: Skin is warm.     Findings: No rash.  Neurological:     Mental Status: She is alert and oriented to person, place, and time.     Cranial Nerves: No cranial nerve deficit.     Deep Tendon Reflexes: Reflexes normal.  Psychiatric:        Mood and Affect: Mood is not anxious or depressed.     Wt Readings from  Last 3 Encounters:  09/13/19 (!) 255 lb (115.7 kg)  09/07/19 (!) 254 lb 3.2 oz (115.3 kg)  08/22/19 253 lb (114.8 kg)    BP (!) 170/110   Pulse 76   Ht 5\' 3"  (1.6 m)   Wt (!) 255 lb (115.7 kg)   LMP 08/23/2019   BMI 45.17 kg/m   Assessment and Plan: 1. Anxiety associated with depression Chronic.  Controlled.  Stable.  PHQ and gad has improved on current dosing of Prozac 10 mg once a day.  We will continue such and will recheck. - FLUoxetine (PROZAC) 10 MG capsule; Take 1 capsule (10 mg total) by mouth daily.  Dispense: 30 capsule; Refill: 1  2. Essential hypertension Chronic.  Controlled.  Stable.  Continue metoprolol 25 mg 1 twice a day, hydrochlorothiazide 25 mg 1 a day and losartan 50 mg once a day. - metoprolol tartrate (LOPRESSOR) 25 MG tablet; Take 1 tablet (25 mg total) by mouth 2 (two) times daily.  Dispense: 60 tablet; Refill: 1 - hydrochlorothiazide (HYDRODIURIL) 25 MG tablet; Take 1 tablet (25 mg total) by mouth daily.  Dispense: 30 tablet; Refill: 2 - losartan (COZAAR) 50 MG tablet; Take 1 tablet (50 mg total) by mouth daily.  Dispense: 60 tablet; Refill: 0  3. Mild intermittent asthma without complication Chronic.  Controlled.  Intermittent and mild.  Relatively stable on albuterol inhaler 1 to 2 puffs every 6 hours. - albuterol (VENTOLIN HFA) 108 (90 Base) MCG/ACT inhaler; Inhale 1-2 puffs into the lungs every 6 (six) hours as needed for wheezing or shortness of breath.  Dispense: 6.7 g; Refill: 11

## 2019-09-24 ENCOUNTER — Other Ambulatory Visit: Payer: Self-pay | Admitting: Emergency Medicine

## 2019-09-25 NOTE — Telephone Encounter (Signed)
Patient seen in ER by Dr. Daryel November.  Not a [facility] clinic patient.  Please address.  Thank you.

## 2019-10-11 ENCOUNTER — Ambulatory Visit: Payer: BC Managed Care – PPO | Admitting: Family Medicine

## 2019-10-16 ENCOUNTER — Ambulatory Visit: Payer: BC Managed Care – PPO | Admitting: Family Medicine

## 2019-11-21 ENCOUNTER — Other Ambulatory Visit: Payer: Self-pay | Admitting: Family Medicine

## 2019-11-21 DIAGNOSIS — F418 Other specified anxiety disorders: Secondary | ICD-10-CM

## 2019-11-21 NOTE — Telephone Encounter (Signed)
Requested Prescriptions  Pending Prescriptions Disp Refills  . meloxicam (MOBIC) 7.5 MG tablet [Pharmacy Med Name: Meloxicam 7.5 MG Oral Tablet] 30 tablet 0    Sig: Take 1 tablet by mouth once daily     Analgesics:  COX2 Inhibitors Failed - 11/21/2019  1:08 PM      Failed - Cr in normal range and within 360 days    Creatinine  Date Value Ref Range Status  06/14/2014 1.01 (H) mg/dL Final    Comment:    3.32-9.51 NOTE: New Reference Range  04/23/14    Creatinine, Ser  Date Value Ref Range Status  08/11/2019 1.15 (H) 0.44 - 1.00 mg/dL Final         Passed - HGB in normal range and within 360 days    Hemoglobin  Date Value Ref Range Status  08/11/2019 13.4 12.0 - 15.0 g/dL Final   HGB  Date Value Ref Range Status  06/28/2013 13.8 12.0 - 16.0 g/dL Final         Passed - Patient is not pregnant      Passed - Valid encounter within last 12 months    Recent Outpatient Visits          2 months ago Essential hypertension   [facility] [name], MD   3 months ago Anxiety associated with depression   [facility] [name], MD   3 months ago Establishing care with new doctor, encounter for   [facility] [name], MD             . FLUoxetine (PROZAC) 10 MG capsule [Pharmacy Med Name: FLUoxetine HCl 10 MG Oral Capsule] 30 capsule 0    Sig: Take 1 capsule by mouth once daily     Psychiatry:  Antidepressants - SSRI Passed - 11/21/2019  1:08 PM      Passed - Completed PHQ-2 or PHQ-9 in the last 360 days.      Passed - Valid encounter within last 6 months    Recent Outpatient Visits          2 months ago Essential hypertension   [facility] [name], MD   3 months ago Anxiety associated with depression   [facility] [name], MD   3 months ago Establishing care with new doctor, encounter for   [facility] [name], MD             Phone call to pt.  Advised of need for  f/u appt. With PCP.  Pt. Agreed to schedule, then said "I am at work and I will call back later."  Gave 30 day courtesy refill at this time.

## 2019-12-11 ENCOUNTER — Other Ambulatory Visit: Payer: Self-pay

## 2019-12-11 ENCOUNTER — Emergency Department
Admission: EM | Admit: 2019-12-11 | Discharge: 2019-12-11 | Disposition: A | Discharge status: Home / Self Care | Payer: BC Managed Care – PPO | Attending: Emergency Medicine | Admitting: Emergency Medicine

## 2019-12-11 DIAGNOSIS — M5441 Lumbago with sciatica, right side: Secondary | ICD-10-CM | POA: Insufficient documentation

## 2019-12-11 DIAGNOSIS — I10 Essential (primary) hypertension: Secondary | ICD-10-CM | POA: Insufficient documentation

## 2019-12-11 DIAGNOSIS — Z79899 Other long term (current) drug therapy: Secondary | ICD-10-CM | POA: Insufficient documentation

## 2019-12-11 DIAGNOSIS — Z87891 Personal history of nicotine dependence: Secondary | ICD-10-CM | POA: Insufficient documentation

## 2019-12-11 MED ORDER — ORPHENADRINE CITRATE 30 MG/ML IJ SOLN
60.0000 mg | Freq: Once | INTRAMUSCULAR | Status: AC
  Administered 2019-12-11: 60 mg via INTRAMUSCULAR
  Filled 2019-12-11: qty 2

## 2019-12-11 MED ORDER — MELOXICAM 15 MG PO TABS: 15.0000 mg | ORAL_TABLET | Freq: Every day | ORAL | 0 refills | Status: DC

## 2019-12-11 MED ORDER — CLONIDINE HCL 0.1 MG PO TABS
0.2000 mg | ORAL_TABLET | Freq: Once | ORAL | Status: AC
  Administered 2019-12-11: 0.2 mg via ORAL
  Filled 2019-12-11: qty 2

## 2019-12-11 MED ORDER — KETOROLAC TROMETHAMINE 60 MG/2ML IM SOLN
30.0000 mg | Freq: Once | INTRAMUSCULAR | Status: AC
  Administered 2019-12-11: 30 mg via INTRAMUSCULAR
  Filled 2019-12-11: qty 2

## 2019-12-11 MED ORDER — CYCLOBENZAPRINE HCL 10 MG PO TABS: 10.0000 mg | ORAL_TABLET | Freq: Three times a day (TID) | ORAL | 0 refills | Status: DC | PRN

## 2019-12-11 NOTE — Discharge Instructions (Signed)
Please take your blood pressure medications when you get home.  Call to schedule an appointment with primary care.  Return to the ER for symptoms of concern

## 2019-12-11 NOTE — ED Provider Notes (Signed)
[facility] Emergency Department Provider Note ____________________________________________  Time seen: Approximately 8:54 AM  I have reviewed the triage vital signs and the nursing notes.  HISTORY  Chief Complaint Back Pain   HPI Kristy JENNING is a 44 y.o. female with a history of hypertension, seizures, anxiety, depression, and sciatica presents to the emergency department for treatment of back pain that started Sunday while at work. Pain initially only at the top of her "butt crack" but has moved to the middle of right buttock and is shooting down the right leg. No relief with rest and ibuprofen.   Past Medical History:  Diagnosis Date  . Anxiety   . Arthritis    Rheumatoid  . Depression   . HTN (hypertension)   . Sciatica   . Seizures [facility])     Patient Active Problem List   Diagnosis Date Noted  . History of depression 08/30/2019  . Neuropathy 08/30/2019  . Seizure (HCC) 08/30/2019  . Anxiety associated with depression 01/03/2012  . Hypertension 01/03/2012  . Insomnia 01/03/2012  . Seizure disorder, grand mal (HCC) 01/03/2012  . Uterine adnexal pain 01/03/2012    Past Surgical History:  Procedure Laterality Date  . TUBAL LIGATION      Prior to Admission medications   Medication Sig Start Date End Date Taking? Authorizing Provider  albuterol (VENTOLIN HFA) 108 (90 Base) MCG/ACT inhaler Inhale 1-2 puffs into the lungs every 6 (six) hours as needed for wheezing or shortness of breath. 09/13/19   [name], MD  cyclobenzaprine (FLEXERIL) 10 MG tablet Take 1 tablet (10 mg total) by mouth 3 (three) times daily as needed for muscle spasms. 12/11/19   Domnique Vanegas, Rulon Eisenmenger B, FNP  FLUoxetine (PROZAC) 10 MG capsule Take 1 capsule by mouth once daily 11/21/19   [name], MD  hydrochlorothiazide (HYDRODIURIL) 25 MG tablet Take 1 tablet (25 mg total) by mouth daily. 09/13/19   [name], MD  lamoTRIgine (LAMICTAL) 25 MG tablet Wk1:1tab night  wk2:1tab 2xday wk3:1tab AM 2tabsPM wk4:2tabs 2xday wk5:2tabs AM 3tabs PM wk6:3tabs 2xday wk7:3tabs AM 4tabs PM, wk8:4tab2xday 08/27/19   [provider]  levETIRAcetam (KEPPRA) 500 MG tablet Take 1 tablet (500 mg total) by mouth 2 (two) times daily. 08/17/19 09/16/19  [name], MD  lisinopril (ZESTRIL) 10 MG tablet Take by mouth. 04/18/17   [provider]  losartan (COZAAR) 50 MG tablet Take 1 tablet (50 mg total) by mouth daily. 09/13/19   [name], MD  meloxicam (MOBIC) 15 MG tablet Take 1 tablet (15 mg total) by mouth daily. 12/11/19   Jumanah Hynson, Rulon Eisenmenger B, FNP  methocarbamol (ROBAXIN) 750 MG tablet Take 1 tablet (750 mg total) by mouth 2 (two) times daily as needed for muscle spasms. 10/06/18   Verlee Monte, NP  metoprolol tartrate (LOPRESSOR) 25 MG tablet Take 1 tablet (25 mg total) by mouth 2 (two) times daily. 09/13/19   [name], MD  phenytoin (DILANTIN) 100 MG ER capsule Take 3 capsules (300 mg total) by mouth 2 (two) times daily. 06/29/19 10/27/19  Emily Filbert, MD  potassium chloride SA (KLOR-CON M20) 20 MEQ tablet Take 1 tablet (20 mEq total) by mouth daily. 08/17/19   [name], MD    Allergies Banana, Coconut fatty acids, Penicillins, Sulfa antibiotics, and Coconut oil  Family History  Problem Relation Age of Onset  . Hypertension Mother   . Diabetes Mother   . Stroke Mother   .  Hypertension Father   . Diabetes Father   . Heart disease Father   . Stroke Father   . Diabetes Sister   . Stroke Paternal Grandmother   . Stroke Paternal Grandfather     Social History Social History   Tobacco Use  . Smoking status: Former Smoker    Packs/day: 0.50    Types: Cigarettes  . Smokeless tobacco: Never Used  Vaping Use  . Vaping Use: Every day  Substance Use Topics  . Alcohol use: No  . Drug use: No    Review of Systems Constitutional: Well appearing. Respiratory: Negative for dyspnea. Cardiovascular: Negative for change in skin  temperature or color. Musculoskeletal:   Negative for chronic steroid use   Negative for trauma in the presence of osteoporosis  Negative for age over 86 and trauma.  Negative for constitutional symptoms, or history of cancer  Negative for pain worse at night. Skin: Negative for rash, lesion, or wound.  Genitourinary: Negative for urinary retention. Rectal: Negative for fecal incontinence or new onset constipation/bowel habit changes. Hematological/Immunilogical: Negative for immunosuppression, IV drug use, or fever Neurological: Positive for burning, tingling, numb, electric, radiating pain in the right lower extremity.                        Negative for saddle anesthesia.                        Negative for focal neurologic deficit, progressive or disabling symptoms ____________________________________________   PHYSICAL EXAM:  VITAL SIGNS: ED Triage Vitals  Enc Vitals Group     BP 12/11/19 0808 (!) 194/137     Pulse Rate 12/11/19 0808 96     Resp 12/11/19 0808 19     Temp 12/11/19 0808 98.3 F (36.8 C)     Temp Source 12/11/19 0808 Oral     SpO2 12/11/19 0808 99 %     Weight 12/11/19 0812 240 lb (108.9 kg)     Height 12/11/19 0812 5\' 1"  (1.549 m)     Head Circumference --      Peak Flow --      Pain Score 12/11/19 0812 10     Pain Loc --      Pain Edu? --      Excl. in GC? --     Constitutional: Alert and oriented. Well appearing and in no acute distress. Eyes: Conjunctivae are clear without discharge or drainage.  Head: Atraumatic. Neck: Full, active range of motion. Respiratory: Respirations even and unlabored. Musculoskeletal: Limited ROM of the back and extremities secondary to pain, Strength 5/5 of the lower extremities as tested. Neurologic: Reflexes of the lower extremities are 2+. Positive for straight leg raise on the right side. Skin: Atraumatic.  Psychiatric: Behavior and affect are normal.  ____________________________________________   LABS (all  labs ordered are listed, but only abnormal results are displayed)  Labs Reviewed - No data to display ____________________________________________  RADIOLOGY  Not indicated. ____________________________________________   PROCEDURES  Procedure(s) performed:  Procedures ____________________________________________   INITIAL IMPRESSION / ASSESSMENT AND PLAN / ED COURSE  Kristy Kirby is a 44 y.o. female presenting to the emergency department for treatment and evaluation of right side back pain that radiates down the right lower extremity.  Symptoms and exam are consistent with sciatica.  She will be given Norflex and Toradol injections.  Also of note, patient's very hypertensive on triage vitals.  Repeat blood pressure is  174/123.  Patient states that she has not taken her blood pressure medications in a couple of days.  She does have them at home and has enough medicine to last to mid November.  She states that she lost her insurance and has not been able to see primary care recently.  She plans to get an appointment at one of the clinics before she runs out of medicine. We discussed the importance of taking her medications daily and as prescribed as long term and serious illness may result of hypertension in this range. She is currently asymptomatic. Clonidine ordered for now and she is to take her medication when she gets home.    Medications  cloNIDine (CATAPRES) tablet 0.2 mg (0.2 mg Oral Given 12/11/19 0914)  ketorolac (TORADOL) injection 30 mg (30 mg Intramuscular Given 12/11/19 0914)  orphenadrine (NORFLEX) injection 60 mg (60 mg Intramuscular Given 12/11/19 0915)    ED Discharge Orders         Ordered    cyclobenzaprine (FLEXERIL) 10 MG tablet  3 times daily PRN        12/11/19 1008    meloxicam (MOBIC) 15 MG tablet  Daily        12/11/19 1008           Pertinent labs & imaging results that were available during my care of the patient were reviewed by me and  considered in my medical decision making (see chart for details).  _________________________________________   FINAL CLINICAL IMPRESSION(S) / ED DIAGNOSES  Final diagnoses:  Hypertension, unspecified type  Acute right-sided low back pain with right-sided sciatica     If controlled substance prescribed during this visit, 12 month history viewed on the [facility] prior to issuing an initial prescription for Schedule II or III opiod.   Chinita Pester, FNP 12/11/19 1421    Gilles Chiquito, MD 12/11/19 1540

## 2019-12-11 NOTE — ED Notes (Signed)
Patient is having a lot of lower back pain. She has high BP, but has not taken her medication today.

## 2019-12-11 NOTE — ED Triage Notes (Signed)
Pt arrives via pov, ambulatory to triage. C/o lower back pain starting Monday, reports pain is now traveling down to right leg. Hx of sciatica. nad noted at this time

## 2019-12-11 NOTE — ED Notes (Signed)
pain to right side  buttocks radiating down her her right leg. Patient with hx of htn, reports did not take her medications last night or this morning.

## 2020-04-17 IMAGING — CR DG CHEST 2V
1 series · 2 of 2 positions shown · non-contrast
Comparison: 09/27/2012

CLINICAL DATA: Chest pain and multiple syncopal episodes over the
past 2 days.

EXAM:
CHEST - 2 VIEW

[Series 1: dg chest 2 view · 0.14mm/px · 2 of 2 slices shown]
[im 1/2]
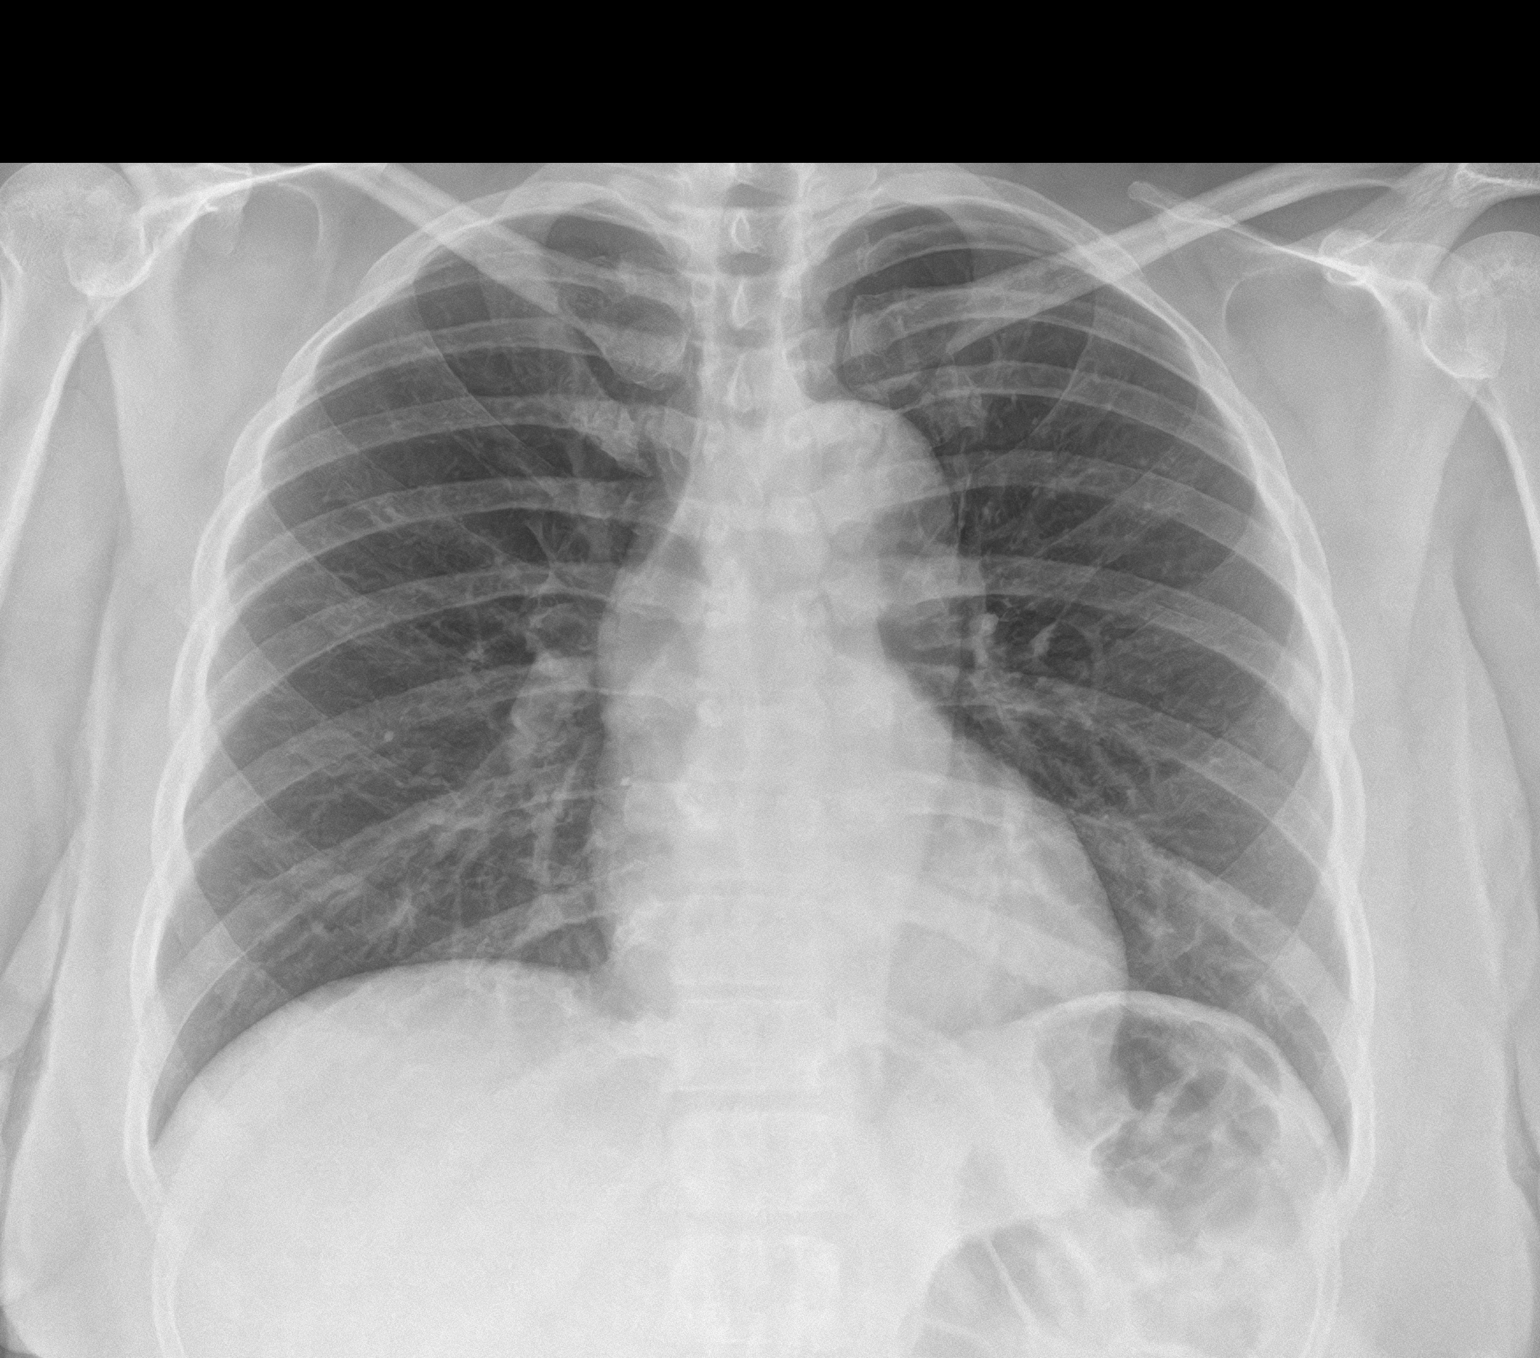
[im 2/2]
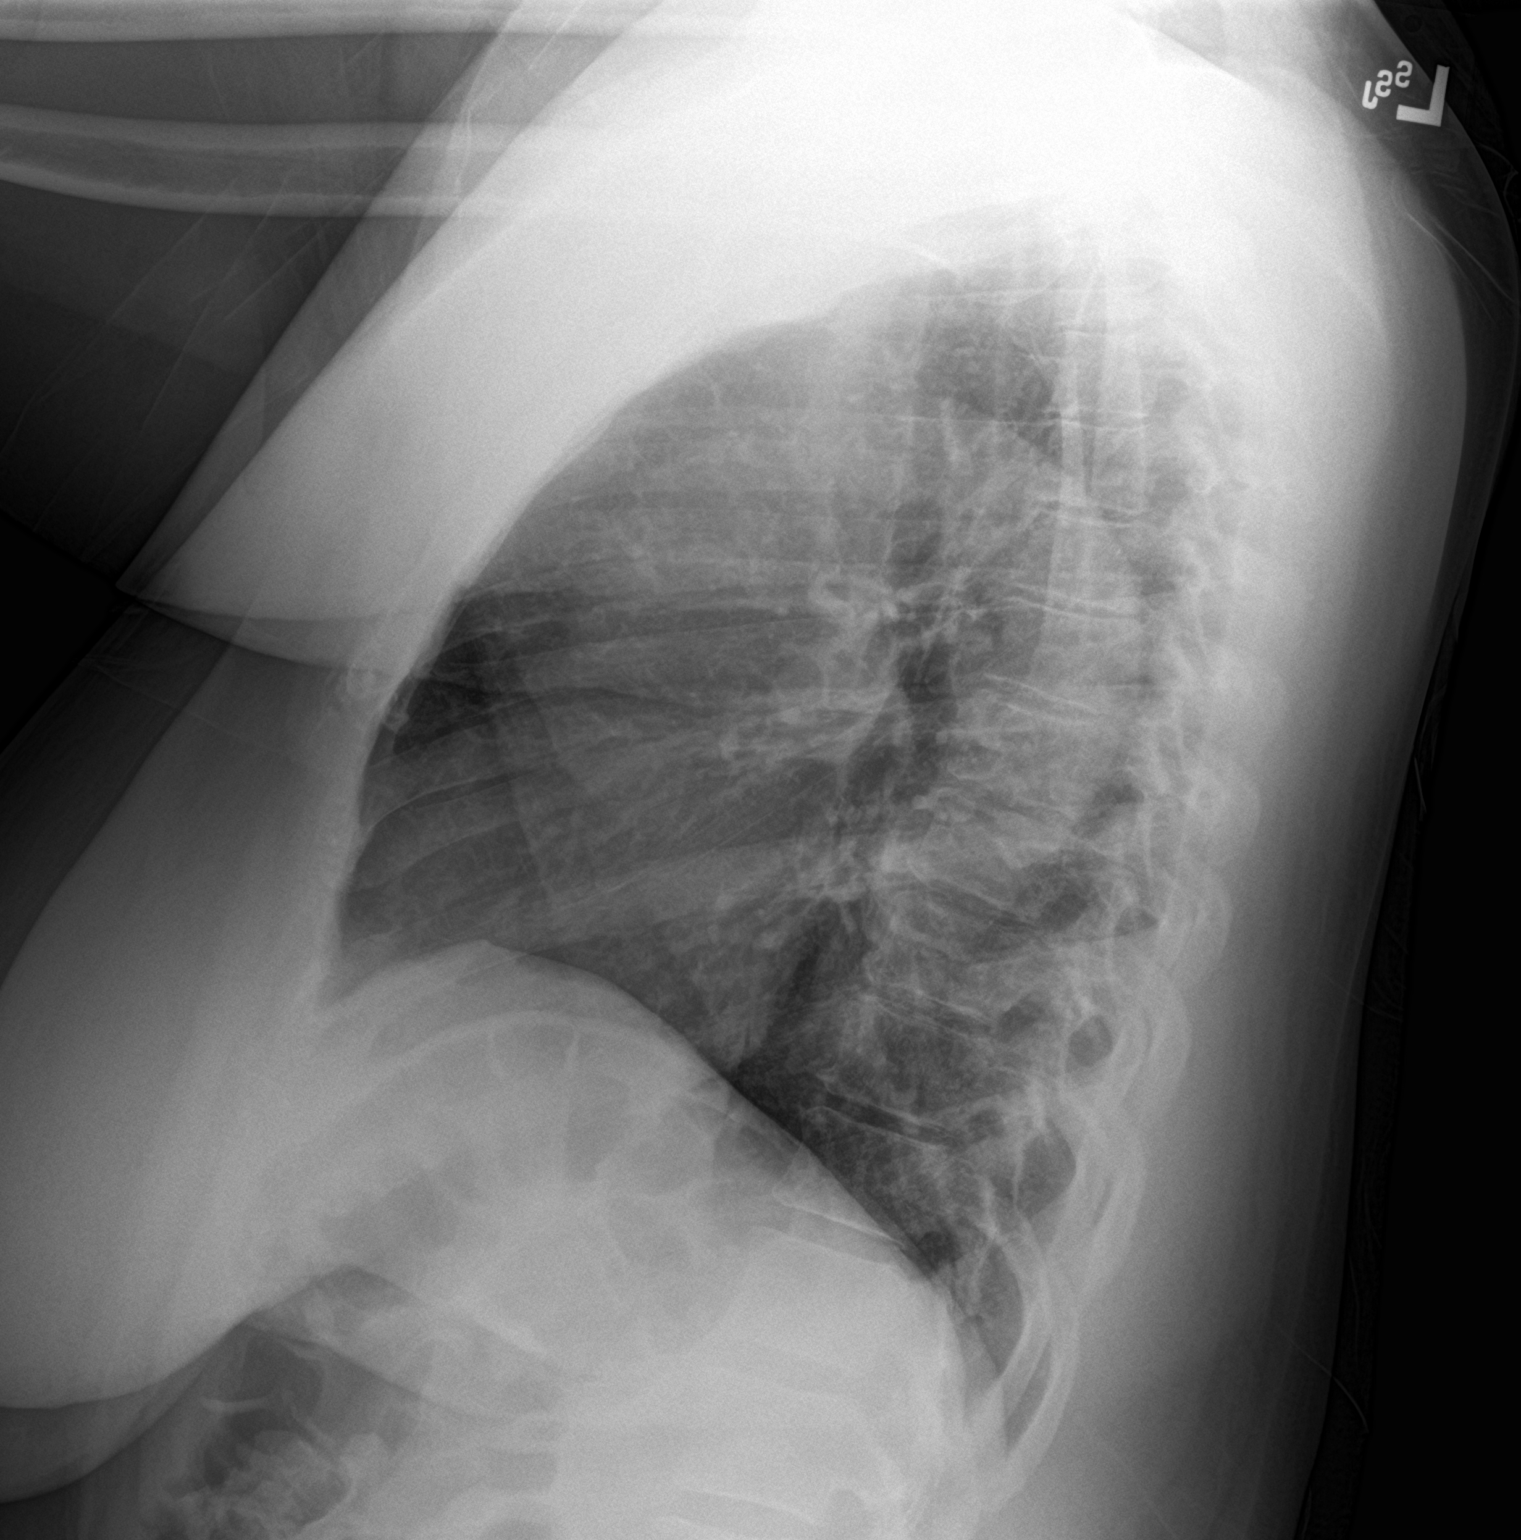

[2 of 2 positions shown; findings below may reference images not displayed]

FINDINGS: The cardiomediastinal silhouette is within normal limits. The lungs
are well inflated and clear. There is no evidence of pleural
effusion or pneumothorax. No acute osseous abnormality is
identified.
IMPRESSION: No active cardiopulmonary disease.

## 2020-06-03 ENCOUNTER — Encounter: Payer: Self-pay | Admitting: Emergency Medicine

## 2020-06-03 ENCOUNTER — Emergency Department: Payer: PRIVATE HEALTH INSURANCE

## 2020-06-03 ENCOUNTER — Other Ambulatory Visit: Payer: Self-pay

## 2020-06-03 ENCOUNTER — Emergency Department
Admission: EM | Admit: 2020-06-03 | Discharge: 2020-06-03 | Disposition: A | Discharge status: Home / Self Care | Payer: PRIVATE HEALTH INSURANCE | Attending: Emergency Medicine | Admitting: Emergency Medicine

## 2020-06-03 DIAGNOSIS — R079 Chest pain, unspecified: Secondary | ICD-10-CM | POA: Insufficient documentation

## 2020-06-03 DIAGNOSIS — Z87891 Personal history of nicotine dependence: Secondary | ICD-10-CM | POA: Insufficient documentation

## 2020-06-03 DIAGNOSIS — I10 Essential (primary) hypertension: Secondary | ICD-10-CM | POA: Insufficient documentation

## 2020-06-03 DIAGNOSIS — Z79899 Other long term (current) drug therapy: Secondary | ICD-10-CM | POA: Insufficient documentation

## 2020-06-03 DIAGNOSIS — M25511 Pain in right shoulder: Secondary | ICD-10-CM | POA: Insufficient documentation

## 2020-06-03 DIAGNOSIS — R1011 Right upper quadrant pain: Secondary | ICD-10-CM

## 2020-06-03 LAB — BASIC METABOLIC PANEL
Anion gap: 8 (ref 5–15)
BUN: 14 mg/dL (ref 6–20)
CO2: 27 mmol/L (ref 22–32)
Calcium: 8.8 mg/dL — ABNORMAL LOW (ref 8.9–10.3)
Chloride: 105 mmol/L (ref 98–111)
Creatinine, Ser: 1.07 mg/dL — ABNORMAL HIGH (ref 0.44–1.00)
GFR, Estimated: >60 mL/min (ref >60)
Glucose, Bld: 113 mg/dL — ABNORMAL HIGH (ref 70–99)
Potassium: 3.8 mmol/L (ref 3.5–5.1)
Sodium: 140 mmol/L (ref 135–145)

## 2020-06-03 LAB — CBC
HCT: 43.1 % (ref 36.0–46.0)
Hemoglobin: 14.1 g/dL (ref 12.0–15.0)
MCH: 29.4 pg (ref 26.0–34.0)
MCHC: 32.7 g/dL (ref 30.0–36.0)
MCV: 89.8 fL (ref 80.0–100.0)
Platelets: 228 10*3/uL (ref 150–400)
RBC: 4.8 MIL/uL (ref 3.87–5.11)
RDW: 14.6 % (ref 11.5–15.5)
WBC: 10.2 10*3/uL (ref 4.0–10.5)
nRBC: 0 % (ref 0.0–0.2)

## 2020-06-03 LAB — LIPASE, BLOOD: Lipase: 25 U/L (ref 11–51)

## 2020-06-03 LAB — POC URINE PREG, ED: Preg Test, Ur: NEGATIVE

## 2020-06-03 LAB — TROPONIN I (HIGH SENSITIVITY): Troponin I (High Sensitivity): 7 ng/L (ref <18)

## 2020-06-03 MED ORDER — IOHEXOL 350 MG/ML SOLN
75.0000 mL | Freq: Once | INTRAVENOUS | Status: AC | PRN
  Administered 2020-06-03: 75 mL via INTRAVENOUS
  Filled 2020-06-03: qty 75

## 2020-06-03 MED ORDER — OXYCODONE-ACETAMINOPHEN 5-325 MG PO TABS
1.0000 | ORAL_TABLET | Freq: Once | ORAL | Status: AC
  Administered 2020-06-03: 1 via ORAL
  Filled 2020-06-03: qty 1

## 2020-06-03 MED ORDER — CLONIDINE HCL 0.1 MG PO TABS
0.1000 mg | ORAL_TABLET | Freq: Once | ORAL | Status: AC
  Administered 2020-06-03: 0.1 mg via ORAL
  Filled 2020-06-03: qty 1

## 2020-06-03 MED ORDER — LISINOPRIL 10 MG PO TABS
10.0000 mg | ORAL_TABLET | Freq: Once | ORAL | Status: AC
  Administered 2020-06-03: 10 mg via ORAL
  Filled 2020-06-03: qty 1

## 2020-06-03 MED ORDER — FENTANYL CITRATE (PF) 100 MCG/2ML IJ SOLN
75.0000 ug | Freq: Once | INTRAMUSCULAR | Status: AC
  Administered 2020-06-03: 75 ug via INTRAVENOUS
  Filled 2020-06-03: qty 2

## 2020-06-03 MED ORDER — KETOROLAC TROMETHAMINE 30 MG/ML IJ SOLN
30.0000 mg | Freq: Once | INTRAMUSCULAR | Status: AC
  Administered 2020-06-03: 30 mg via INTRAVENOUS
  Filled 2020-06-03: qty 1

## 2020-06-03 MED ORDER — METOPROLOL TARTRATE 25 MG PO TABS
25.0000 mg | ORAL_TABLET | Freq: Once | ORAL | Status: AC
  Administered 2020-06-03: 25 mg via ORAL
  Filled 2020-06-03: qty 1

## 2020-06-03 MED ORDER — HYDROCODONE-ACETAMINOPHEN 5-325 MG PO TABS: 1.0000 | ORAL_TABLET | ORAL | 0 refills | Status: DC | PRN

## 2020-06-03 NOTE — ED Provider Notes (Signed)
-----------------------------------------   4:10 PM on 06/03/2020 -----------------------------------------  I have personally seen the patient as well.  Patient appears well and states she is ready to go home, states she has been here all day.  Patient's work-up is essentially negative for acute abnormality including negative cardiac enzymes reassuring lipase, right upper quadrant ultrasound and a CTA of the chest.  Discussed with the patient she needs to start taking her blood pressure medications each day as prescribed by her doctor.  Patient states she has not yet taken her medications today and will take them when she gets home.  We will write the patient a work note for the next 2 days to allow her to get in with her primary care doctor.  I will prescribe her very short course of Norco.  Discussed return precautions.  Patient agreeable to plan of care.   Minna Antis, MD 06/03/20 [phone]

## 2020-06-03 NOTE — ED Notes (Signed)
Medication administered as ordered, pt tolerated well. Pt resting in bed watching TV at this time.

## 2020-06-03 NOTE — ED Notes (Signed)
Pt c/o worsening R sided CP, states feels like it is moving towards center of her chest. PA [name] made aware of patient's complaints. Orders placed for [place], pain medication, and add on blood work.

## 2020-06-03 NOTE — ED Notes (Signed)
Lab called to draw labs due to patient being difficult stick

## 2020-06-03 NOTE — ED Notes (Signed)
Lab at bedside at this time to draw labs.

## 2020-06-03 NOTE — ED Notes (Signed)
Pt c/o R shoulder pain that started to posterior R scapula last night, pt states pain radiates to R chest at this time. Pt with noted intermittent tachypnea at this time. This RN attempted x 2 for IV access without success.

## 2020-06-03 NOTE — ED Triage Notes (Signed)
Patient to ER for c/o right shoulder pain since yesterday. Denies any known injury. Unrelieved by IBU per patient.

## 2020-06-03 NOTE — ED Notes (Signed)
Up to bathroom ambulatory.  When she came back she is asking why she is still here.  Says her son has to go to work at 5 and that is her only ride.

## 2020-06-03 NOTE — ED Provider Notes (Signed)
[facility] Emergency Department Provider Note   ____________________________________________   Event Date/Time   First MD Initiated Contact with Patient 06/03/20 [phone]     (approximate)  I have reviewed the triage vital signs and the nursing notes.   HISTORY  Chief Complaint Shoulder Pain   HPI Kristy Kirby is a 45 y.o. female presents to the ED with complaint of right shoulder pain posteriorly that started last evening.  She reports that later she began having pain in the right anterior shoulder area with increased pain with range of motion.  Patient denies any injury to her shoulder.  She states she took an anti-inflammatory last night without any relief.  Denies any URI symptoms or GI symptoms.  Patient does have a history of hypertension but states she did not take her blood pressure medication today.  She denies any problems with controlling her blood pressure in the past.  Her PCP is Dr. Elizabeth Sauer.         Past Medical History:  Diagnosis Date  . Anxiety   . Arthritis    Rheumatoid  . Depression   . HTN (hypertension)   . Sciatica   . Seizures [facility])     Patient Active Problem List   Diagnosis Date Noted  . History of depression 08/30/2019  . Neuropathy 08/30/2019  . Seizure (HCC) 08/30/2019  . Anxiety associated with depression 01/03/2012  . Hypertension 01/03/2012  . Insomnia 01/03/2012  . Seizure disorder, grand mal (HCC) 01/03/2012  . Uterine adnexal pain 01/03/2012    Past Surgical History:  Procedure Laterality Date  . TUBAL LIGATION      Prior to Admission medications   Medication Sig Start Date End Date Taking? Authorizing Provider  HYDROcodone-acetaminophen (NORCO/VICODIN) 5-325 MG tablet Take 1 tablet by mouth every 4 (four) hours as needed for moderate pain. 06/03/20 06/03/21 Yes Minna Antis, MD  albuterol (VENTOLIN HFA) 108 (90 Base) MCG/ACT inhaler Inhale 1-2 puffs into the lungs every 6 (six) hours as  needed for wheezing or shortness of breath. 09/13/19   [name], MD  cyclobenzaprine (FLEXERIL) 10 MG tablet Take 1 tablet (10 mg total) by mouth 3 (three) times daily as needed for muscle spasms. 12/11/19   Triplett, Rulon Eisenmenger B, FNP  FLUoxetine (PROZAC) 10 MG capsule Take 1 capsule by mouth once daily 11/21/19   [name], MD  hydrochlorothiazide (HYDRODIURIL) 25 MG tablet Take 1 tablet (25 mg total) by mouth daily. 09/13/19   [name], MD  lamoTRIgine (LAMICTAL) 25 MG tablet Wk1:1tab night wk2:1tab 2xday wk3:1tab AM 2tabsPM wk4:2tabs 2xday wk5:2tabs AM 3tabs PM wk6:3tabs 2xday wk7:3tabs AM 4tabs PM, wk8:4tab2xday 08/27/19   [provider]  levETIRAcetam (KEPPRA) 500 MG tablet Take 1 tablet (500 mg total) by mouth 2 (two) times daily. 08/17/19 09/16/19  [name], MD  lisinopril (ZESTRIL) 10 MG tablet Take by mouth. 04/18/17   [provider]  losartan (COZAAR) 50 MG tablet Take 1 tablet (50 mg total) by mouth daily. 09/13/19   [name], MD  meloxicam (MOBIC) 15 MG tablet Take 1 tablet (15 mg total) by mouth daily. 12/11/19   Triplett, Rulon Eisenmenger B, FNP  methocarbamol (ROBAXIN) 750 MG tablet Take 1 tablet (750 mg total) by mouth 2 (two) times daily as needed for muscle spasms. 10/06/18   Verlee Monte, NP  metoprolol tartrate (LOPRESSOR) 25 MG tablet Take 1 tablet (25 mg total) by mouth 2 (two) times daily. 09/13/19  Duanne LimerickJones, Deanna C, MD  phenytoin (DILANTIN) 100 MG ER capsule Take 3 capsules (300 mg total) by mouth 2 (two) times daily. 06/29/19 10/27/19  Emily FilbertWilliams, Jonathan E, MD  potassium chloride SA (KLOR-CON M20) 20 MEQ tablet Take 1 tablet (20 mEq total) by mouth daily. 08/17/19   Duanne LimerickJones, Deanna C, MD    Allergies Banana, Coconut fatty acids, Penicillins, Sulfa antibiotics, and Coconut oil  Family History  Problem Relation Age of Onset  . Hypertension Mother   . Diabetes Mother   . Stroke Mother   . Hypertension Father   . Diabetes Father   . Heart disease  Father   . Stroke Father   . Diabetes Sister   . Stroke Paternal Grandmother   . Stroke Paternal Grandfather     Social History Social History   Tobacco Use  . Smoking status: Former Smoker    Packs/day: 0.50    Types: Cigarettes  . Smokeless tobacco: Never Used  Vaping Use  . Vaping Use: Every day  Substance Use Topics  . Alcohol use: No  . Drug use: No    Review of Systems Constitutional: No fever/chills Eyes: No visual changes. ENT: No sore throat. Cardiovascular: Denies chest pain. Respiratory: Denies shortness of breath.  Negative for cough. Gastrointestinal: No abdominal pain.  No nausea, no vomiting.  No diarrhea.   Genitourinary: Negative for dysuria. Musculoskeletal: Positive for right shoulder, right anterior chest wall pain. Skin: Negative for rash. Neurological: Negative for headaches, focal weakness or numbness. ____________________________________________   PHYSICAL EXAM:  VITAL SIGNS: ED Triage Vitals  Enc Vitals Group     BP 06/03/20 0821 (!) 180/142     Pulse Rate 06/03/20 0821 92     Resp 06/03/20 0821 20     Temp 06/03/20 0821 98.3 F (36.8 C)     Temp Source 06/03/20 0821 Oral     SpO2 06/03/20 0821 100 %     Weight 06/03/20 0816 240 lb (108.9 kg)     Height 06/03/20 0816 5\' 3"  (1.6 m)     Head Circumference --      Peak Flow --      Pain Score 06/03/20 0821 10     Pain Loc --      Pain Edu? --      Excl. in GC? --     Constitutional: Alert and oriented. Well appearing and in no acute distress. Eyes: Conjunctivae are normal.  Head: Atraumatic. Neck: No stridor.   Cardiovascular: Normal rate, regular rhythm. Grossly normal heart sounds.  Good peripheral circulation. Respiratory: Normal respiratory effort.  No retractions. Lungs CTAB. Gastrointestinal: Soft and nontender. No distention.  Bowel sounds normoactive x4 quadrants.  No epigastric or right upper quadrant tenderness on palpation. Musculoskeletal: Examination of the right  shoulder there is no gross deformity however with range of motion this reproduces her pain and range of motion is limited secondary to pain.  No crepitus is appreciated.  Pulses present distally.  Motor sensory function intact.  No edema noted in comparison with the left.  No point tenderness on palpation.  Patient initially was tender generalized scapular area and on second exam had tenderness on the anterior portion of the right chest wall.  Noted bruising or abrasions are noted in this area. Neurologic:  Normal speech and language. No gross focal neurologic deficits are appreciated. No gait instability. Skin:  Skin is warm, dry and intact. Psychiatric: Mood and affect are normal. Speech and behavior are normal.  ____________________________________________  LABS (all labs ordered are listed, but only abnormal results are displayed)  Labs Reviewed  BASIC METABOLIC PANEL - Abnormal; Notable for the following components:      Result Value   Glucose, Bld 113 (*)    Creatinine, Ser 1.07 (*)    Calcium 8.8 (*)    All other components within normal limits  CBC  LIPASE, BLOOD  POC URINE PREG, ED  TROPONIN I (HIGH SENSITIVITY)   ____________________________________________  EKG   ____________________________________________  RADIOLOGY Beaulah Corin, personally viewed and evaluated these images (plain radiographs) as part of my medical decision making, as well as reviewing the written report by the radiologist.  Official radiology report(s): DG Chest 2 View  Result Date: 06/03/2020 CLINICAL DATA:  Chest pain. EXAM: CHEST - 2 VIEW COMPARISON:  04/19/2018 FINDINGS: The heart size and mediastinal contours are within normal limits. Both lungs are clear. The visualized skeletal structures are unremarkable. IMPRESSION: No active cardiopulmonary disease. Electronically Signed   By: Signa Kell M.D.   On: 06/03/2020 09:21   CT Angio Chest PE W and/or Wo Contrast  Result Date:  06/03/2020 CLINICAL DATA:  Chest pain. EXAM: CT ANGIOGRAPHY CHEST WITH CONTRAST TECHNIQUE: Multidetector CT imaging of the chest was performed using the standard protocol during bolus administration of intravenous contrast. Multiplanar CT image reconstructions and MIPs were obtained to evaluate the vascular anatomy. CONTRAST:  86mL OMNIPAQUE IOHEXOL 350 MG/ML SOLN COMPARISON:  April 18, 2008. FINDINGS: Cardiovascular: Satisfactory opacification of the pulmonary arteries to the segmental level. No evidence of pulmonary embolism. Normal heart size. No pericardial effusion. Mediastinum/Nodes: No enlarged mediastinal, hilar, or axillary lymph nodes. Thyroid gland, trachea, and esophagus demonstrate no significant findings. Lungs/Pleura: Lungs are clear. No pleural effusion or pneumothorax. Upper Abdomen: No acute abnormality. Musculoskeletal: No chest wall abnormality. No acute or significant osseous findings. Review of the MIP images confirms the above findings. IMPRESSION: No definite evidence of pulmonary embolus. No definite abnormality seen in the chest. Electronically Signed   By: Lupita Raider M.D.   On: 06/03/2020 15:17   US Abdomen Limited RUQ (LIVER/GB)  Result Date: 06/03/2020 CLINICAL DATA:  Right upper quadrant pain and right shoulder pain x1 day EXAM: ULTRASOUND ABDOMEN LIMITED RIGHT UPPER QUADRANT COMPARISON:  None. FINDINGS: Gallbladder: No gallstones or wall thickening visualized. No sonographic Murphy sign noted by sonographer. Common bile duct: Diameter: 3 mm Liver: No focal lesion identified. Heterogeneously increased parenchymal echogenicity. Portal vein is patent on color Doppler imaging with normal direction of blood flow towards the liver. Other: None. IMPRESSION: 1. No evidence of cholelithiasis or acute cholecystitis. 2. Heterogeneously increased parenchymal echogenicity of the liver. This is a nonspecific finding but is most commonly seen with fatty infiltration of the liver. There are no  overt focal liver lesions. Electronically Signed   By: Maudry Mayhew MD   On: 06/03/2020 10:54    ____________________________________________   PROCEDURES  Procedure(s) performed (including Critical Care):  Procedures   ____________________________________________   INITIAL IMPRESSION / ASSESSMENT AND PLAN / ED COURSE  As part of my medical decision making, I reviewed the following data within the electronic MEDICAL RECORD NUMBER Notes from prior ED visits and [place] Controlled Substance Database  45 year old female presents to the ED with complaint of right shoulder posterior and anterior pain that began last evening.  Patient denies any injury.  She also reports that there has been no URI or GI symptoms along with this.  Patient states that she took an anti-inflammatory  last night without any relief.  She does have a history of hypertension and this morning did not take her blood pressure medication.  Her initial blood pressure in triage was 180/142.  Patient denied any headache and was unaware that her blood pressure was elevated.  She currently takes lisinopril 10 mg, metoprolol 25 mg and hydrochlorothiazide 25 mg for her blood pressure.  Imaging and lab work was discussed with patient which was reassuring however her blood pressure continued to be elevated.  Patient was noted to be eating food prior to her CT scan of her chest to rule out a PE.  She was given Catapres 0.1 mg to help with this.  Patient is encouraged to follow-up with her PCP for her blood pressure.  Dr. Ovidio Kin was in to see patient prior to discharge and will discharge patient.      ____________________________________________   FINAL CLINICAL IMPRESSION(S) / ED DIAGNOSES  Final diagnoses:  Acute pain of right shoulder  Chest pain, unspecified type     ED Discharge Orders         Ordered    HYDROcodone-acetaminophen (NORCO/VICODIN) 5-325 MG tablet  Every 4 hours PRN        06/03/20 1607          *Please  note:  ARIEANNA PRESSEY was evaluated in Emergency Department on 06/03/2020 for the symptoms described in the history of present illness. She was evaluated in the context of the global COVID-19 pandemic, which necessitated consideration that the patient might be at risk for infection with the SARS-CoV-2 virus that causes COVID-19. Institutional protocols and algorithms that pertain to the evaluation of patients at risk for COVID-19 are in a state of rapid change based on information released by regulatory bodies including the CDC and federal and state organizations. These policies and algorithms were followed during the patient's care in the ED.  Some ED evaluations and interventions may be delayed as a result of limited staffing during and the pandemic.*   Note:  This document was prepared using Dragon voice recognition software and may include unintentional dictation errors.    Tommi Rumps, PA-C 06/03/20 1613    Minna Antis, MD 06/03/20 [phone]

## 2020-07-22 ENCOUNTER — Encounter: Payer: Self-pay | Admitting: Advanced Practice Midwife

## 2020-07-22 ENCOUNTER — Ambulatory Visit (INDEPENDENT_AMBULATORY_CARE_PROVIDER_SITE_OTHER): Payer: BC Managed Care – PPO | Admitting: Advanced Practice Midwife

## 2020-07-22 ENCOUNTER — Other Ambulatory Visit: Payer: Self-pay

## 2020-07-22 ENCOUNTER — Other Ambulatory Visit (HOSPITAL_COMMUNITY)
Admission: RE | Admit: 2020-07-22 | Discharge: 2020-07-22 | Disposition: A | Discharge status: Home / Self Care | Payer: Self-pay | Source: Ambulatory Visit | Attending: Advanced Practice Midwife | Admitting: Advanced Practice Midwife

## 2020-07-22 ENCOUNTER — Ambulatory Visit: Payer: Self-pay | Admitting: Advanced Practice Midwife

## 2020-07-22 VITALS — BP 160/100 | Ht 63.0 in | Wt 280.0 lb

## 2020-07-22 DIAGNOSIS — Z113 Encounter for screening for infections with a predominantly sexual mode of transmission: Secondary | ICD-10-CM | POA: Insufficient documentation

## 2020-07-22 DIAGNOSIS — N939 Abnormal uterine and vaginal bleeding, unspecified: Secondary | ICD-10-CM

## 2020-07-22 DIAGNOSIS — N930 Postcoital and contact bleeding: Secondary | ICD-10-CM | POA: Diagnosis not present

## 2020-07-24 ENCOUNTER — Encounter: Payer: Self-pay | Admitting: Advanced Practice Midwife

## 2020-07-24 LAB — CERVICOVAGINAL ANCILLARY ONLY
Chlamydia: NEGATIVE
Comment: NEGATIVE
Comment: NEGATIVE
Comment: NORMAL
Neisseria Gonorrhea: NEGATIVE
Trichomonas: NEGATIVE

## 2020-07-24 NOTE — Progress Notes (Signed)
Patient ID: Kristy Kirby, female   DOB: 31-May-1975, 45 y.o.   MRN: [id number]  Reason for Consult: bleeding with sex    Subjective:  Date of Service: 07/24/2020  HPI:  Kristy Kirby is a 45 y.o. female being seen for 2 episodes of spotting after intercourse. This happened both 2 weeks ago and this morning. The spotting occurred with wiping and she did not wear a pad. She denies any vaginal itching, irritation, discharge or odor. The bleeding concerns her since she has not had any periods for the past 2 years. We discussed that she has probably gone through early menopause and that spotting with intercourse can happen due to friable nature of cervix. We also discussed concerning symptoms such as heavy bleeding after no periods for 1 year. She requests STD testing. She had a normal PAP smear 1 year ago with the exception of positive trichomonas.   She is interested in hormone level check, however, declines blood work today.  Past Medical History:  Diagnosis Date   Anxiety    Arthritis    Rheumatoid   Depression    HTN (hypertension)    Sciatica    Seizures (HCC)    Family History  Problem Relation Age of Onset   Hypertension Mother    Diabetes Mother    Stroke Mother    Hypertension Father    Diabetes Father    Heart disease Father    Stroke Father    Diabetes Sister    Stroke Paternal Grandmother    Stroke Paternal Grandfather    Past Surgical History:  Procedure Laterality Date   TUBAL LIGATION      Short Social History:  Social History   Tobacco Use   Smoking status: Former    Packs/day: 0.50    Pack years: 0.00    Types: Cigarettes   Smokeless tobacco: Never  Substance Use Topics   Alcohol use: No    Allergies  Allergen Reactions   Banana    Coconut Fatty Acids    Penicillins    Sulfa Antibiotics Hives    GI upset    Coconut Oil Rash    Current Outpatient Medications  Medication Sig Dispense Refill   albuterol (VENTOLIN HFA) 108 (90 Base)  MCG/ACT inhaler Inhale 1-2 puffs into the lungs every 6 (six) hours as needed for wheezing or shortness of breath. 6.7 g 11   cyclobenzaprine (FLEXERIL) 10 MG tablet Take 1 tablet (10 mg total) by mouth 3 (three) times daily as needed for muscle spasms. 30 tablet 0   FLUoxetine (PROZAC) 10 MG capsule Take 1 capsule by mouth once daily 30 capsule 0   hydrochlorothiazide (HYDRODIURIL) 25 MG tablet Take 1 tablet (25 mg total) by mouth daily. 30 tablet 2   HYDROcodone-acetaminophen (NORCO/VICODIN) 5-325 MG tablet Take 1 tablet by mouth every 4 (four) hours as needed for moderate pain. 10 tablet 0   lisinopril (ZESTRIL) 10 MG tablet Take by mouth.     losartan (COZAAR) 50 MG tablet Take 1 tablet (50 mg total) by mouth daily. 60 tablet 0   meloxicam (MOBIC) 15 MG tablet Take 1 tablet (15 mg total) by mouth daily. 30 tablet 0   methocarbamol (ROBAXIN) 750 MG tablet Take 1 tablet (750 mg total) by mouth 2 (two) times daily as needed for muscle spasms. 10 tablet 0   metoprolol tartrate (LOPRESSOR) 25 MG tablet Take 1 tablet (25 mg total) by mouth 2 (two) times daily. 60 tablet 1  potassium chloride SA (KLOR-CON M20) 20 MEQ tablet Take 1 tablet (20 mEq total) by mouth daily. 90 tablet 1   lamoTRIgine (LAMICTAL) 25 MG tablet Wk1:1tab night wk2:1tab 2xday wk3:1tab AM 2tabsPM wk4:2tabs 2xday wk5:2tabs AM 3tabs PM wk6:3tabs 2xday wk7:3tabs AM 4tabs PM, wk8:4tab2xday (Patient not taking: Reported on 07/22/2020)     levETIRAcetam (KEPPRA) 500 MG tablet Take 1 tablet (500 mg total) by mouth 2 (two) times daily. 60 tablet 1   phenytoin (DILANTIN) 100 MG ER capsule Take 3 capsules (300 mg total) by mouth 2 (two) times daily. 180 capsule 3   No current facility-administered medications for this visit.   Review of Systems  Constitutional:  Negative for chills and fever.  HENT:  Negative for congestion, ear discharge, ear pain, hearing loss, sinus pain and sore throat.   Eyes:  Negative for blurred vision and double  vision.  Respiratory:  Negative for cough, shortness of breath and wheezing.   Cardiovascular:  Negative for chest pain, palpitations and leg swelling.  Gastrointestinal:  Negative for abdominal pain, blood in stool, constipation, diarrhea, heartburn, melena, nausea and vomiting.  Genitourinary:  Negative for dysuria, flank pain, frequency, hematuria and urgency.       Positive for spotting after intercourse  Musculoskeletal:  Negative for back pain, joint pain and myalgias.  Skin:  Negative for itching and rash.  Neurological:  Negative for dizziness, tingling, tremors, sensory change, speech change, focal weakness, seizures, loss of consciousness, weakness and headaches.  Endo/Heme/Allergies:  Negative for environmental allergies. Does not bruise/bleed easily.  Psychiatric/Behavioral:  Negative for depression, hallucinations, memory loss, substance abuse and suicidal ideas. The patient is not nervous/anxious and does not have insomnia.        Objective:  Objective   Vitals:   07/22/20 1327  BP: (!) 160/100  Weight: 280 lb (127 kg)  Height: 5\' 3"  (1.6 m)   Body mass index is 49.6 kg/m. Constitutional: Well nourished, well developed female in no acute distress.  HEENT: normal Skin: Warm and dry.  Cardiovascular: Regular rate and rhythm.   Respiratory: Clear to auscultation bilateral. Normal respiratory effort Neuro: DTRs 2+, Cranial nerves grossly intact Psych: Alert and Oriented x3. No memory deficits. Normal mood and affect.  MS: normal gait, normal bilateral lower extremity ROM/strength/stability.  Pelvic exam:  is limited by body habitus EGBUS: within normal limits Vagina: within normal limits and with normal mucosa, no blood in the vault Cervix: no active bleeding  Time spent in care and management of this patient: 19 minutes Assessment/Plan:     45 y.o. G4 P62 female with spotting after intercourse, STD testing  Aptima swab Follow up as needed and worsening  symptoms   P45 CNM [facility] 07/24/2020, 12:02 PM

## 2020-09-06 ENCOUNTER — Emergency Department
Admission: EM | Admit: 2020-09-06 | Discharge: 2020-09-06 | Disposition: A | Discharge status: Home / Self Care | Payer: BC Managed Care – PPO | Attending: Emergency Medicine | Admitting: Emergency Medicine

## 2020-09-06 ENCOUNTER — Emergency Department: Payer: BC Managed Care – PPO

## 2020-09-06 ENCOUNTER — Other Ambulatory Visit: Payer: Self-pay

## 2020-09-06 DIAGNOSIS — Z79899 Other long term (current) drug therapy: Secondary | ICD-10-CM | POA: Insufficient documentation

## 2020-09-06 DIAGNOSIS — I10 Essential (primary) hypertension: Secondary | ICD-10-CM | POA: Diagnosis not present

## 2020-09-06 DIAGNOSIS — Y9389 Activity, other specified: Secondary | ICD-10-CM | POA: Insufficient documentation

## 2020-09-06 DIAGNOSIS — M25552 Pain in left hip: Secondary | ICD-10-CM | POA: Diagnosis present

## 2020-09-06 DIAGNOSIS — M7062 Trochanteric bursitis, left hip: Secondary | ICD-10-CM | POA: Diagnosis not present

## 2020-09-06 DIAGNOSIS — Z87891 Personal history of nicotine dependence: Secondary | ICD-10-CM | POA: Insufficient documentation

## 2020-09-06 MED ORDER — CYCLOBENZAPRINE HCL 10 MG PO TABS: 10.0000 mg | ORAL_TABLET | Freq: Three times a day (TID) | ORAL | 0 refills | Status: DC | PRN

## 2020-09-06 MED ORDER — METHYLPREDNISOLONE 4 MG PO TBPK: ORAL_TABLET | ORAL | 0 refills | Status: DC

## 2020-09-06 MED ORDER — CYCLOBENZAPRINE HCL 10 MG PO TABS
10.0000 mg | ORAL_TABLET | Freq: Once | ORAL | Status: AC
  Administered 2020-09-06: 10 mg via ORAL
  Filled 2020-09-06: qty 1

## 2020-09-06 MED ORDER — METHYLPREDNISOLONE SODIUM SUCC 125 MG IJ SOLR
125.0000 mg | Freq: Once | INTRAMUSCULAR | Status: AC
  Administered 2020-09-06: 125 mg via INTRAVENOUS
  Filled 2020-09-06: qty 2

## 2020-09-06 MED ORDER — ONDANSETRON HCL 4 MG/2ML IJ SOLN
4.0000 mg | Freq: Once | INTRAMUSCULAR | Status: AC
  Administered 2020-09-06: 4 mg via INTRAVENOUS
  Filled 2020-09-06: qty 2

## 2020-09-06 MED ORDER — HYDROMORPHONE HCL 1 MG/ML IJ SOLN
1.0000 mg | Freq: Once | INTRAMUSCULAR | Status: AC
  Administered 2020-09-06: 1 mg via INTRAVENOUS
  Filled 2020-09-06: qty 1

## 2020-09-06 NOTE — ED Triage Notes (Signed)
Pt reports that her left leg began hurting last night, no known injury. She reports that the pain is so bad that she can not move her leg because of the pain. It hurts constantly

## 2020-09-06 NOTE — ED Provider Notes (Signed)
[facility] Emergency Department Provider Note  ____________________________________________   Event Date/Time   First MD Initiated Contact with Patient 09/06/20 1002     (approximate)  I have reviewed the triage vital signs and the nursing notes.   HISTORY  Chief Complaint No chief complaint on file.    HPI Kristy Kirby is a 45 y.o. female presents emergency department via EMS.  Patient is complaining of severe left hip pain.  States she started having pain a few days ago and today was unable to move it or bear weight.  Had some numbness and tingling extending down the leg.  No known injury.  No falls.  Patient has history of seizure  Past Medical History:  Diagnosis Date   Anxiety    Arthritis    Rheumatoid   Depression    HTN (hypertension)    Sciatica    Seizures (HCC)     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   Hypertension 01/03/2012   Insomnia 01/03/2012   Seizure disorder, grand mal (HCC) 01/03/2012   Uterine adnexal pain 01/03/2012    Past Surgical History:  Procedure Laterality Date   TUBAL LIGATION      Prior to Admission medications   Medication Sig Start Date End Date Taking? Authorizing Provider  cyclobenzaprine (FLEXERIL) 10 MG tablet Take 1 tablet (10 mg total) by mouth 3 (three) times daily as needed. 09/06/20  Yes Demauri Advincula, Roselyn Bering, PA-C  methylPREDNISolone (MEDROL DOSEPAK) 4 MG TBPK tablet Take 6 pills on day one then decrease by 1 pill each day 09/06/20  Yes Domini Vandehei, Roselyn Bering, PA-C  albuterol (VENTOLIN HFA) 108 (90 Base) MCG/ACT inhaler Inhale 1-2 puffs into the lungs every 6 (six) hours as needed for wheezing or shortness of breath. 09/13/19   [name], MD  FLUoxetine (PROZAC) 10 MG capsule Take 1 capsule by mouth once daily 11/21/19   [name], MD   hydrochlorothiazide (HYDRODIURIL) 25 MG tablet Take 1 tablet (25 mg total) by mouth daily. 09/13/19   [name], MD  HYDROcodone-acetaminophen (NORCO/VICODIN) 5-325 MG tablet Take 1 tablet by mouth every 4 (four) hours as needed for moderate pain. 06/03/20 06/03/21  Minna Antis, MD  levETIRAcetam (KEPPRA) 500 MG tablet Take 1 tablet (500 mg total) by mouth 2 (two) times daily. 08/17/19 09/16/19  [name], MD  lisinopril (ZESTRIL) 10 MG tablet Take by mouth. 04/18/17   [provider]  losartan (COZAAR) 50 MG tablet Take 1 tablet (50 mg total) by mouth daily. 09/13/19   [name], MD  metoprolol tartrate (LOPRESSOR) 25 MG tablet Take 1 tablet (25 mg total) by mouth 2 (two) times daily. 09/13/19   [name], MD  phenytoin (DILANTIN) 100 MG ER capsule Take 3 capsules (300 mg total) by mouth 2 (two) times daily. 06/29/19 10/27/19  Emily Filbert, MD  potassium chloride SA (KLOR-CON M20) 20 MEQ tablet Take 1 tablet (20 mEq total) by mouth daily. 08/17/19   [name], MD    Allergies Banana, Coconut fatty acids, Penicillins, Sulfa antibiotics, and Coconut oil  Family History  Problem Relation Age of Onset   Hypertension Mother    Diabetes Mother    Stroke Mother    Hypertension Father    Diabetes Father    Heart disease Father  Stroke Father    Diabetes Sister    Stroke Paternal Grandmother    Stroke Paternal Grandfather     Social History Social History   Tobacco Use   Smoking status: Former    Packs/day: 0.50    Types: Cigarettes   Smokeless tobacco: Never  Vaping Use   Vaping Use: Every day  Substance Use Topics   Alcohol use: No   Drug use: No    Review of Systems  Constitutional: No fever/chills Eyes: No visual changes. ENT: No sore throat. Respiratory: Denies cough Cardiovascular: Denies chest pain Gastrointestinal: Denies abdominal pain Genitourinary: Negative for dysuria. Musculoskeletal: Negative for back pain.  Positive  for left hip pain Skin: Negative for rash. Psychiatric: no mood changes,     ____________________________________________   PHYSICAL EXAM:  VITAL SIGNS: ED Triage Vitals  Enc Vitals Group     BP 09/06/20 0851 (!) 180/115     Pulse Rate 09/06/20 0851 99     Resp 09/06/20 0851 20     Temp 09/06/20 0851 98.7 F (37.1 C)     Temp Source 09/06/20 0851 Oral     SpO2 09/06/20 0851 100 %     Weight 09/06/20 0852 240 lb (108.9 kg)     Height 09/06/20 0852 5\' 3"  (1.6 m)     Head Circumference --      Peak Flow --      Pain Score 09/06/20 0851 10     Pain Loc --      Pain Edu? --      Excl. in GC? --     Constitutional: Alert and oriented. Well appearing and in no acute distress. Eyes: Conjunctivae are normal.  Head: Atraumatic. Nose: No congestion/rhinnorhea. Mouth/Throat: Mucous membranes are moist.   Neck:  supple no lymphadenopathy noted Cardiovascular: Normal rate, regular rhythm. Heart sounds are normal Respiratory: Normal respiratory effort.  No retractions, lungs c t a  Abd: soft nontender bs normal all 4 quad GU: deferred Musculoskeletal: Patient is unable to bear weight on the left leg, unable to move it due to severe amount of pain, neurovascular is intact Neurologic:  Normal speech and language.  Skin:  Skin is warm, dry and intact. No rash noted. Psychiatric: Mood and affect are normal. Speech and behavior are normal.  ____________________________________________   LABS (all labs ordered are listed, but only abnormal results are displayed)  Labs Reviewed - No data to display ____________________________________________   ____________________________________________  RADIOLOGY  X-ray of the left hip, CT of the pelvis  ____________________________________________   PROCEDURES  Procedure(s) performed  Procedures    ____________________________________________   INITIAL IMPRESSION / ASSESSMENT AND PLAN / ED COURSE  Pertinent labs & imaging  results that were available during my care of the patient were reviewed by me and considered in my medical decision making (see chart for details).   Patient's 45 year old female presents with left hip pain.  See HPI.  Physical exam shows patient appears stable although in good amount of pain.  Patient was given pain medication prior to being able to examine the hip.  She had been unable to bear weight.  X-ray of the left hip does not show an acute fracture CT of the pelvis does not show an acute fracture or effusion  The patient had been given Dilaudid 1 mg IV, Zofran 4 mg IV, Solu-Medrol 125 mg IV, Flexeril 10 mg p.o.  On examination of the patient after medication, pain is reproduced with internal rotation of the  left hip, palpation of the trochanteric bursa  I did explain the findings to the patient.  He feels is musculoskeletal.  In the discussion with the patient she is recently had a nephew and a brother to die.  She states her nephew died 2 weeks ago and her brother died recently and she is supposed to go to a funeral today.  Explained to her that grief and depression can exasperate pain.  However I do want her to follow-up with orthopedics if she is not improving in 2 to 3 days.  She is given a prescription for Medrol Dosepak and Flexeril.  Return emergency department if worsening.  Discharged in stable condition.     Kristy Kirby was evaluated in Emergency Department on 09/06/2020 for the symptoms described in the history of present illness. She was evaluated in the context of the global COVID-19 pandemic, which necessitated consideration that the patient might be at risk for infection with the SARS-CoV-2 virus that causes COVID-19. Institutional protocols and algorithms that pertain to the evaluation of patients at risk for COVID-19 are in a state of rapid change based on information released by regulatory bodies including the CDC and federal and state organizations. These policies and  algorithms were followed during the patient's care in the ED.    As part of my medical decision making, I reviewed the following data within the electronic MEDICAL RECORD NUMBER Nursing notes reviewed and incorporated, Old chart reviewed, Radiograph reviewed , Notes from prior ED visits, and [place] Controlled Substance Database  ____________________________________________   FINAL CLINICAL IMPRESSION(S) / ED DIAGNOSES  Final diagnoses:  Trochanteric bursitis, left hip  Acute hip pain, left      NEW MEDICATIONS STARTED DURING THIS VISIT:  New Prescriptions   CYCLOBENZAPRINE (FLEXERIL) 10 MG TABLET    Take 1 tablet (10 mg total) by mouth 3 (three) times daily as needed.   METHYLPREDNISOLONE (MEDROL DOSEPAK) 4 MG TBPK TABLET    Take 6 pills on day one then decrease by 1 pill each day     Note:  This document was prepared using Dragon voice recognition software and may include unintentional dictation errors.    Faythe Ghee, PA-C 09/06/20 1231    Gilles Chiquito, MD 09/06/20 [phone]

## 2020-09-06 NOTE — ED Notes (Signed)
Patient transported to CT 

## 2020-09-06 NOTE — Discharge Instructions (Addendum)
Follow-up with orthopedics if not improving in 3 days.  Return emergency department worsening.  Take medication as prescribed.  Apply ice to the left hip.

## 2020-09-06 NOTE — ED Triage Notes (Signed)
Pt in via EMS from home with c/o left hip pain. Pt has been taking tylenol with no relief. 170/100. Pain is worse to bear weight.

## 2020-10-01 ENCOUNTER — Other Ambulatory Visit: Payer: Self-pay

## 2020-10-01 ENCOUNTER — Emergency Department
Admission: EM | Admit: 2020-10-01 | Discharge: 2020-10-01 | Discharge status: Against Medical Advice | Payer: BC Managed Care – PPO | Attending: Emergency Medicine | Admitting: Emergency Medicine

## 2020-10-01 ENCOUNTER — Emergency Department: Payer: BC Managed Care – PPO

## 2020-10-01 DIAGNOSIS — Z79899 Other long term (current) drug therapy: Secondary | ICD-10-CM | POA: Insufficient documentation

## 2020-10-01 DIAGNOSIS — I10 Essential (primary) hypertension: Secondary | ICD-10-CM | POA: Diagnosis not present

## 2020-10-01 DIAGNOSIS — Z87891 Personal history of nicotine dependence: Secondary | ICD-10-CM | POA: Insufficient documentation

## 2020-10-01 DIAGNOSIS — R000 Tachycardia, unspecified: Secondary | ICD-10-CM | POA: Diagnosis not present

## 2020-10-01 DIAGNOSIS — R030 Elevated blood-pressure reading, without diagnosis of hypertension: Secondary | ICD-10-CM | POA: Diagnosis present

## 2020-10-01 DIAGNOSIS — Z20822 Contact with and (suspected) exposure to covid-19: Secondary | ICD-10-CM | POA: Diagnosis not present

## 2020-10-01 DIAGNOSIS — M79604 Pain in right leg: Secondary | ICD-10-CM | POA: Diagnosis not present

## 2020-10-01 DIAGNOSIS — R252 Cramp and spasm: Secondary | ICD-10-CM

## 2020-10-01 DIAGNOSIS — M79605 Pain in left leg: Secondary | ICD-10-CM | POA: Diagnosis not present

## 2020-10-01 LAB — BASIC METABOLIC PANEL
Anion gap: 11 (ref 5–15)
BUN: 14 mg/dL (ref 6–20)
CO2: 25 mmol/L (ref 22–32)
Calcium: 9.3 mg/dL (ref 8.9–10.3)
Chloride: 102 mmol/L (ref 98–111)
Creatinine, Ser: 1.1 mg/dL — ABNORMAL HIGH (ref 0.44–1.00)
GFR, Estimated: >60 mL/min (ref >60)
Glucose, Bld: 147 mg/dL — ABNORMAL HIGH (ref 70–99)
Potassium: 3.6 mmol/L (ref 3.5–5.1)
Sodium: 138 mmol/L (ref 135–145)

## 2020-10-01 LAB — CBC
HCT: 45.4 % (ref 36.0–46.0)
Hemoglobin: 15.3 g/dL — ABNORMAL HIGH (ref 12.0–15.0)
MCH: 30 pg (ref 26.0–34.0)
MCHC: 33.7 g/dL (ref 30.0–36.0)
MCV: 89 fL (ref 80.0–100.0)
Platelets: 263 10*3/uL (ref 150–400)
RBC: 5.1 MIL/uL (ref 3.87–5.11)
RDW: 15 % (ref 11.5–15.5)
WBC: 15.8 10*3/uL — ABNORMAL HIGH (ref 4.0–10.5)
nRBC: 0 % (ref 0.0–0.2)

## 2020-10-01 LAB — TROPONIN I (HIGH SENSITIVITY)
Troponin I (High Sensitivity): 12 ng/L (ref <18)
Troponin I (High Sensitivity): 14 ng/L (ref <18)

## 2020-10-01 LAB — RESP PANEL BY RT-PCR (FLU A&B, COVID) ARPGX2
Influenza A by PCR: NEGATIVE
Influenza B by PCR: NEGATIVE
SARS Coronavirus 2 by RT PCR: NEGATIVE

## 2020-10-01 LAB — D-DIMER, QUANTITATIVE: D-Dimer, Quant: 0.71 ug/mL-FEU — ABNORMAL HIGH (ref 0.00–0.50)

## 2020-10-01 MED ORDER — POTASSIUM CHLORIDE 20 MEQ PO PACK
40.0000 meq | PACK | Freq: Once | ORAL | Status: AC
  Administered 2020-10-01: 40 meq via ORAL
  Filled 2020-10-01: qty 2

## 2020-10-01 MED ORDER — HYDROCORTISONE NA SUCCINATE PF 250 MG IJ SOLR
200.0000 mg | Freq: Once | INTRAMUSCULAR | Status: AC
  Administered 2020-10-01: 200 mg via INTRAVENOUS
  Filled 2020-10-01: qty 200

## 2020-10-01 MED ORDER — DIPHENHYDRAMINE HCL 25 MG PO CAPS: 50.0000 mg | ORAL_CAPSULE | Freq: Once | ORAL | Status: DC

## 2020-10-01 MED ORDER — DIPHENHYDRAMINE HCL 50 MG/ML IJ SOLN: 50.0000 mg | Freq: Once | INTRAMUSCULAR | Status: DC

## 2020-10-01 MED ORDER — LABETALOL HCL 5 MG/ML IV SOLN
10.0000 mg | Freq: Once | INTRAVENOUS | Status: AC
  Administered 2020-10-01: 10 mg via INTRAVENOUS
  Filled 2020-10-01: qty 4

## 2020-10-01 NOTE — ED Triage Notes (Signed)
Pt to ED from [facility] for hypertension. States initially went to [facility] for cramping in both legs. Denies cp, shob.  States leg usually cramp because of potassium, has been taking supplements Ambulatory, NAD noted

## 2020-10-01 NOTE — ED Notes (Signed)
Pt states she wants her iv removed, she is leaving.  Iv removed and pt left.  md aware.  ER busy and pt states she is tired of waiting.

## 2020-10-01 NOTE — ED Provider Notes (Signed)
[facility] Emergency Department Provider Note   ____________________________________________   Event Date/Time   First MD Initiated Contact with Patient 10/01/20 1832     (approximate)  I have reviewed the triage vital signs and the nursing notes.   HISTORY  Chief Complaint Hypertension    HPI Kristy Kirby is a 45 y.o. female who presents from urgent care complaining of bilateral lower extremity cramping  LOCATION: Bilateral lower extremities DURATION: 3 days prior to arrival TIMING: Worsening since onset SEVERITY: Severe QUALITY: Cramping CONTEXT: Patient states that this cramping was intermittent 3 days ago but is gotten worse and, more constant.  Patient states that this cramping usually occurs when her potassium is low.  She presented to the urgent care with these complaints and they told her that her blood pressure was too high and needed to present to the emergency department MODIFYING FACTORS: States that laying flat worsens this cramping pain and walking somewhat relieves it ASSOCIATED SYMPTOMS: Hypertension, tachycardia   Per medical record review, patient has history of anxiety/depression, hypertension, and rheumatoid arthritis          Past Medical History:  Diagnosis Date   Anxiety    Arthritis    Rheumatoid   Depression    HTN (hypertension)    Sciatica    Seizures (HCC)     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   Hypertension 01/03/2012   Insomnia 01/03/2012   Seizure disorder, grand mal (HCC) 01/03/2012   Uterine adnexal pain 01/03/2012    Past Surgical History:  Procedure Laterality Date   TUBAL LIGATION      Prior to Admission medications   Medication Sig Start Date End Date Taking? Authorizing Provider  albuterol (VENTOLIN HFA) 108 (90 Base) MCG/ACT inhaler  Inhale 1-2 puffs into the lungs every 6 (six) hours as needed for wheezing or shortness of breath. 09/13/19   [name], MD  cyclobenzaprine (FLEXERIL) 10 MG tablet Take 1 tablet (10 mg total) by mouth 3 (three) times daily as needed. 09/06/20   Sherrie Mustache Roselyn Bering, PA-C  FLUoxetine (PROZAC) 10 MG capsule Take 1 capsule by mouth once daily 11/21/19   [name], MD  hydrochlorothiazide (HYDRODIURIL) 25 MG tablet Take 1 tablet (25 mg total) by mouth daily. 09/13/19   [name], MD  HYDROcodone-acetaminophen (NORCO/VICODIN) 5-325 MG tablet Take 1 tablet by mouth every 4 (four) hours as needed for moderate pain. 06/03/20 06/03/21  Minna Antis, MD  levETIRAcetam (KEPPRA) 500 MG tablet Take 1 tablet (500 mg total) by mouth 2 (two) times daily. 08/17/19 09/16/19  [name], MD  lisinopril (ZESTRIL) 10 MG tablet Take by mouth. 04/18/17   [provider]  losartan (COZAAR) 50 MG tablet Take 1 tablet (50 mg total) by mouth daily. 09/13/19   [name], MD  methylPREDNISolone (MEDROL DOSEPAK) 4 MG TBPK tablet Take 6 pills on day one then decrease by 1 pill each day 09/06/20   Faythe Ghee, PA-C  metoprolol tartrate (LOPRESSOR) 25 MG tablet Take 1 tablet (25 mg total) by mouth 2 (two) times daily. 09/13/19   [name], MD  phenytoin (DILANTIN) 100 MG ER capsule Take 3 capsules (300 mg total) by mouth 2 (two) times daily. 06/29/19 10/27/19  Emily Filbert, MD  potassium chloride SA (KLOR-CON M20) 20 MEQ  tablet Take 1 tablet (20 mEq total) by mouth daily. 08/17/19   [name], MD    Allergies Banana, Coconut fatty acids, Penicillins, Sulfa antibiotics, and Coconut oil  Family History  Problem Relation Age of Onset   Hypertension Mother    Diabetes Mother    Stroke Mother    Hypertension Father    Diabetes Father    Heart disease Father    Stroke Father    Diabetes Sister    Stroke Paternal Grandmother    Stroke Paternal Grandfather     Social  History Social History   Tobacco Use   Smoking status: Former    Packs/day: 0.50    Types: Cigarettes   Smokeless tobacco: Never  Vaping Use   Vaping Use: Every day  Substance Use Topics   Alcohol use: No   Drug use: No    Review of Systems Constitutional: No fever/chills Eyes: No visual changes. ENT: No sore throat. Cardiovascular: Denies chest pain. Respiratory: Denies shortness of breath. Gastrointestinal: No abdominal pain.  No nausea, no vomiting.  No diarrhea. Genitourinary: Negative for dysuria. Musculoskeletal: Positive for acute bilateral lower extremity cramping Skin: Negative for rash. Neurological: Negative for headaches, weakness/numbness/paresthesias in any extremity Psychiatric: Negative for suicidal ideation/homicidal ideation   ____________________________________________   PHYSICAL EXAM:  VITAL SIGNS: ED Triage Vitals  Enc Vitals Group     BP 10/01/20 1557 (!) 181/146     Pulse Rate 10/01/20 1557 (!) 126     Resp 10/01/20 1557 18     Temp 10/01/20 1557 98.6 F (37 C)     Temp src --      SpO2 10/01/20 1557 95 %     Weight 10/01/20 1558 240 lb 4.8 oz (109 kg)     Height 10/01/20 1558 5\' 3"  (1.6 m)     Head Circumference --      Peak Flow --      Pain Score 10/01/20 1558 0     Pain Loc --      Pain Edu? --      Excl. in GC? --    Constitutional: Alert and oriented. Well appearing morbidly obese middle-aged African-American female in no acute distress. Eyes: Conjunctivae are normal. PERRL. Head: Atraumatic. Nose: No congestion/rhinnorhea. Mouth/Throat: Mucous membranes are moist. Neck: No stridor Cardiovascular: Grossly normal heart sounds.  Good peripheral circulation. Respiratory: Normal respiratory effort.  No retractions. Gastrointestinal: Soft and nontender. No distention. Musculoskeletal: No obvious deformities Neurologic:  Normal speech and language. No gross focal neurologic deficits are appreciated. Skin:  Skin is warm and dry.  No rash noted. Psychiatric: Mood and affect are normal. Speech and behavior are normal.  ____________________________________________   LABS (all labs ordered are listed, but only abnormal results are displayed)  Labs Reviewed  CBC - Abnormal; Notable for the following components:      Result Value   WBC 15.8 (*)    Hemoglobin 15.3 (*)    All other components within normal limits  BASIC METABOLIC PANEL - Abnormal; Notable for the following components:   Glucose, Bld 147 (*)    Creatinine, Ser 1.10 (*)    All other components within normal limits  D-DIMER, QUANTITATIVE - Abnormal; Notable for the following components:   D-Dimer, Quant 0.71 (*)    All other components within normal limits  RESP PANEL BY RT-PCR (FLU A&B, COVID) ARPGX2  TROPONIN I (HIGH SENSITIVITY)  TROPONIN I (HIGH SENSITIVITY)   ____________________________________________  EKG  ED ECG REPORT I,  Merwyn Katos, the attending physician, personally viewed and interpreted this ECG.  Date: 10/01/2020 EKG Time: 1603 Rate: 128 Rhythm: Tachycardic sinus rhythm QRS Axis: normal Intervals: normal ST/T Wave abnormalities: normal Narrative Interpretation: Tachycardic sinus rhythm.  No evidence of acute ischemia   PROCEDURES  Procedure(s) performed (including Critical Care):  Procedures   ____________________________________________   INITIAL IMPRESSION / ASSESSMENT AND PLAN / ED COURSE  As part of my medical decision making, I reviewed the following data within the electronic medical record, if available:  Nursing notes reviewed and incorporated, Labs reviewed, EKG interpreted, Old chart reviewed, Radiograph reviewed and Notes from prior ED visits reviewed and incorporated        Presents to the emergency department complaining of high blood pressure. Patient is otherwise asymptomatic without confusion, chest pain, hematuria, or SOB. Denies nonadherence to antihypertensive regimen DDx: CV, AMI, heart  failure, renal infarction or failure or other end organ damage. During patient's work-up, she remained tachycardic and was given a dimer that was slightly elevated.  Patient was scheduled for pretreatment to receive iodinated contrast that she says she has an allergy to in the past.  During this pretreatment, she became frustrated and left AMA after having her IV removed before I could speak to her about any further care. Disposition: Eloped      ____________________________________________   FINAL CLINICAL IMPRESSION(S) / ED DIAGNOSES  Final diagnoses:  Primary hypertension  Bilateral leg cramps     ED Discharge Orders     None        Note:  This document was prepared using Dragon voice recognition software and may include unintentional dictation errors.    Merwyn Katos, MD 10/01/20 2149

## 2021-03-16 ENCOUNTER — Ambulatory Visit (INDEPENDENT_AMBULATORY_CARE_PROVIDER_SITE_OTHER): Payer: BC Managed Care – PPO | Admitting: Obstetrics and Gynecology

## 2021-03-16 ENCOUNTER — Other Ambulatory Visit (HOSPITAL_COMMUNITY)
Admission: RE | Admit: 2021-03-16 | Discharge: 2021-03-16 | Disposition: A | Discharge status: Home / Self Care | Payer: BC Managed Care – PPO | Source: Ambulatory Visit | Attending: Obstetrics and Gynecology | Admitting: Obstetrics and Gynecology

## 2021-03-16 ENCOUNTER — Other Ambulatory Visit: Payer: Self-pay

## 2021-03-16 ENCOUNTER — Encounter: Payer: Self-pay | Admitting: Obstetrics and Gynecology

## 2021-03-16 VITALS — BP 140/90 | Ht 61.0 in | Wt 254.0 lb

## 2021-03-16 DIAGNOSIS — R102 Pelvic and perineal pain: Secondary | ICD-10-CM

## 2021-03-16 DIAGNOSIS — N3001 Acute cystitis with hematuria: Secondary | ICD-10-CM | POA: Diagnosis not present

## 2021-03-16 DIAGNOSIS — Z113 Encounter for screening for infections with a predominantly sexual mode of transmission: Secondary | ICD-10-CM | POA: Insufficient documentation

## 2021-03-16 LAB — POCT URINALYSIS DIPSTICK
Bilirubin, UA: NEGATIVE
Glucose, UA: NEGATIVE
Ketones, UA: NEGATIVE
Nitrite, UA: NEGATIVE
Protein, UA: NEGATIVE
Spec Grav, UA: 1.025 (ref 1.010–1.025)
pH, UA: 6 (ref 5.0–8.0)

## 2021-03-16 LAB — POCT WET PREP WITH KOH
Clue Cells Wet Prep HPF POC: NEGATIVE
KOH Prep POC: NEGATIVE
Trichomonas, UA: NEGATIVE
Yeast Wet Prep HPF POC: NEGATIVE

## 2021-03-16 MED ORDER — NITROFURANTOIN MONOHYD MACRO 100 MG PO CAPS: 100.0000 mg | ORAL_CAPSULE | Freq: Two times a day (BID) | ORAL | 0 refills | Status: AC

## 2021-03-16 MED ORDER — FLUCONAZOLE 150 MG PO TABS: 150.0000 mg | ORAL_TABLET | Freq: Once | ORAL | 0 refills | Status: AC

## 2021-03-16 NOTE — Progress Notes (Signed)
System, Provider Not In   Chief Complaint  Patient presents with   Vaginal Irritation    No discharge, odor   Urinary Tract Infection    Burning urinating, no frequency    HPI:      Kristy Kirby is a 46 y.o. [phone] whose LMP was Patient's last menstrual period was 11/14/2019., presents today for vaginal pain the day after sex, starting 3 days ago, no d/c or odor. Did sitz baths without relief, still having pain today. No recent abx use. Also with urinary frequency and dysuria with decreased flow, no LBP, pelvic pain, fevers.  She had a new partner 4 days ago, no pain/dryness/bleeding with sex.  She is postmenopausal, no PMB. No recent abx use.  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   Hypertension 01/03/2012   Insomnia 01/03/2012   Seizure disorder, grand mal (HCC) 01/03/2012   Uterine adnexal pain 01/03/2012    Past Surgical History:  Procedure Laterality Date   TUBAL LIGATION      Family History  Problem Relation Age of Onset   Hypertension Mother    Diabetes Mother    Stroke Mother    Hypertension Father    Diabetes Father    Heart disease Father    Stroke Father    Diabetes Sister    Stroke Paternal Grandmother    Stroke Paternal Grandfather     Social History   Socioeconomic History   Marital status: Single    Spouse name: Not on file   Number of children: Not on file   Years of education: Not on file   Highest education level: Not on file  Occupational History   Not on file  Tobacco Use   Smoking status: Former    Packs/day: 0.50    Types: Cigarettes   Smokeless tobacco: Never  Vaping Use   Vaping Use: Every day  Substance and Sexual Activity   Alcohol use: No   Drug use: No   Sexual activity: Yes    Birth control/protection: None  Other  Topics Concern   Not on file  Social History Narrative   Not on file   Social Determinants of Health   Financial Resource Strain: Not on file  Food Insecurity: Not on file  Transportation Needs: Not on file  Physical Activity: Not on file  Stress: Not on file  Social Connections: Not on file  Intimate Partner Violence: Not on file    Outpatient Medications Prior to Visit  Medication Sig Dispense Refill   albuterol (VENTOLIN HFA) 108 (90 Base) MCG/ACT inhaler Inhale 1-2 puffs into the lungs every 6 (six) hours as needed for wheezing or shortness of breath. 6.7 g 11   clonazePAM (KLONOPIN) 0.5 MG tablet Take by mouth.     cyclobenzaprine (FLEXERIL) 10 MG tablet Take 1 tablet (10 mg total) by mouth 3 (three) times daily as needed. 30 tablet 0   escitalopram (LEXAPRO) 10 MG tablet Take by mouth.     FLUoxetine (PROZAC) 10 MG capsule Take 1 capsule by mouth once daily 30 capsule 0   hydrochlorothiazide (HYDRODIURIL) 25 MG tablet Take 1 tablet (25 mg total) by mouth daily. 30 tablet 2   lisinopril (ZESTRIL) 10 MG tablet Take by mouth.     losartan (COZAAR)  100 MG tablet Take by mouth.     metoprolol tartrate (LOPRESSOR) 25 MG tablet Take 1 tablet (25 mg total) by mouth 2 (two) times daily. 60 tablet 1   potassium chloride SA (KLOR-CON M20) 20 MEQ tablet Take 1 tablet (20 mEq total) by mouth daily. 90 tablet 1   trazodone (DESYREL) 300 MG tablet Take 300 mg by mouth at bedtime.     levETIRAcetam (KEPPRA) 500 MG tablet Take 1 tablet (500 mg total) by mouth 2 (two) times daily. 60 tablet 1   phenytoin (DILANTIN) 100 MG ER capsule Take 3 capsules (300 mg total) by mouth 2 (two) times daily. 180 capsule 3   HYDROcodone-acetaminophen (NORCO/VICODIN) 5-325 MG tablet Take 1 tablet by mouth every 4 (four) hours as needed for moderate pain. 10 tablet 0   losartan (COZAAR) 50 MG tablet Take 1 tablet (50 mg total) by mouth daily. 60 tablet 0   methylPREDNISolone (MEDROL DOSEPAK) 4 MG TBPK tablet Take  6 pills on day one then decrease by 1 pill each day 21 tablet 0   No facility-administered medications prior to visit.      ROS:  Review of Systems  Constitutional:  Negative for fever.  Gastrointestinal:  Negative for blood in stool, constipation, diarrhea, nausea and vomiting.  Genitourinary:  Positive for dysuria, frequency and vaginal pain. Negative for dyspareunia, flank pain, hematuria, urgency, vaginal bleeding and vaginal discharge.  Musculoskeletal:  Negative for back pain.  Skin:  Negative for rash.  BREAST: No symptoms   OBJECTIVE:   Vitals:  BP 140/90    Ht 5\' 1"  (1.549 m)    Wt 254 lb (115.2 kg)    LMP 11/14/2019    BMI 47.99 kg/m   Physical Exam Vitals reviewed.  Constitutional:      Appearance: She is well-developed.  Pulmonary:     Effort: Pulmonary effort is normal.  Genitourinary:    Pubic Area: No rash.      Labia:        Right: Tenderness present. No rash or lesion.        Left: Tenderness present. No rash or lesion.      Vagina: Erythema present. No vaginal discharge or tenderness.     Cervix: Normal.     Uterus: Normal. Not enlarged and not tender.      Adnexa: Right adnexa normal and left adnexa normal.       Right: No mass or tenderness.         Left: No mass or tenderness.       Comments: BILAT LABIA MINORA WITH ERYTHEMA/SLIGHT SWELLING; NO BLEEDING BUT SLIGHT PINK TINGE ON STD QTIP FROM CX Musculoskeletal:        General: Normal range of motion.     Cervical back: Normal range of motion.  Skin:    General: Skin is warm and dry.  Neurological:     General: No focal deficit present.     Mental Status: She is alert and oriented to person, place, and time.  Psychiatric:        Mood and Affect: Mood normal.        Behavior: Behavior normal.        Thought Content: Thought content normal.        Judgment: Judgment normal.    Results: Results for orders placed or performed in visit on 03/16/21 (from the past 24 hour(s))  POCT Urinalysis  Dipstick     Status: Abnormal   Collection Time: 03/16/21  5:23 PM  Result Value Ref Range   Color, UA yellow    Clarity, UA clear    Glucose, UA Negative Negative   Bilirubin, UA neg    Ketones, UA neg    Spec Grav, UA 1.025 1.010 - 1.025   Blood, UA large    pH, UA 6.0 5.0 - 8.0   Protein, UA Negative Negative   Urobilinogen, UA     Nitrite, UA neg    Leukocytes, UA Moderate (2+) (A) Negative   Appearance     Odor    POCT Wet Prep with KOH     Status: Normal   Collection Time: 03/16/21  5:23 PM  Result Value Ref Range   Trichomonas, UA Negative    Clue Cells Wet Prep HPF POC neg    Epithelial Wet Prep HPF POC     Yeast Wet Prep HPF POC neg    Bacteria Wet Prep HPF POC     RBC Wet Prep HPF POC     WBC Wet Prep HPF POC     KOH Prep POC Negative Negative     Assessment/Plan: Acute cystitis with hematuria - Plan: nitrofurantoin, macrocrystal-monohydrate, (MACROBID) 100 MG capsule, Urine Culture, POCT Urinalysis Dipstick; pos sx and UA. Rx macrobid, check C&S. F/u prn.   Vaginal pain - Plan: fluconazole (DIFLUCAN) 150 MG tablet, POCT Wet Prep with KOH, Cervicovaginal ancillary only; tender on exam, pos exam, neg wet prep. Rx diflucan, check STD culture. Will f/u prn.   Screening for STD (sexually transmitted disease) - Plan: Cervicovaginal ancillary only    Meds ordered this encounter  Medications   nitrofurantoin, macrocrystal-monohydrate, (MACROBID) 100 MG capsule    Sig: Take 1 capsule (100 mg total) by mouth 2 (two) times daily for 5 days.    Dispense:  10 capsule    Refill:  0    Order Specific Question:   Supervising Provider    Answer:   Gae Dry [id number]   fluconazole (DIFLUCAN) 150 MG tablet    Sig: Take 1 tablet (150 mg total) by mouth once for 1 dose.    Dispense:  1 tablet    Refill:  0    Order Specific Question:   Supervising Provider    Answer:   Gae Dry [id number]      Return if symptoms worsen or fail to improve.  Shakita Keir B.  Lanell Carpenter, PA-C 03/16/2021 5:28 PM

## 2021-03-17 LAB — CERVICOVAGINAL ANCILLARY ONLY
Bacterial Vaginitis (gardnerella): POSITIVE — AB
Candida Glabrata: NEGATIVE
Candida Vaginitis: NEGATIVE
Chlamydia: NEGATIVE
Comment: NEGATIVE
Comment: NEGATIVE
Comment: NEGATIVE
Comment: NEGATIVE
Comment: NEGATIVE
Comment: NORMAL
Neisseria Gonorrhea: NEGATIVE
Trichomonas: POSITIVE — AB

## 2021-03-18 ENCOUNTER — Telehealth: Payer: Self-pay | Admitting: Obstetrics and Gynecology

## 2021-03-18 DIAGNOSIS — A599 Trichomoniasis, unspecified: Secondary | ICD-10-CM

## 2021-03-18 DIAGNOSIS — B9689 Other specified bacterial agents as the cause of diseases classified elsewhere: Secondary | ICD-10-CM

## 2021-03-18 LAB — URINE CULTURE

## 2021-03-18 MED ORDER — METRONIDAZOLE 500 MG PO TABS: ORAL_TABLET | ORAL | 0 refills | Status: DC

## 2021-03-18 NOTE — Telephone Encounter (Signed)
Pt aware of BV and trich on culture. Hx of trich on pap 7/21, neg recheck 6/22, and positive again this time. Pt still having vag sx, not as bad. No relief with diflucan, as to be expected with culture results. Rx flagyl eRxd. Partner needs tx, no sex for 1 wk after both completed tx. RTO in 4 wks for TOC.  Waiting for urine C&S results. On macrobid, sx somewhat improved.

## 2021-04-13 ENCOUNTER — Other Ambulatory Visit (HOSPITAL_COMMUNITY)
Admission: RE | Admit: 2021-04-13 | Discharge: 2021-04-13 | Disposition: A | Discharge status: Home / Self Care | Payer: BC Managed Care – PPO | Source: Ambulatory Visit | Attending: Obstetrics and Gynecology | Admitting: Obstetrics and Gynecology

## 2021-04-13 ENCOUNTER — Encounter: Payer: Self-pay | Admitting: Obstetrics and Gynecology

## 2021-04-13 ENCOUNTER — Ambulatory Visit (INDEPENDENT_AMBULATORY_CARE_PROVIDER_SITE_OTHER): Payer: BC Managed Care – PPO | Admitting: Obstetrics and Gynecology

## 2021-04-13 ENCOUNTER — Other Ambulatory Visit: Payer: Self-pay

## 2021-04-13 VITALS — BP 142/90 | Ht 61.0 in | Wt 254.0 lb

## 2021-04-13 DIAGNOSIS — Z780 Asymptomatic menopausal state: Secondary | ICD-10-CM | POA: Diagnosis not present

## 2021-04-13 DIAGNOSIS — N950 Postmenopausal bleeding: Secondary | ICD-10-CM | POA: Insufficient documentation

## 2021-04-13 NOTE — Progress Notes (Signed)
System, Provider Not In   Chief Complaint  Patient presents with   Amenorrhea    Last cycle 03/2018, started bleeding 04/10/21. Heavy flow, no abnormal pain    HPI:      Kristy Kirby is a 46 y.o. [phone] whose LMP was Patient's last menstrual period was 11/14/2019., presents today for PMB. LMP 2020 but never eval for early menopause vs amenorrhea. Started with bleeding 4 days ago, changing pads every few hrs with dime sized clots. Had cramping initially that resolved. No pain now. Flow is still mod. Had spotting after sex 6/22 and thought to be friable cx. Neg pap/neg HPV DNA 7/21; neg gon/chlam 6/22; hx of trich in 2021.  Grossly normal uterus/ovaries on CT pelvis 7/22. She has not been sex active since 6/22.   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   Hypertension 01/03/2012   Insomnia 01/03/2012   Seizure disorder, grand mal (HCC) 01/03/2012   Uterine adnexal pain 01/03/2012    Past Surgical History:  Procedure Laterality Date   TUBAL LIGATION      Family History  Problem Relation Age of Onset   Hypertension Mother    Diabetes Mother    Stroke Mother    Hypertension Father    Diabetes Father    Heart disease Father    Stroke Father    Diabetes Sister    Stroke Paternal Grandmother    Stroke Paternal Grandfather     Social History   Socioeconomic History   Marital status: Single    Spouse name: Not on file   Number of children: Not on file   Years of education: Not on file   Highest education level: Not on file  Occupational History   Not on file  Tobacco Use   Smoking status: Former    Packs/day: 0.50    Types: Cigarettes   Smokeless tobacco: Never  Vaping Use   Vaping Use: Every day  Substance and Sexual Activity   Alcohol use: No   Drug use: No   Sexual activity: Yes    Birth control/protection:  None  Other Topics Concern   Not on file  Social History Narrative   Not on file   Social Determinants of Health   Financial Resource Strain: Not on file  Food Insecurity: Not on file  Transportation Needs: Not on file  Physical Activity: Not on file  Stress: Not on file  Social Connections: Not on file  Intimate Partner Violence: Not on file    Outpatient Medications Prior to Visit  Medication Sig Dispense Refill   albuterol (VENTOLIN HFA) 108 (90 Base) MCG/ACT inhaler Inhale 1-2 puffs into the lungs every 6 (six) hours as needed for wheezing or shortness of breath. 6.7 g 11   clonazePAM (KLONOPIN) 0.5 MG tablet Take by mouth.     cyclobenzaprine (FLEXERIL) 10 MG tablet Take 1 tablet (10 mg total) by mouth 3 (three) times daily as needed. 30 tablet 0   escitalopram (LEXAPRO) 10 MG tablet Take by mouth.     FLUoxetine (PROZAC) 10 MG capsule Take 1 capsule by mouth once daily 30 capsule 0   hydrochlorothiazide (HYDRODIURIL) 25 MG tablet Take 1 tablet (25 mg total) by mouth daily. 30 tablet 2   lisinopril (ZESTRIL) 10 MG tablet Take by mouth.     losartan (COZAAR) 100  MG tablet Take by mouth.     metoprolol tartrate (LOPRESSOR) 25 MG tablet Take 1 tablet (25 mg total) by mouth 2 (two) times daily. 60 tablet 1   potassium chloride SA (KLOR-CON M20) 20 MEQ tablet Take 1 tablet (20 mEq total) by mouth daily. 90 tablet 1   trazodone (DESYREL) 300 MG tablet Take 300 mg by mouth at bedtime.     levETIRAcetam (KEPPRA) 500 MG tablet Take 1 tablet (500 mg total) by mouth 2 (two) times daily. 60 tablet 1   phenytoin (DILANTIN) 100 MG ER capsule Take 3 capsules (300 mg total) by mouth 2 (two) times daily. 180 capsule 3   metroNIDAZOLE (FLAGYL) 500 MG tablet Take 2 tabs BID for 1 day 4 tablet 0   No facility-administered medications prior to visit.      ROS:  Review of Systems  Constitutional:  Negative for fever.  Gastrointestinal:  Negative for blood in stool, constipation, diarrhea,  nausea and vomiting.  Genitourinary:  Positive for vaginal bleeding. Negative for dyspareunia, dysuria, flank pain, frequency, hematuria, urgency, vaginal discharge and vaginal pain.  Musculoskeletal:  Negative for back pain.  Skin:  Negative for rash.  BREAST: No symptoms   OBJECTIVE:   Vitals:  BP (!) 142/90    Ht 5\' 1"  (1.549 m)    Wt 254 lb (115.2 kg)    LMP 11/14/2019    BMI 47.99 kg/m   Physical Exam Vitals reviewed.  Constitutional:      Appearance: She is well-developed.  Pulmonary:     Effort: Pulmonary effort is normal.  Genitourinary:    General: Normal vulva.     Pubic Area: No rash.      Labia:        Right: No rash, tenderness or lesion.        Left: No rash, tenderness or lesion.      Vagina: Bleeding present. No vaginal discharge, erythema or tenderness.     Cervix: Normal.     Uterus: Normal. Not enlarged and not tender.      Adnexa: Right adnexa normal and left adnexa normal.       Right: No mass or tenderness.         Left: No mass or tenderness.    Musculoskeletal:        General: Normal range of motion.     Cervical back: Normal range of motion.  Skin:    General: Skin is warm and dry.  Neurological:     General: No focal deficit present.     Mental Status: She is alert and oriented to person, place, and time.  Psychiatric:        Mood and Affect: Mood normal.        Behavior: Behavior normal.        Thought Content: Thought content normal.        Judgment: Judgment normal.    Endometrial Biopsy After discussion with the patient regarding her abnormal uterine bleeding I recommended that she proceed with an endometrial biopsy for further diagnosis. The risks, benefits, alternatives, and indications for an endometrial biopsy were discussed with the patient in detail. She understood the risks including infection, bleeding, cervical laceration and uterine perforation.  Verbal consent was obtained.   PROCEDURE NOTE:  Pipelle endometrial biopsy was  performed using aseptic technique with iodine preparation.  The uterus was sounded to a length of 6.0 cm (question endometrial component but couldn't advance pipelle any further).  Question Adequate sampling was  obtained with minimal blood loss.  The patient tolerated the procedure well.  Disposition will be pending pathology.   Assessment/Plan: PMB (postmenopausal bleeding) - Plan: US PELVIS TRANSVAGINAL NON-OB (TV ONLY), Surgical pathology; bleeding on exam. Check labs, EMB, and GYN u/s. Will f/u with results. If neg labs/EMB and bleeding persists, will Rx aygestin. If labs/EMB/bleeding abn, will check STAT GYN u/s. Otherwise, scheduled in office in March (due to u/s shortage).  Menopause - Plan: FSH, Estradiol; check labs to see if truly postmenopausal.     Return in about 17 days (around 04/30/2021) for GYN u/s for PMB--[facility] to call with results.  Aayush Gelpi B. Isabel Freese, PA-C 04/13/2021 4:24 PM

## 2021-04-14 ENCOUNTER — Telehealth: Payer: Self-pay | Admitting: Obstetrics and Gynecology

## 2021-04-14 ENCOUNTER — Other Ambulatory Visit: Payer: Self-pay | Admitting: Obstetrics and Gynecology

## 2021-04-14 LAB — ESTRADIOL: Estradiol: 40.2 pg/mL

## 2021-04-14 LAB — FOLLICLE STIMULATING HORMONE: FSH: 28.3 m[IU]/mL

## 2021-04-14 MED ORDER — NORETHINDRONE ACETATE 5 MG PO TABS: 5.0000 mg | ORAL_TABLET | Freq: Every day | ORAL | 0 refills | Status: DC

## 2021-04-14 NOTE — Telephone Encounter (Signed)
PT called because she rec'd the result in mychart and don't understand. Want you to call her and go over them with her. PT also said that you could give her some pills that will stop the bleeding so was wondering if you could send to pharmacy. And asked if you could write her a note for work today. Thanks

## 2021-04-14 NOTE — Progress Notes (Signed)
Rx aygestin 5 mg for AUB.  Would rather wait for EMB results but pt getting married in 5 days. Labs not conclusive for postmenopause. Awaiting EMB and GYN u/s results. Also needs note for work today. Changing pads and tampons every couple hrs, soiled clothes at work since they wouldn't let her go to bathroom.

## 2021-04-14 NOTE — Telephone Encounter (Signed)
Note sent via mychart

## 2021-04-14 NOTE — Telephone Encounter (Signed)
Pt aware. Can you get her a note for work today and send to her via MyChart? Thx.

## 2021-04-14 NOTE — Telephone Encounter (Signed)
Done on different note °

## 2021-04-14 NOTE — Telephone Encounter (Signed)
Pt has received her test results in MyChart and does not understand them.  She would like for you to give her a call.

## 2021-04-15 LAB — SURGICAL PATHOLOGY

## 2021-04-22 ENCOUNTER — Telehealth: Payer: Self-pay

## 2021-04-22 ENCOUNTER — Other Ambulatory Visit: Payer: Self-pay | Admitting: Obstetrics and Gynecology

## 2021-04-22 DIAGNOSIS — N950 Postmenopausal bleeding: Secondary | ICD-10-CM

## 2021-04-22 MED ORDER — NORETHINDRONE ACETATE 5 MG PO TABS: 5.0000 mg | ORAL_TABLET | Freq: Every day | ORAL | 0 refills | Status: DC

## 2021-04-22 NOTE — Telephone Encounter (Signed)
Patient is calling with vaginal bleeding. Patient reports have to change pad 3 times since 6 am. Please advise

## 2021-04-22 NOTE — Telephone Encounter (Signed)
Pt with PMB vs AUB (early menopause vs amenorrhea never confirmed); took aygestin for 7 days and bleeding stopped for a few days. Took last pill 2 days ago and awoke this AM bleeding again. Had neg EMB. Awaiting u/s on 04/30/21. Rx RF aygestin in meantime until we get u/s results. Pt denies any pain.  ?

## 2021-04-30 ENCOUNTER — Other Ambulatory Visit: Payer: Self-pay

## 2021-04-30 ENCOUNTER — Ambulatory Visit (INDEPENDENT_AMBULATORY_CARE_PROVIDER_SITE_OTHER): Payer: Self-pay

## 2021-04-30 ENCOUNTER — Other Ambulatory Visit: Payer: Self-pay | Admitting: Obstetrics and Gynecology

## 2021-04-30 DIAGNOSIS — N950 Postmenopausal bleeding: Secondary | ICD-10-CM

## 2021-06-03 ENCOUNTER — Other Ambulatory Visit: Payer: Self-pay

## 2021-06-03 ENCOUNTER — Emergency Department
Admission: EM | Admit: 2021-06-03 | Discharge: 2021-06-03 | Disposition: A | Discharge status: Home / Self Care | Payer: Self-pay | Attending: Emergency Medicine | Admitting: Emergency Medicine

## 2021-06-03 DIAGNOSIS — G40409 Other generalized epilepsy and epileptic syndromes, not intractable, without status epilepticus: Secondary | ICD-10-CM

## 2021-06-03 DIAGNOSIS — G40909 Epilepsy, unspecified, not intractable, without status epilepticus: Secondary | ICD-10-CM | POA: Insufficient documentation

## 2021-06-03 DIAGNOSIS — I10 Essential (primary) hypertension: Secondary | ICD-10-CM | POA: Insufficient documentation

## 2021-06-03 LAB — CBC WITH DIFFERENTIAL/PLATELET
Abs Immature Granulocytes: 0.02 10*3/uL (ref 0.00–0.07)
Basophils Absolute: 0 10*3/uL (ref 0.0–0.1)
Basophils Relative: 0 %
Eosinophils Absolute: 0.1 10*3/uL (ref 0.0–0.5)
Eosinophils Relative: 1 %
HCT: 41.1 % (ref 36.0–46.0)
Hemoglobin: 13.3 g/dL (ref 12.0–15.0)
Immature Granulocytes: 0 %
Lymphocytes Relative: 32 %
Lymphs Abs: 3 10*3/uL (ref 0.7–4.0)
MCH: 29.2 pg (ref 26.0–34.0)
MCHC: 32.4 g/dL (ref 30.0–36.0)
MCV: 90.3 fL (ref 80.0–100.0)
Monocytes Absolute: 0.5 10*3/uL (ref 0.1–1.0)
Monocytes Relative: 5 %
Neutro Abs: 5.8 10*3/uL (ref 1.7–7.7)
Neutrophils Relative %: 62 %
Platelets: 229 10*3/uL (ref 150–400)
RBC: 4.55 MIL/uL (ref 3.87–5.11)
RDW: 15 % (ref 11.5–15.5)
WBC: 9.5 10*3/uL (ref 4.0–10.5)
nRBC: 0 % (ref 0.0–0.2)

## 2021-06-03 LAB — BASIC METABOLIC PANEL
Anion gap: 8 (ref 5–15)
BUN: 12 mg/dL (ref 6–20)
CO2: 23 mmol/L (ref 22–32)
Calcium: 8.1 mg/dL — ABNORMAL LOW (ref 8.9–10.3)
Chloride: 107 mmol/L (ref 98–111)
Creatinine, Ser: 1.02 mg/dL — ABNORMAL HIGH (ref 0.44–1.00)
GFR, Estimated: >60 mL/min (ref >60)
Glucose, Bld: 99 mg/dL (ref 70–99)
Potassium: 3.4 mmol/L — ABNORMAL LOW (ref 3.5–5.1)
Sodium: 138 mmol/L (ref 135–145)

## 2021-06-03 MED ORDER — LEVETIRACETAM 500 MG PO TABS: 500.0000 mg | ORAL_TABLET | Freq: Two times a day (BID) | ORAL | 1 refills | Status: DC

## 2021-06-03 MED ORDER — SODIUM CHLORIDE 0.9 % IV BOLUS
1000.0000 mL | Freq: Once | INTRAVENOUS | Status: AC
  Administered 2021-06-03: 1000 mL via INTRAVENOUS

## 2021-06-03 MED ORDER — PHENYTOIN SODIUM EXTENDED 100 MG PO CAPS: 300.0000 mg | ORAL_CAPSULE | Freq: Two times a day (BID) | ORAL | 3 refills | Status: DC

## 2021-06-03 MED ORDER — LEVETIRACETAM IN NACL 1000 MG/100ML IV SOLN
1000.0000 mg | INTRAVENOUS | Status: AC
  Administered 2021-06-03: 1000 mg via INTRAVENOUS
  Filled 2021-06-03: qty 100

## 2021-06-03 NOTE — ED Triage Notes (Signed)
Pt has been out of Keppra for 3 days, pt at work working in [employer] and EMS reports pt coworker witnessed 3 seizures lasting approximately 30 seconds a piece approximately 30 seconds apart. Hx of HTN, takes BP meds, took them today. Pt reports seizures are set off by hot environment and stress.  ? ?191/118 ?98% RA ?94 HR ?BGL 119 ?

## 2021-06-03 NOTE — ED Provider Notes (Signed)
? ?[facility] ?Provider Note ? ? ? Event Date/Time  ? First MD Initiated Contact with Patient 06/03/21 1153   ?  (approximate) ? ? ?History  ? ?Seizures ? ? ?HPI ? ?Kristy Kirby is a 46 y.o. female with past medical history of hypertension and epilepsy on phenytoin and Keppra who comes to the ED due to seizure this morning.  Patient was in her usual state of health, at work when she had a seizure.  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  Currently she feels back to normal, denies any headache or vision change.  No neck pain.  No recent illness or fever. ?  ? ? ?Physical Exam  ? ?Triage Vital Signs: ?ED Triage Vitals  ?Enc Vitals Group  ?   BP 06/03/21 1148 (!) 196/132  ?   Pulse Rate 06/03/21 1148 90  ?   Resp 06/03/21 1148 16  ?   Temp 06/03/21 1148 98 ?F (36.7 ?C)  ?   Temp Source 06/03/21 1148 Oral  ?   SpO2 06/03/21 1148 97 %  ?   Weight 06/03/21 1151 240 lb (108.9 kg)  ?   Height 06/03/21 1151 5\' 2"  (1.575 m)  ?   Head Circumference --   ?   Peak Flow --   ?   Pain Score 06/03/21 1150 8  ?   Pain Loc --   ?   Pain Edu? --   ?   Excl. in GC? --   ? ? ?Most recent vital signs: ?Vitals:  ? 06/03/21 1200 06/03/21 1230  ?BP: (!) 174/116 (!) 187/109  ?Pulse: 93 87  ?Resp: 19 13  ?Temp:    ?SpO2: 93% 97%  ? ? ? ?General: Awake, no distress.  ?CV:  Good peripheral perfusion.  ?Resp:  Normal effort.  ?Abd:  No distention.  ?Other:  Full range of motion, no extremity tenderness, no C-spine tenderness.  Full range of motion in C-spine. ? ? ?ED Results / Procedures / Treatments  ? ?Labs ?(all labs ordered are listed, but only abnormal results are displayed) ?Labs Reviewed  ?BASIC METABOLIC PANEL - Abnormal; Notable for the following components:  ?    Result Value  ? Potassium 3.4 (*)   ? Creatinine, Ser 1.02 (*)   ? Calcium 8.1 (*)   ? All other components  within normal limits  ?CBC WITH DIFFERENTIAL/PLATELET  ? ? ? ?EKG ?Interpreted by me ?Sinus rhythm rate of 89.  Normal axis and intervals.  Poor R wave progression.  Normal ST segments and T waves. ? ? ? ?RADIOLOGY ? ? ? ? ?PROCEDURES: ? ?Critical Care performed: No ? ?Procedures ? ? ?MEDICATIONS ORDERED IN ED: ?Medications  ?sodium chloride 0.9 % bolus 1,000 mL (1,000 mLs Intravenous New Bag/Given 06/03/21 1159)  ?levETIRAcetam (KEPPRA) IVPB 1000 mg/100 mL premix (0 mg Intravenous Stopped 06/03/21 1217)  ? ? ? ?IMPRESSION / MDM / ASSESSMENT AND PLAN / ED COURSE  ?I reviewed the triage vital signs and the nursing notes. ?             ?               ? ?Differential diagnosis includes, but is not limited to, epilepsy decompensation, medication noncompliance, dehydration, electrolyte abnormality ? ? ? ?Patient presents with seizure in the setting of epilepsy and  exposure to known trigger along with medication noncompliance.  She is back to baseline currently.  This is an exacerbation of chronic problem.  Serum labs are unremarkable, mental status is normalized, no signs of significant trauma.  Vital signs unremarkable and exam is reassuring.  Does not require admission at this time ?  ? ? ?FINAL CLINICAL IMPRESSION(S) / ED DIAGNOSES  ? ?Final diagnoses:  ?Nonintractable epilepsy without status epilepticus, unspecified epilepsy type (HCC)  ? ? ? ?Rx / DC Orders  ? ?ED Discharge Orders   ? ?      Ordered  ?  phenytoin (DILANTIN) 100 MG ER capsule  2 times daily       ? 06/03/21 1318  ?  levETIRAcetam (KEPPRA) 500 MG tablet  2 times daily       ? 06/03/21 1318  ? ?  ?  ? ?  ? ? ? ?Note:  This document was prepared using Dragon voice recognition software and may include unintentional dictation errors. ?  ?Sharman Cheek, MD ?06/03/21 1322 ? ?

## 2021-06-27 IMAGING — CT CT HEAD W/O CM
4 series · 17 of 47 positions shown, 19 images · non-contrast
Comparison: 07/20/2016

CLINICAL DATA: Encephalopathy

EXAM:
CT HEAD WITHOUT CONTRAST
TECHNIQUE: Contiguous axial images were obtained from the base of the skull
through the vertex without intravenous contrast.

[Series 2: head bone · axial · 0.42mm/px · z∈[+67,+121]mm · 4 of 77 slices shown]
[im 8/77  bone]
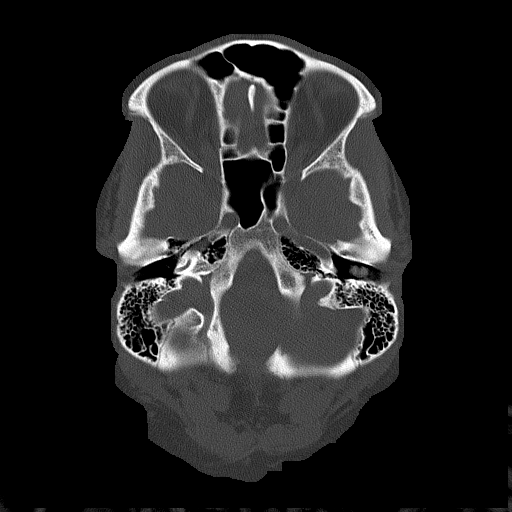
[im 16/77  bone]
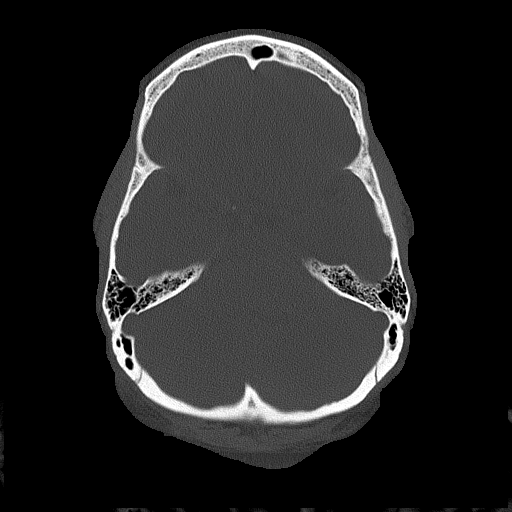
[im 23/77  bone]
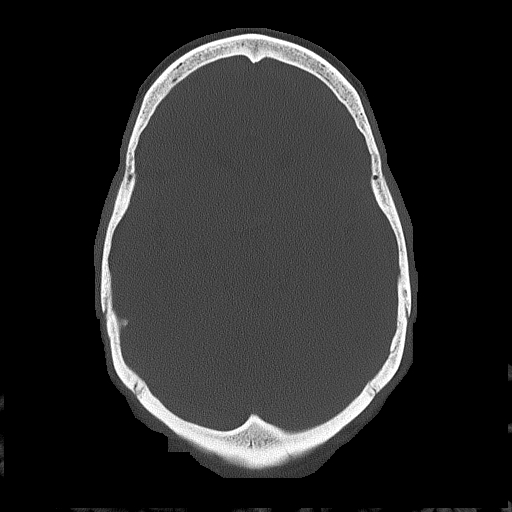
[im 35/77  bone]
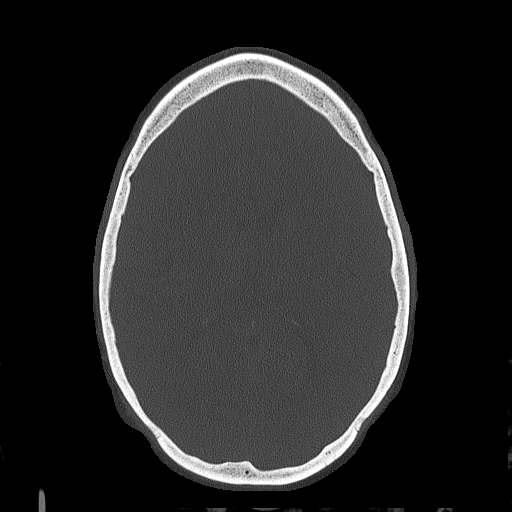

[Series 3: head wo · axial · 0.42mm/px · z∈[+68,+183]mm · 7 of 31 slices shown, 9 images]
[im 4/31  brain]
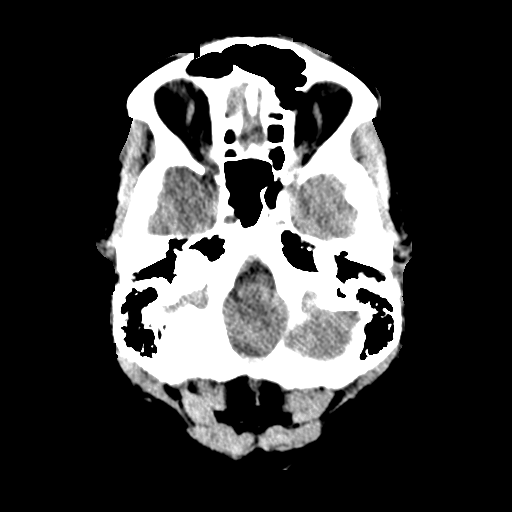
[im 4/31  bone]
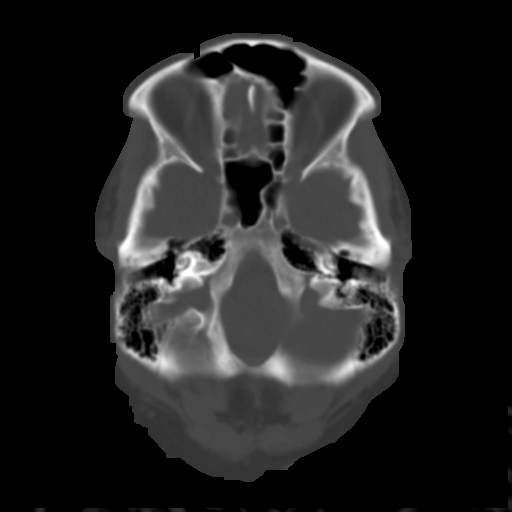
[im 8/31  brain]
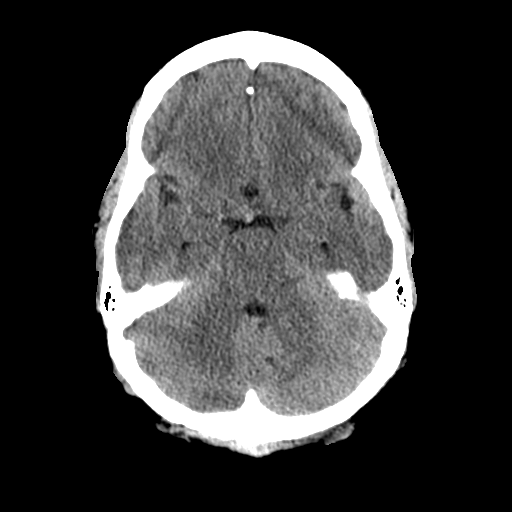
[im 12/31  brain]
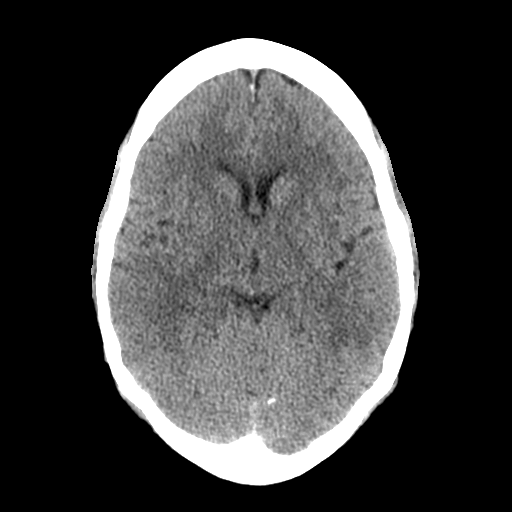
[im 16/31  brain]
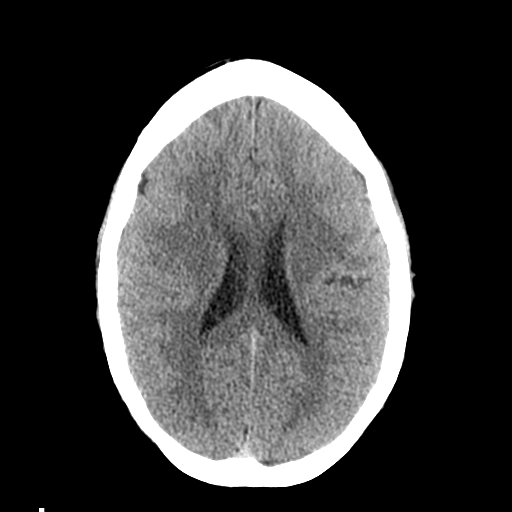
[im 19/31  brain]
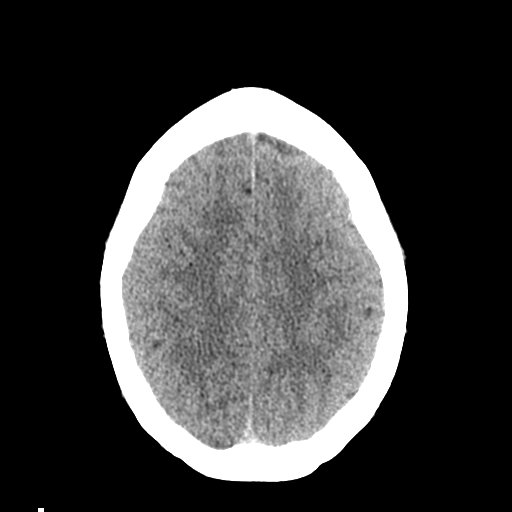
[im 19/31  bone]
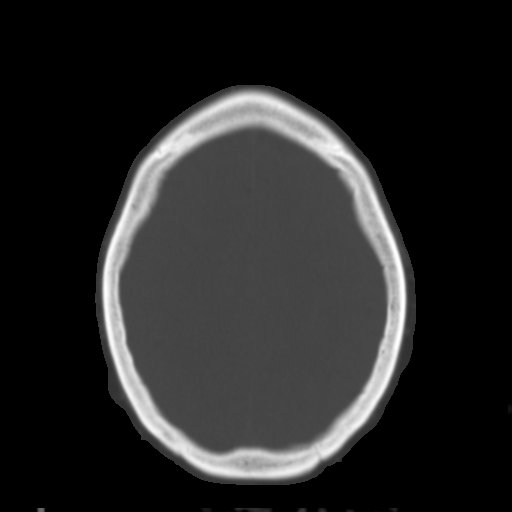
[im 23/31  brain]
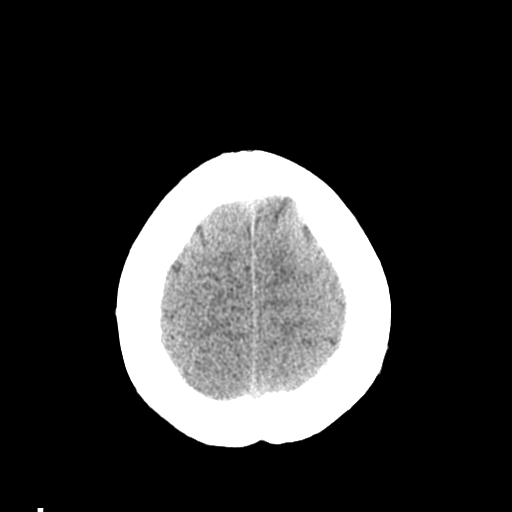
[im 27/31  brain]
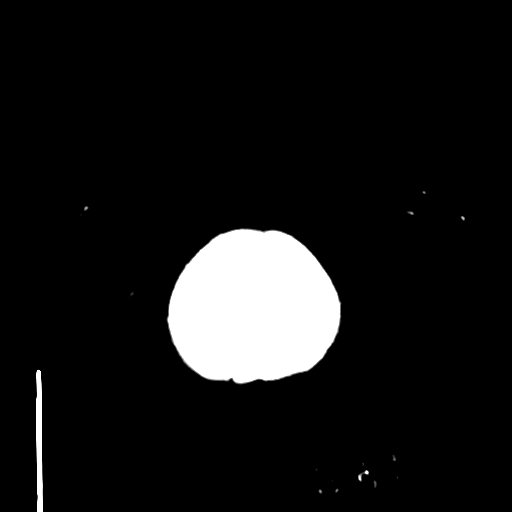

[Series 4: coronal soft tissue · coronal · 0.30mm/px · 3 of 66 slices shown]
[im 22/66  brain]
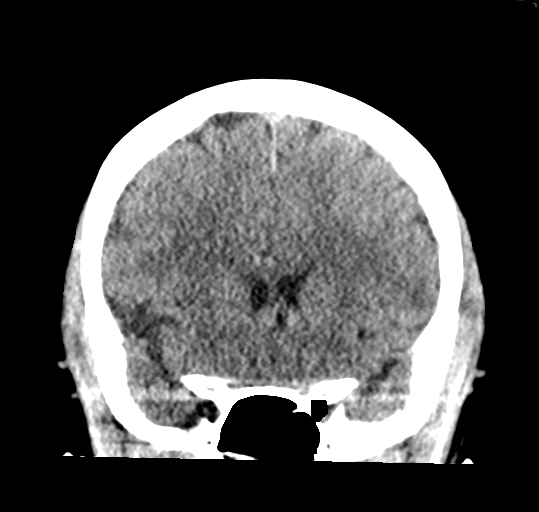
[im 29/66  brain]
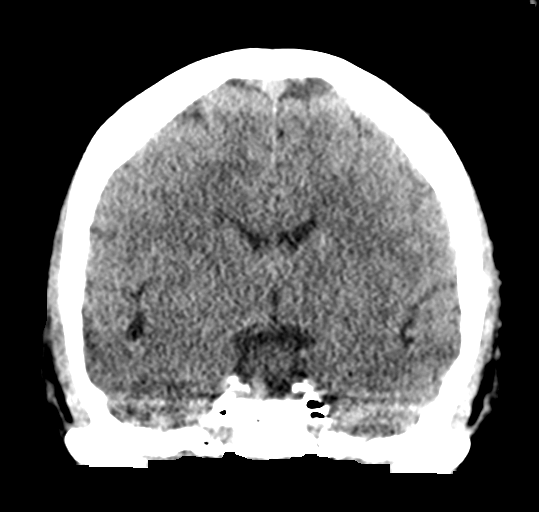
[im 37/66  brain]
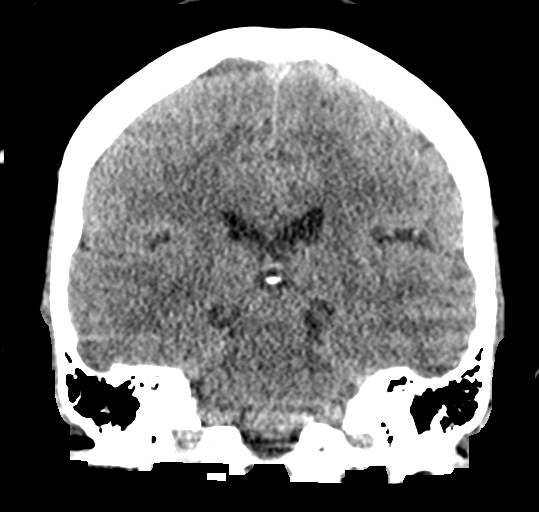

[Series 5: sagittal soft tissue · sagittal · 0.30mm/px · 3 of 53 slices shown]
[im 18/53  brain]
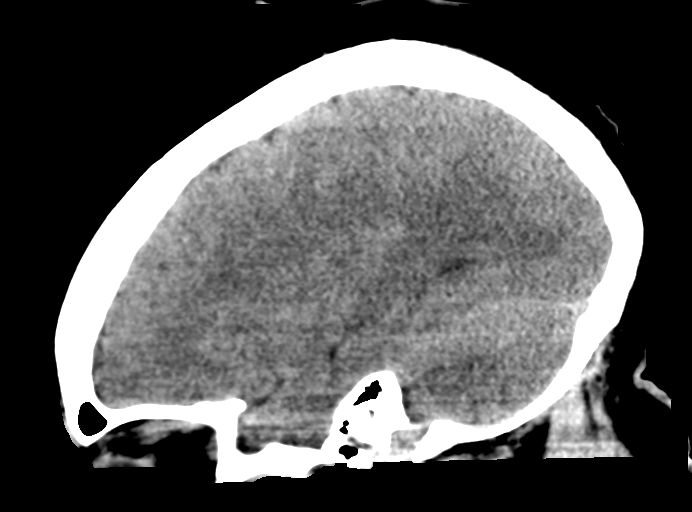
[im 27/53  brain]
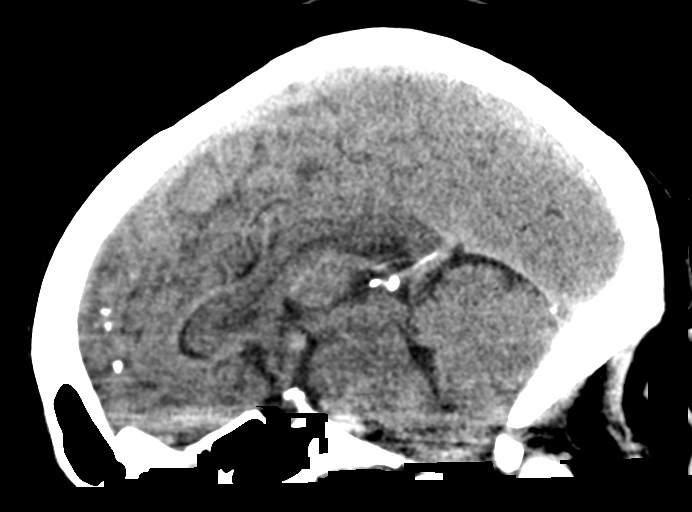
[im 35/53  brain]
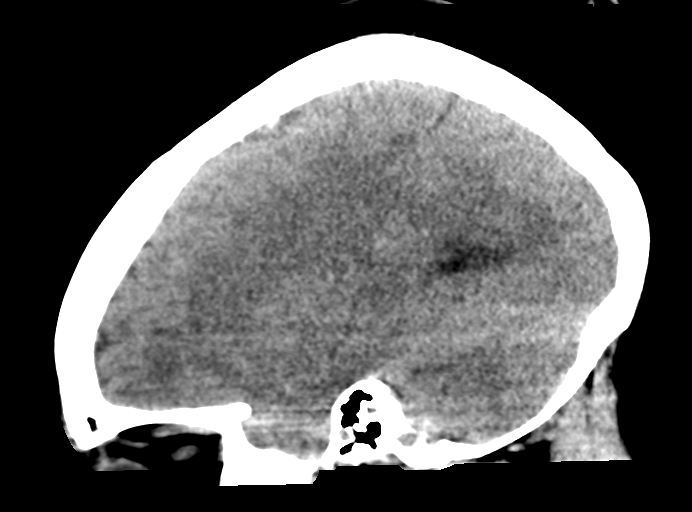

[17 of 47 positions shown; findings below may reference images not displayed]

FINDINGS: Brain: No acute intracranial abnormality. Specifically, no
hemorrhage, hydrocephalus, mass lesion, acute infarction, or
significant intracranial injury.

Vascular: No hyperdense vessel or unexpected calcification.

Skull: No acute calvarial abnormality.

Sinuses/Orbits: No acute findings

Other: None
IMPRESSION: No acute intracranial abnormality.

## 2021-07-08 ENCOUNTER — Ambulatory Visit: Payer: Self-pay

## 2021-07-08 NOTE — Telephone Encounter (Signed)
  Chief Complaint: diarrhea Symptoms: vomiting Frequency: 4 times a day Pertinent Negatives: Patient denies fever or blood in stool, abdominal pain, mouth pain Disposition: [] ED /[] Urgent Care (no appt availability in office) / [] Appointment(In office/virtual)/ []  [facility]/ [x] Home Care/ [] Refused Recommended Disposition /[] [place] [facility]/ []  Follow-up with PCP Additional Notes: pt will cll back if worsens       Reason for Disposition  MILD-MODERATE diarrhea (e.g., 1-6 times / day more than normal)  Answer Assessment - Initial Assessment Questions 1. DIARRHEA SEVERITY: "How bad is the diarrhea?" "How many more stools have you had in the past 24 hours than normal?"    - NO DIARRHEA (SCALE 0)   - MILD (SCALE 1-3): Few loose or mushy BMs; increase of 1-3 stools over normal daily number of stools; mild increase in ostomy output.   -  MODERATE (SCALE 4-7): Increase of 4-6 stools daily over normal; moderate increase in ostomy output. * SEVERE (SCALE 8-10; OR 'WORST POSSIBLE'): Increase of 7 or more stools daily over normal; moderate increase in ostomy output; incontinence.     4 times today 2. ONSET: "When did the diarrhea begin?"      yesterday 3. BM CONSISTENCY: "How loose or watery is the diarrhea?"      water 4. VOMITING: "Are you also vomiting?" If Yes, ask: "How many times in the past 24 hours?"      Yes- 4 times 5. ABDOMINAL PAIN: "Are you having any abdominal pain?" If Yes, ask: "What does it feel like?" (e.g., crampy, dull, intermittent, constant)      no 6. ABDOMINAL PAIN SEVERITY: If present, ask: "How bad is the pain?"  (e.g., Scale 1-10; mild, moderate, or severe)   - MILD (1-3): doesn't interfere with normal activities, abdomen soft and not tender to touch    - MODERATE (4-7): interferes with normal activities or awakens from sleep, abdomen tender to touch    - SEVERE (8-10): excruciating pain, doubled over, unable to do any normal activities        N/a 7. ORAL INTAKE: If vomiting, "Have you been able to drink liquids?" "How much liquids have you had in the past 24 hours?"     Yes-60 oz water 8. HYDRATION: "Any signs of dehydration?" (e.g., dry mouth [not just dry lips], too weak to stand, dizziness, new weight loss) "When did you last urinate?"     Mouth dry, 10 minutes 9. 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 ANTIBIOTIC USE: "Are you taking antibiotics now or have you taken antibiotics in the past 2 months?"       no 11. OTHER SYMPTOMS: "Do you have any other symptoms?" (e.g., fever, blood in stool)       no  Protocols used: Diarrhea-A-AH

## 2022-01-22 ENCOUNTER — Emergency Department
Admission: EM | Admit: 2022-01-22 | Discharge: 2022-01-22 | Disposition: A | Discharge status: Home / Self Care | Payer: Medicaid Other | Attending: Emergency Medicine | Admitting: Emergency Medicine

## 2022-01-22 ENCOUNTER — Emergency Department: Payer: Medicaid Other

## 2022-01-22 ENCOUNTER — Encounter: Payer: Self-pay | Admitting: Emergency Medicine

## 2022-01-22 ENCOUNTER — Other Ambulatory Visit: Payer: Self-pay

## 2022-01-22 DIAGNOSIS — G40909 Epilepsy, unspecified, not intractable, without status epilepticus: Secondary | ICD-10-CM | POA: Diagnosis not present

## 2022-01-22 DIAGNOSIS — I10 Essential (primary) hypertension: Secondary | ICD-10-CM | POA: Insufficient documentation

## 2022-01-22 DIAGNOSIS — R569 Unspecified convulsions: Secondary | ICD-10-CM | POA: Diagnosis present

## 2022-01-22 DIAGNOSIS — G40409 Other generalized epilepsy and epileptic syndromes, not intractable, without status epilepticus: Secondary | ICD-10-CM

## 2022-01-22 LAB — COMPREHENSIVE METABOLIC PANEL
ALT: 16 U/L (ref 0–44)
AST: 18 U/L (ref 15–41)
Albumin: 3.9 g/dL (ref 3.5–5.0)
Alkaline Phosphatase: 68 U/L (ref 38–126)
Anion gap: 8 (ref 5–15)
BUN: 24 mg/dL — ABNORMAL HIGH (ref 6–20)
CO2: 26 mmol/L (ref 22–32)
Calcium: 9.1 mg/dL (ref 8.9–10.3)
Chloride: 107 mmol/L (ref 98–111)
Creatinine, Ser: 1.13 mg/dL — ABNORMAL HIGH (ref 0.44–1.00)
GFR, Estimated: >60 mL/min (ref >60)
Glucose, Bld: 116 mg/dL — ABNORMAL HIGH (ref 70–99)
Potassium: 3.4 mmol/L — ABNORMAL LOW (ref 3.5–5.1)
Sodium: 141 mmol/L (ref 135–145)
Total Bilirubin: 0.6 mg/dL (ref 0.3–1.2)
Total Protein: 7.6 g/dL (ref 6.5–8.1)

## 2022-01-22 LAB — CBC WITH DIFFERENTIAL/PLATELET
Abs Immature Granulocytes: 0.02 10*3/uL (ref 0.00–0.07)
Basophils Absolute: 0 10*3/uL (ref 0.0–0.1)
Basophils Relative: 0 %
Eosinophils Absolute: 0 10*3/uL (ref 0.0–0.5)
Eosinophils Relative: 0 %
HCT: 43.6 % (ref 36.0–46.0)
Hemoglobin: 14.3 g/dL (ref 12.0–15.0)
Immature Granulocytes: 0 %
Lymphocytes Relative: 32 %
Lymphs Abs: 3.1 10*3/uL (ref 0.7–4.0)
MCH: 29.4 pg (ref 26.0–34.0)
MCHC: 32.8 g/dL (ref 30.0–36.0)
MCV: 89.5 fL (ref 80.0–100.0)
Monocytes Absolute: 0.5 10*3/uL (ref 0.1–1.0)
Monocytes Relative: 5 %
Neutro Abs: 5.8 10*3/uL (ref 1.7–7.7)
Neutrophils Relative %: 63 %
Platelets: 234 10*3/uL (ref 150–400)
RBC: 4.87 MIL/uL (ref 3.87–5.11)
RDW: 15.3 % (ref 11.5–15.5)
WBC: 9.5 10*3/uL (ref 4.0–10.5)
nRBC: 0 % (ref 0.0–0.2)

## 2022-01-22 LAB — PHENYTOIN LEVEL, TOTAL: Phenytoin Lvl: <2.5 ug/mL — ABNORMAL LOW (ref 10.0–20.0)

## 2022-01-22 LAB — TROPONIN I (HIGH SENSITIVITY): Troponin I (High Sensitivity): 7 ng/L (ref <18)

## 2022-01-22 MED ORDER — LEVETIRACETAM 500 MG PO TABS: 500.0000 mg | ORAL_TABLET | Freq: Two times a day (BID) | ORAL | 1 refills | Status: AC

## 2022-01-22 MED ORDER — PHENYTOIN SODIUM EXTENDED 100 MG PO CAPS: 300.0000 mg | ORAL_CAPSULE | Freq: Two times a day (BID) | ORAL | 3 refills | Status: AC

## 2022-01-22 MED ORDER — SODIUM CHLORIDE 0.9 % IV BOLUS
1000.0000 mL | Freq: Once | INTRAVENOUS | Status: AC
Start: 2022-01-22 — End: 2022-01-22
  Administered 2022-01-22: 1000 mL via INTRAVENOUS

## 2022-01-22 MED ORDER — LEVETIRACETAM IN NACL 500 MG/100ML IV SOLN
500.0000 mg | Freq: Once | INTRAVENOUS | Status: AC
  Administered 2022-01-22: 500 mg via INTRAVENOUS
  Filled 2022-01-22: qty 100

## 2022-01-22 MED ORDER — ACETAMINOPHEN 500 MG PO TABS
1000.0000 mg | ORAL_TABLET | Freq: Once | ORAL | Status: AC
  Administered 2022-01-22: 1000 mg via ORAL
  Filled 2022-01-22: qty 2

## 2022-01-22 NOTE — Discharge Instructions (Addendum)
-  Please continue taking your antiseizure medications as originally prescribed.  Follow-up with your neurologist as discussed.  -Return to the emergency department anytime if you begin to experience any new or worsening symptoms.

## 2022-01-22 NOTE — ED Provider Notes (Signed)
[facility] Provider Note    Event Date/Time   First MD Initiated Contact with Patient 01/22/22 [phone]     (approximate)   History   Chief Complaint Seizures   HPI Kristy Kirby is a 46 y.o. female, history of seizure disorder (on phenytoin and levetiracetam), hypertension, insomnia, anxiety, presents to the emergency department for evaluation of seizure.  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  She states that she felt some chest discomfort and shortness of breath.  She went to sit in a chair, and that is the last thing that she remembers.  Per witnesses, she reportedly fell out of the chair and had 2 seizures, each approximately 1 minute in duration.  She states that she woke up on the floor and had a mild headache with neck pain.  She states that she has been taking her phenytoin as previously as prescribed, but she has not taken her levetiracetam for several weeks because she has ran out.  Denies fever/chills, chest pain, shortness of breath, vision change, hearing changes, abdominal pain, flank pain, nausea/vomiting, dizziness/lightheadedness, restless lesions, or numbness/tingling in upper or lower extremities.  History Limitations: No limitations.        Physical Exam  Triage Vital Signs: ED Triage Vitals  Enc Vitals Group     BP 01/22/22 0917 (!) 167/99     Pulse Rate 01/22/22 0917 (!) 112     Resp 01/22/22 0917 20     Temp 01/22/22 0917 98.3 F (36.8 C)     Temp Source 01/22/22 0917 Oral     SpO2 01/22/22 0917 100 %     Weight 01/22/22 0918 240 lb (108.9 kg)     Height 01/22/22 0918 5\' 2"  (1.575 m)     Head Circumference --      Peak Flow --      Pain Score 01/22/22 0918 5     Pain Loc --      Pain Edu? --      Excl. in [place]? --     Most recent vital signs: Vitals:   01/22/22 0917  BP: (!) 167/99  Pulse: (!) 112  Resp: 20  Temp: 98.3 F (36.8 C)  SpO2: 100%     General: Awake, NAD.  Skin: Warm, dry. No rashes or lesions.  Eyes: PERRL. Conjunctivae normal.  CV: Good peripheral perfusion.  Resp: Normal effort.  Abd: Soft, non-tender. No distention.  Neuro: At baseline. No gross neurological deficits.  Cranial nerves II through XII intact.  5/5 strength and sensation in both upper and lower extremities. Musculoskeletal: Normal ROM of all extremities.  Focused Exam: Mild midline cervical spine tenderness.  Head is normocephalic, atraumatic.  No abrasions, lacerations, or ecchymosis.  Physical Exam    ED Results / Procedures / Treatments  Labs (all labs ordered are listed, but only abnormal results are displayed) Labs Reviewed  COMPREHENSIVE METABOLIC PANEL - Abnormal; Notable for the following components:      Result Value   Potassium 3.4 (*)    Glucose, Bld 116 (*)    BUN 24 (*)    Creatinine, Ser 1.13 (*)    All other components within normal limits  PHENYTOIN LEVEL, TOTAL - Abnormal; Notable for the following components:   Phenytoin Lvl <2.5 (*)    All other components within normal limits  CBC WITH DIFFERENTIAL/PLATELET  LEVETIRACETAM LEVEL  TROPONIN I (HIGH SENSITIVITY)  TROPONIN I (HIGH SENSITIVITY)     EKG Sinus rhythm, rate of 96, no significant ST segment changes, no axis deviations, normal QRS interval    RADIOLOGY  ED Provider Interpretation: I personally viewed and interpreted these images, head CT shows no acute intracranial device.  CT cervical spine shows no acute fractures.  CT Head Wo Contrast  Result Date: 01/22/2022 CLINICAL DATA:  Trauma.  Seizures.  Fall from chair. EXAM: CT HEAD WITHOUT CONTRAST CT CERVICAL SPINE WITHOUT CONTRAST TECHNIQUE: Multidetector CT imaging of the head and cervical spine was performed following the standard protocol without intravenous contrast. Multiplanar CT image reconstructions of the cervical spine were also generated. RADIATION DOSE REDUCTION: This exam was performed  according to the departmental dose-optimization program which includes automated exposure control, adjustment of the mA and/or kV according to patient size and/or use of iterative reconstruction technique. COMPARISON:  None Available. FINDINGS: CT HEAD FINDINGS Brain: Ventricles and sulci are appropriate for patient's age. No evidence for acute cortically based infarct, intracranial hemorrhage, mass lesion or mass-effect. Vascular: No hyperdense vessel or unexpected calcification. Skull: Normal. Negative for fracture or focal lesion. Sinuses/Orbits: No acute finding. Other: None CT CERVICAL SPINE FINDINGS Alignment: Normal anatomic alignment. Skull base and vertebrae: No acute fracture. No primary bone lesion or focal pathologic process. Soft tissues and spinal canal: No prevertebral fluid or swelling. No visible canal hematoma. Disc levels: Anterior endplate osteophytosis D34-534. Degenerative disc disease C5-6. Upper chest: Negative. Other: None IMPRESSION: 1. No acute intracranial process. 2. No acute cervical spine fracture. Degenerative changes. Electronically Signed   By: Lovey Newcomer M.D.   On: 01/22/2022 10:42   CT Cervical Spine Wo Contrast  Result Date: 01/22/2022 CLINICAL DATA:  Trauma.  Seizures.  Fall from chair. EXAM: CT HEAD WITHOUT CONTRAST CT CERVICAL SPINE WITHOUT CONTRAST TECHNIQUE: Multidetector CT imaging of the head and cervical spine was performed following the standard protocol without intravenous contrast. Multiplanar CT image reconstructions of the cervical spine were also generated. RADIATION DOSE REDUCTION: This exam was performed according to the departmental dose-optimization program which includes automated exposure control, adjustment of the mA and/or kV according to patient size and/or use of iterative reconstruction technique. COMPARISON:  None Available. FINDINGS: CT HEAD FINDINGS Brain: Ventricles and sulci are appropriate for patient's age. No evidence for acute cortically based  infarct, intracranial hemorrhage, mass lesion or mass-effect. Vascular: No hyperdense vessel or unexpected calcification. Skull: Normal. Negative for fracture or focal lesion. Sinuses/Orbits: No acute finding. Other: None CT CERVICAL SPINE FINDINGS Alignment: Normal anatomic alignment. Skull base and vertebrae: No acute fracture. No primary bone lesion or focal pathologic process. Soft tissues and spinal canal: No prevertebral fluid or swelling. No visible canal hematoma. Disc levels: Anterior endplate osteophytosis [id number]. Degenerative disc disease C5-6. Upper chest: Negative. Other: None IMPRESSION: 1. No acute intracranial process. 2. No acute cervical spine fracture. Degenerative changes. Electronically Signed   By: Lovey Newcomer M.D.   On: 01/22/2022 10:42   DG Chest 2 View  Result Date: 01/22/2022 CLINICAL DATA:  Chest pain, 2 seizures at work, fell from chair EXAM: CHEST - 2 VIEW COMPARISON:.: COMPARISON:. 06/03/2020 FINDINGS: Upper normal heart size. Mediastinal contours and pulmonary vascularity normal. Lungs clear. No infiltrate, pleural effusion, or pneumothorax. No acute osseous findings. IMPRESSION: No acute abnormalities Electronically Signed   By: Lavonia Dana M.D.   On: 01/22/2022 10:15    PROCEDURES:  Critical Care performed: N/A.  Procedures    MEDICATIONS ORDERED IN ED: Medications  levETIRAcetam (KEPPRA)  IVPB 500 mg/100 mL premix (0 mg Intravenous Stopped 01/22/22 1011)  sodium chloride 0.9 % bolus 1,000 mL (0 mLs Intravenous Stopped 01/22/22 1200)  acetaminophen (TYLENOL) tablet 1,000 mg (1,000 mg Oral Given 01/22/22 1115)     IMPRESSION / MDM / ASSESSMENT AND PLAN / ED COURSE  I reviewed the triage vital signs and the nursing notes.                              Differential diagnosis includes, but is not limited to, seizures, ACS, anxiety, concussion, intracranial hemorrhage, cervical spine fracture.  ED Course Patient appears well, tachycardic at 112, otherwise normal  vitals.  NAD.  Currently not endorsing any symptoms at this time.  CBC has no leukocytosis or anemia.  CMP shows mildly elevated creatinine 1.13, otherwise no significant electrolyte abnormalities or transaminitis.  Will treat with IV fluids.  Initial troponin 7, consistent with her baseline.  Assessment/Plan Patient presents with fall/seizures.  Physical exam is overall unremarkable.  No signs of any neurological deficits or significant head/neck injuries.  CT head/neck is reassuring, no evidence of acute fractures or intracranial bleeding.  Lab workup is unremarkable.  Treated here with IV fluids, Tylenol, and Keppra.  I suspect that her seizures today are likely due to the fact that she has been out of her Keppra for the past several weeks.  Will provide her with a refill of both phenytoin and Keppra so she can initiate again.  Advised her to follow-up with her neurologist.  In regards to her proceeding symptoms, she is not having any active symptoms at this time.  Lab workup so far has been reassuring.  EKG is unremarkable.  Low suspicion for any serious or life-threatening pathology.  Patient states that she is ready to go home now.  Recommend that she follow-up with her primary care provider within the next few days.  Will discharge.  Considered admission for this patient, but given her stable presentation and unremarkable workup, she is unlikely to benefit from admission.  Provided the patient with anticipatory guidance, return precautions, and educational material. Encouraged the patient to return to the emergency department at any time if they begin to experience any new or worsening symptoms. Patient expressed understanding and agreed with the plan.   Patient's presentation is most consistent with acute presentation with potential threat to life or bodily function.       FINAL CLINICAL IMPRESSION(S) / ED DIAGNOSES   Final diagnoses:  Seizure (HCC)     Rx / DC Orders   ED  Discharge Orders          Ordered    levETIRAcetam (KEPPRA) 500 MG tablet  2 times daily        01/22/22 1218    phenytoin (DILANTIN) 100 MG ER capsule  2 times daily        01/22/22 1218             Note:  This document was prepared using Dragon voice recognition software and may include unintentional dictation errors.   Varney Daily, [place] 01/22/22 1531    Minna Antis, MD 01/25/22 1118

## 2022-01-22 NOTE — ED Notes (Signed)
Patient transported to X-ray 

## 2022-01-22 NOTE — ED Triage Notes (Signed)
Presents via EMS from work  Per staff members she had 2 sz's at work  fell from [employer], [employer] party each sz lasted approx 1 min   having pain to back of head [place]

## 2022-01-25 LAB — LEVETIRACETAM LEVEL: Levetiracetam Lvl: <2 ug/mL — ABNORMAL LOW (ref 10.0–40.0)

## 2022-02-01 ENCOUNTER — Emergency Department
Admission: EM | Admit: 2022-02-01 | Discharge: 2022-02-01 | Discharge status: Against Medical Advice | Payer: Medicaid Other

## 2022-03-14 ENCOUNTER — Emergency Department
Admission: EM | Admit: 2022-03-14 | Discharge: 2022-03-14 | Disposition: A | Discharge status: Home / Self Care | Payer: Managed Care, Other (non HMO) | Attending: Emergency Medicine | Admitting: Emergency Medicine

## 2022-03-14 ENCOUNTER — Emergency Department: Payer: Managed Care, Other (non HMO)

## 2022-03-14 ENCOUNTER — Other Ambulatory Visit: Payer: Self-pay

## 2022-03-14 DIAGNOSIS — I10 Essential (primary) hypertension: Secondary | ICD-10-CM | POA: Diagnosis not present

## 2022-03-14 DIAGNOSIS — M5441 Lumbago with sciatica, right side: Secondary | ICD-10-CM | POA: Diagnosis not present

## 2022-03-14 DIAGNOSIS — M79604 Pain in right leg: Secondary | ICD-10-CM | POA: Diagnosis present

## 2022-03-14 DIAGNOSIS — M5440 Lumbago with sciatica, unspecified side: Secondary | ICD-10-CM

## 2022-03-14 MED ORDER — BACLOFEN 10 MG PO TABS: 10.0000 mg | ORAL_TABLET | Freq: Three times a day (TID) | ORAL | 0 refills | Status: AC

## 2022-03-14 MED ORDER — HYDROCODONE-ACETAMINOPHEN 5-325 MG PO TABS: 1.0000 | ORAL_TABLET | Freq: Four times a day (QID) | ORAL | 0 refills | Status: AC | PRN

## 2022-03-14 MED ORDER — PREDNISONE 10 MG (21) PO TBPK: ORAL_TABLET | ORAL | 0 refills | Status: DC

## 2022-03-14 MED ORDER — OXYCODONE-ACETAMINOPHEN 5-325 MG PO TABS
1.0000 | ORAL_TABLET | Freq: Once | ORAL | Status: AC
  Administered 2022-03-14: 1 via ORAL
  Filled 2022-03-14: qty 1

## 2022-03-14 NOTE — ED Triage Notes (Signed)
Pt via POV from home. Pt c/o R sided leg pain. Pt states it started a month ago and states she was seen at [facility] approx a month ago and got an US done for possible blood clots. States it was negative. Pt says this past week the pain has gotten significantly worse. Pt has hx of of sciatica and states the pain did originate in her lower back. Pt is ambulatory. Pt is A&Ox4 and NAD

## 2022-03-14 NOTE — ED Provider Notes (Signed)
  Physical Exam  BP (!) 159/102   Pulse 76   Temp 97.9 F (36.6 C) (Oral)   Resp 16   Ht 5\' 2"  (1.575 m)   Wt 117.9 kg   LMP 11/14/2019   SpO2 94%   BMI 47.55 kg/m   Physical Exam  Procedures  Procedures  ED Course / MDM    Medical Decision Making Amount and/or Complexity of Data Reviewed Radiology: ordered.   Assuming care from Letitia Neri, PA-C.  Patient was concerned of a DVT in the right leg.  Previous x-ray of the lumbar spine was negative and was reviewed by previous PA.  Ultrasound of the right lower extremity independently reviewed and interpreted by me as being negative for DVT.  Patient will be started on a steroid pack, muscle relaxer given pain medication.  She is in agreement treatment plan.  Discharged stable condition.       Versie Starks, PA-C 03/14/22 1200    Blake Divine, MD 03/14/22 1450

## 2022-03-14 NOTE — ED Notes (Signed)
See triage note. Pt walked from lobby to room 54. Ongoing R leg pain since 1 month with u/s negative for DVT at [facility] 1 month ago. Pt in NAD.

## 2022-03-14 NOTE — ED Provider Notes (Signed)
[facility] Provider Note    Event Date/Time   First MD Initiated Contact with Patient 03/14/22 [phone]     (approximate)   History   Leg Pain   HPI  Kristy Kirby is a 47 y.o. female presents to the ED with complaint of low back pain with radiation into her right leg.  Patient reports that this is somewhat different than her usual sciatica and that she cannot get any relief even lying in bed.  She denies any recent injury to her back.  Patient states that she has been sitting around more often than usual as she is not working at this time.  She was seen in [facility] approximately 1 month ago at that time a ultrasound of her right lower extremity was negative for DVT.  Patient has a history of hypertension but has not taken her blood pressure medication today.  She also has history of [place] seizure disorder, depression, neuropathy, depression with associated anxiety.     Physical Exam   Triage Vital Signs: ED Triage Vitals  Enc Vitals Group     BP 03/14/22 0843 (!) 180/107     Pulse Rate 03/14/22 0843 86     Resp 03/14/22 0843 20     Temp 03/14/22 0843 98.4 F (36.9 C)     Temp Source 03/14/22 0843 Oral     SpO2 03/14/22 0843 98 %     Weight 03/14/22 0840 260 lb (117.9 kg)     Height 03/14/22 0840 5\' 2"  (1.575 m)     Head Circumference --      Peak Flow --      Pain Score 03/14/22 0840 10     Pain Loc --      Pain Edu? --      Excl. in [place]? --     Most recent vital signs: Vitals:   03/14/22 0843  BP: (!) 180/107  Pulse: 86  Resp: 20  Temp: 98.4 F (36.9 C)  SpO2: 98%     General: Awake, no distress.  CV:  Good peripheral perfusion.  Resp:  Normal effort.  Abd:  No distention.  Soft, nontender. Other:  Diffuse tenderness on palpation of the low lumbar spine.  No point tenderness on palpation of the thoracic or upper lumbar spine.  Straight leg raises approximately 20 degrees right lower extremity.  Good muscle strength at  5/5.   ED Results / Procedures / Treatments   Labs (all labs ordered are listed, but only abnormal results are displayed) Labs Reviewed - No data to display   RADIOLOGY Lumbar spine x-ray images were reviewed by myself independent of the radiologist and was negative for compression fracture.  Radiology report is negative.    PROCEDURES:  Critical Care performed:   Procedures   MEDICATIONS ORDERED IN ED: Medications - No data to display   IMPRESSION / MDM / [place] / ED COURSE  I reviewed the triage vital signs and the nursing notes.   Differential diagnosis includes, but is not limited to, low back pain with right sciatica, right lower extremity pain rule out DVT, chronic back pain, hypertension uncontrolled, medically noncompliant.  47 year old female presents to the ED with complaint of low back pain with radiation right lower extremity that she explains is different than her regular sciatica.  Patient denies any injury to her back or any changes.  Patient was sent for lumbar spine x-rays which were negative.  Ultrasound was pending at  this time. Patient care is being turned over to Ashok Cordia, PA-C who is taking over at this time.     Patient's presentation is most consistent with acute illness / injury with system symptoms.  FINAL CLINICAL IMPRESSION(S) / ED DIAGNOSES   Final diagnoses:  Low back pain with sciatica, sciatica laterality unspecified, unspecified back pain laterality, unspecified chronicity     Rx / DC Orders   ED Discharge Orders     None        Note:  This document was prepared using Dragon voice recognition software and may include unintentional dictation errors.   Johnn Hai, PA-C 03/14/22 1127    Blake Divine, MD 03/14/22 1450

## 2022-03-14 NOTE — ED Notes (Signed)
Pt states she has a hx of high blood pressure and did not take her medication this morning. States she takes lisinopril, metoprolol, hydrochlorothiazide.

## 2022-05-03 ENCOUNTER — Emergency Department
Admission: EM | Admit: 2022-05-03 | Discharge: 2022-05-03 | Disposition: A | Discharge status: Home / Self Care | Payer: Medicaid Other | Attending: Emergency Medicine | Admitting: Emergency Medicine

## 2022-05-03 ENCOUNTER — Emergency Department: Payer: Medicaid Other

## 2022-05-03 DIAGNOSIS — I10 Essential (primary) hypertension: Secondary | ICD-10-CM | POA: Insufficient documentation

## 2022-05-03 DIAGNOSIS — M25562 Pain in left knee: Secondary | ICD-10-CM | POA: Diagnosis present

## 2022-05-03 DIAGNOSIS — M1712 Unilateral primary osteoarthritis, left knee: Secondary | ICD-10-CM | POA: Diagnosis not present

## 2022-05-03 MED ORDER — MELOXICAM 7.5 MG PO TABS: 7.5000 mg | ORAL_TABLET | Freq: Every day | ORAL | 0 refills | Status: DC

## 2022-05-03 MED ORDER — HYDROCODONE-ACETAMINOPHEN 5-325 MG PO TABS
1.0000 | ORAL_TABLET | Freq: Once | ORAL | Status: AC
  Administered 2022-05-03: 1 via ORAL
  Filled 2022-05-03: qty 1

## 2022-05-03 NOTE — Discharge Instructions (Signed)
Call make an appoint with Dr. Leim Fabry who is on-call for orthopedics.  His contact information is listed on your discharge papers along with his address.  A prescription for an anti-inflammatory for your arthritis was sent to the pharmacy for you to begin taking with food.  Discontinue taking this medication if there is any stomach upset.  Also take your blood pressure medication daily.  If your blood pressure continues to remain elevated your primary care provider may want to change her medication to better control your blood pressure.

## 2022-05-03 NOTE — ED Provider Notes (Signed)
[facility] Provider Note    Event Date/Time   First MD Initiated Contact with Patient 05/03/22 1242     (approximate)   History   Knee Pain   HPI  Kristy Kirby is a 47 y.o. female   presents to the ED with complaint of left knee pain for last 3 days.  Patient denies any recent injury to her knee and reports that she is aware that she has arthritis and has frequent episodes of knee pain.  Patient does not take any medications for arthritis.  She denies any peptic ulcer disease.  Patient reports that she did not take her blood pressure medication today.      Physical Exam   Triage Vital Signs: ED Triage Vitals [05/03/22 1212]  Enc Vitals Group     BP (!) 192/127     Pulse Rate 87     Resp 17     Temp 98.2 F (36.8 C)     Temp Source Oral     SpO2 99 %     Weight      Height      Head Circumference      Peak Flow      Pain Score      Pain Loc      Pain Edu?      Excl. in [place]?     Most recent vital signs: Vitals:   05/03/22 1327 05/03/22 1337  BP: (!) 174/130 (!) 206/128  Pulse:  89  Resp:  20  Temp:    SpO2:  99%     General: Awake, no distress.  CV:  Good peripheral perfusion.  Resp:  Normal effort.  Abd:  No distention.  Other:  Left lower extremity with no gross deformity or effusion present.  No warmth or discoloration of the skin.  Skin is intact.  No pitting edema.  Pulses are present both DP and PT.   ED Results / Procedures / Treatments   Labs (all labs ordered are listed, but only abnormal results are displayed) Labs Reviewed - No data to display   RADIOLOGY Left knee x-ray images were reviewed and interpreted by myself independent of the radiologist is negative for acute bony injury.  Radiology report shows degenerative changes with bone spurs present as noted.    PROCEDURES:  Critical Care performed:   Procedures   MEDICATIONS ORDERED IN ED: Medications  HYDROcodone-acetaminophen (NORCO/VICODIN) 5-325  MG per tablet 1 tablet (1 tablet Oral Given 05/03/22 1323)     IMPRESSION / MDM / ASSESSMENT AND PLAN / ED COURSE  I reviewed the triage vital signs and the nursing notes.   Differential diagnosis includes, but is not limited to, left knee pain, left knee strain/sprain, degenerative arthritis, gouty arthritis.  47 year old female presents to the ED with complaint of left knee pain without history of injury.  Patient states that the pain is increased with walking for the last 3 days.  Patient states she has frequent episodes of knee pain but does not take any arthritis medication.  X-rays confirmed that she does have degenerative changes and this was discussed with her.  She is willing to try a anti-inflammatories she does not have any GI issues.  She was warned that should she develop any stomach issues she is to discontinue taking the medication immediately and make sure that she takes this medication with food.  She also will make arrangements to follow-up with Dr. Leim Fabry who is on-call for  orthopedics to evaluate her continued knee pain.      Patient's presentation is most consistent with acute complicated illness / injury requiring diagnostic workup.  FINAL CLINICAL IMPRESSION(S) / ED DIAGNOSES   Final diagnoses:  Osteoarthritis of left knee, unspecified osteoarthritis type  Elevated blood pressure reading with diagnosis of hypertension     Rx / DC Orders   ED Discharge Orders          Ordered    meloxicam (MOBIC) 7.5 MG tablet  Daily        05/03/22 1320             Note:  This document was prepared using Dragon voice recognition software and may include unintentional dictation errors.   Johnn Hai, PA-C 05/03/22 1411    Lavonia Drafts, MD 05/03/22 1447

## 2022-05-03 NOTE — ED Triage Notes (Signed)
Pt co of left knee pain x 3 days. Pt denies recent injuries. Pt states she has arthritis and has frequent episodes of knee pain. Pt states she had a right knee US done in Feb that was negative for blood clots. Pt denies swelling.

## 2022-05-04 ENCOUNTER — Other Ambulatory Visit: Payer: Self-pay

## 2022-05-04 ENCOUNTER — Encounter (HOSPITAL_COMMUNITY): Payer: Self-pay

## 2022-05-04 ENCOUNTER — Emergency Department (HOSPITAL_COMMUNITY)
Admission: EM | Admit: 2022-05-04 | Discharge: 2022-05-05 | Discharge status: Against Medical Advice | Payer: Medicaid Other | Attending: Student | Admitting: Student

## 2022-05-04 DIAGNOSIS — Z5321 Procedure and treatment not carried out due to patient leaving prior to being seen by health care provider: Secondary | ICD-10-CM | POA: Diagnosis not present

## 2022-05-04 DIAGNOSIS — M25562 Pain in left knee: Secondary | ICD-10-CM | POA: Diagnosis present

## 2022-05-04 DIAGNOSIS — M1712 Unilateral primary osteoarthritis, left knee: Secondary | ICD-10-CM | POA: Diagnosis not present

## 2022-05-04 NOTE — ED Notes (Signed)
PA notified that patient decided to leave.

## 2022-05-04 NOTE — ED Provider Notes (Signed)
Patient wants to leave against medical advice from triage. Patient understands that his/her actions will lead to inadequate medical workup, and that he/she is at risk of complications of missed diagnosis, which includes morbidity and mortality.  Alternative options discussed such as waiting to be evaluated in the back for her left knee and elevated blood pressure Opportunity to change mind given  Discussion witnessed by Burnice Logan, RN Patient is demonstrating good capacity to make decision. Patient understands that he/she needs to return to the ER immediately if his/her symptoms get worse.  I spoke with the patient that if she was going to leave before seeing a full workup to please continue taking the meloxicam along with icing her knee and elevating her knee to decrease fluid as described by [place] yesterday.  I encouraged patient to follow-up with the orthopedist [place] gave her and please return if symptoms worsen.   Elvina Sidle 05/04/22 1919    Teressa Lower, MD 05/05/22 1130

## 2022-05-04 NOTE — ED Triage Notes (Signed)
Pt c/o pain behind left kneex2d. Pt states she can't sleep due to the pain.

## 2022-05-04 NOTE — ED Provider Triage Note (Signed)
Emergency Medicine Provider Triage Evaluation Note  Kristy Kirby , a 47 y.o. female  was evaluated in triage.  Pt complains of left knee pain.  Patient dates she has chronic left knee issues and went to [place] yesterday and diagnosed with arthritis in her left knee.  Patient was given meloxicam which she is taking and has been icing her knee since yesterday but still endorsed left knee pain.  Patient states her symptoms today are the same as yesterday.  Patient she thinks there is fluid in the back of her knee.  Patient states she is able to bear weight however is extremely painful that she cannot sleep at night.  Patient denied fevers/chills, nausea/vomiting, change in sensation, overlying skin color changes  Review of Systems  Positive: See HPI Negative: See HPI  Physical Exam  LMP 11/14/2019  Gen:   Awake, no distress   Resp:  Normal effort  MSK:   Decreased leg extension strength as compared to right side, left knee effusion however left knee is not warm to the touch, sensation intact distally along with good pulses and motor skills Other:  NA  Medical Decision Making  Medically screening exam initiated at 6:49 PM.  Appropriate orders placed.  Kristy Kirby was informed that the remainder of the evaluation will be completed by another provider, this initial triage assessment does not replace that evaluation, and the importance of remaining in the ED until their evaluation is complete.  Patient evaluated, suspect this is most likely patient's osteoarthritis patient will be discharged with her orthopedic follow-up that she received at [facility] along with continuation of supportive measures, patient's blood pressure in the room was elevated at 181/100 and so patient will be seen in the back to be evaluated   Chuck Hint, PA-C 05/04/22 1858

## 2022-05-14 ENCOUNTER — Encounter: Payer: Self-pay | Admitting: Intensive Care

## 2022-05-14 ENCOUNTER — Emergency Department: Payer: Medicaid Other

## 2022-05-14 ENCOUNTER — Other Ambulatory Visit: Payer: Self-pay

## 2022-05-14 ENCOUNTER — Emergency Department
Admission: EM | Admit: 2022-05-14 | Discharge: 2022-05-14 | Disposition: A | Discharge status: Home / Self Care | Payer: Medicaid Other | Attending: Emergency Medicine | Admitting: Emergency Medicine

## 2022-05-14 DIAGNOSIS — I10 Essential (primary) hypertension: Secondary | ICD-10-CM | POA: Diagnosis not present

## 2022-05-14 DIAGNOSIS — R197 Diarrhea, unspecified: Secondary | ICD-10-CM | POA: Insufficient documentation

## 2022-05-14 DIAGNOSIS — R112 Nausea with vomiting, unspecified: Secondary | ICD-10-CM | POA: Diagnosis not present

## 2022-05-14 DIAGNOSIS — R200 Anesthesia of skin: Secondary | ICD-10-CM

## 2022-05-14 DIAGNOSIS — M791 Myalgia, unspecified site: Secondary | ICD-10-CM | POA: Diagnosis not present

## 2022-05-14 LAB — COMPREHENSIVE METABOLIC PANEL
ALT: 17 U/L (ref 0–44)
AST: 21 U/L (ref 15–41)
Albumin: 3.7 g/dL (ref 3.5–5.0)
Alkaline Phosphatase: 76 U/L (ref 38–126)
Anion gap: 12 (ref 5–15)
BUN: 11 mg/dL (ref 6–20)
CO2: 26 mmol/L (ref 22–32)
Calcium: 9 mg/dL (ref 8.9–10.3)
Chloride: 99 mmol/L (ref 98–111)
Creatinine, Ser: 0.97 mg/dL (ref 0.44–1.00)
GFR, Estimated: >60 mL/min (ref >60)
Glucose, Bld: 123 mg/dL — ABNORMAL HIGH (ref 70–99)
Potassium: 3.7 mmol/L (ref 3.5–5.1)
Sodium: 137 mmol/L (ref 135–145)
Total Bilirubin: 1.1 mg/dL (ref 0.3–1.2)
Total Protein: 7.8 g/dL (ref 6.5–8.1)

## 2022-05-14 LAB — CBC
HCT: 44.9 % (ref 36.0–46.0)
Hemoglobin: 14.6 g/dL (ref 12.0–15.0)
MCH: 29.4 pg (ref 26.0–34.0)
MCHC: 32.5 g/dL (ref 30.0–36.0)
MCV: 90.3 fL (ref 80.0–100.0)
Platelets: 287 10*3/uL (ref 150–400)
RBC: 4.97 MIL/uL (ref 3.87–5.11)
RDW: 14.6 % (ref 11.5–15.5)
WBC: 9.3 10*3/uL (ref 4.0–10.5)
nRBC: 0 % (ref 0.0–0.2)

## 2022-05-14 LAB — CK: Total CK: 176 U/L (ref 38–234)

## 2022-05-14 MED ORDER — ONDANSETRON HCL 4 MG/2ML IJ SOLN
4.0000 mg | Freq: Once | INTRAMUSCULAR | Status: AC
  Administered 2022-05-14: 4 mg via INTRAVENOUS
  Filled 2022-05-14: qty 2

## 2022-05-14 MED ORDER — SODIUM CHLORIDE 0.9 % IV SOLN: Freq: Once | INTRAVENOUS | Status: AC

## 2022-05-14 MED ORDER — HYDROCHLOROTHIAZIDE 25 MG PO TABS
25.0000 mg | ORAL_TABLET | Freq: Once | ORAL | Status: AC
  Administered 2022-05-14: 25 mg via ORAL
  Filled 2022-05-14: qty 1

## 2022-05-14 MED ORDER — METOPROLOL TARTRATE 25 MG PO TABS
25.0000 mg | ORAL_TABLET | Freq: Once | ORAL | Status: AC
  Administered 2022-05-14: 25 mg via ORAL
  Filled 2022-05-14: qty 1

## 2022-05-14 MED ORDER — MORPHINE SULFATE (PF) 4 MG/ML IV SOLN
4.0000 mg | Freq: Once | INTRAVENOUS | Status: AC
  Administered 2022-05-14: 4 mg via INTRAVENOUS
  Filled 2022-05-14: qty 1

## 2022-05-14 MED ORDER — LOSARTAN POTASSIUM 50 MG PO TABS
100.0000 mg | ORAL_TABLET | Freq: Once | ORAL | Status: AC
  Administered 2022-05-14: 100 mg via ORAL
  Filled 2022-05-14: qty 2

## 2022-05-14 NOTE — ED Notes (Signed)
Patient states that she has been unable to keep her blood pressure medication down that she takes BID.

## 2022-05-14 NOTE — ED Provider Notes (Signed)
[facility] Provider Note    Event Date/Time   First MD Initiated Contact with Patient 05/14/22 1619     (approximate)   History   Numbness and Hypertension   HPI  Kristy Kirby is a 47 y.o. female with a history of hypertension who presents with complaints of bilateral leg discomfort as well as some numbness around her right knee.  She reports this started today.  She reports she is ambulating well and without difficulty.  No headache or other neurodeficits.  She reports she had nausea vomiting and diarrhea yesterday which was quite severe and lasted throughout the night.  She has been unable to take her blood pressure medications because of this.  She denies abdominal pain.  No back pain.  No IV drug abuse.  No loss of bladder continence     Physical Exam   Triage Vital Signs: ED Triage Vitals  Enc Vitals Group     BP 05/14/22 1607 (!) 204/138     Pulse Rate 05/14/22 1607 (!) 101     Resp 05/14/22 1607 20     Temp 05/14/22 1607 98.5 F (36.9 C)     Temp Source 05/14/22 1607 Oral     SpO2 05/14/22 1607 99 %     Weight 05/14/22 1608 108.9 kg (240 lb)     Height 05/14/22 1608 1.575 m (5\' 2" )     Head Circumference --      Peak Flow --      Pain Score 05/14/22 1608 10     Pain Loc --      Pain Edu? --      Excl. in [place]? --     Most recent vital signs: Vitals:   05/14/22 1930 05/14/22 2000  BP: (!) 185/105 (!) 189/111  Pulse:    Resp: 15 20  Temp:    SpO2: 94% 94%     General: Awake, no distress.  CV:  Good peripheral perfusion.  Resp:  Normal effort.  Abd:  No distention.  Other:  Normal strength in the lower extremities, no saddle anesthesia, ambulates well and without difficulty   ED Results / Procedures / Treatments   Labs (all labs ordered are listed, but only abnormal results are displayed) Labs Reviewed  COMPREHENSIVE METABOLIC PANEL - Abnormal; Notable for the following components:      Result Value   Glucose, Bld 123  (*)    All other components within normal limits  CBC  CK     EKG  ED ECG REPORT I, Lavonia Drafts, the attending physician, personally viewed and interpreted this ECG.  Date: 05/14/2022  Rhythm: Sinus tachycardia QRS Axis: normal Intervals: normal ST/T Wave abnormalities: normal Narrative Interpretation: no evidence of acute ischemia    RADIOLOGY CT head viewed interpret by me, no acute abnormality    PROCEDURES:  Critical Care performed:   Procedures   MEDICATIONS ORDERED IN ED: Medications  0.9 %  sodium chloride infusion (0 mLs Intravenous Stopped 05/14/22 1806)  morphine (PF) 4 MG/ML injection 4 mg (4 mg Intravenous Given 05/14/22 1701)  ondansetron (ZOFRAN) injection 4 mg (4 mg Intravenous Given 05/14/22 1700)  losartan (COZAAR) tablet 100 mg (100 mg Oral Given 05/14/22 1940)  metoprolol tartrate (LOPRESSOR) tablet 25 mg (25 mg Oral Given 05/14/22 1939)  hydrochlorothiazide (HYDRODIURIL) tablet 25 mg (25 mg Oral Given 05/14/22 1939)     IMPRESSION / MDM / ASSESSMENT AND PLAN / ED COURSE  I reviewed the triage vital  signs and the nursing notes. Patient's presentation is most consistent with acute presentation with potential threat to life or bodily function.  Patient presents with symptoms as above, differential includes myalgia possibly rhabdomyolysis, electrolyte abnormalities, sciatica, not consistent with CVA  CT head is unremarkable, neuroexam is quite reassuring.  Lab work reviewed and is overall unremarkable, normal CK.  Patient feels better after medication  I recommended obtaining MRI brain given her blood pressure and to rule out CVA however patient refused and said that she would like to be discharged, I did give her home medication prior to discharge, she knows she can return anytime.    Clinical Course as of 05/14/22 2027  Fri May 14, 2022  1656 CK [RK]    Clinical Course User Index [RK] Lavonia Drafts, MD     FINAL CLINICAL IMPRESSION(S) /  ED DIAGNOSES   Final diagnoses:  Primary hypertension  Numbness  Myalgia     Rx / DC Orders   ED Discharge Orders     None        Note:  This document was prepared using Dragon voice recognition software and may include unintentional dictation errors.   Lavonia Drafts, MD 05/14/22 2027

## 2022-05-14 NOTE — ED Triage Notes (Signed)
Patient presents with bilateral leg numbness. Reports emesis and diarrhea all day yesterday.   History hypertension but has not taken her medication today yet

## 2022-07-11 ENCOUNTER — Emergency Department: Payer: Medicaid Other

## 2022-07-11 ENCOUNTER — Emergency Department
Admission: EM | Admit: 2022-07-11 | Discharge: 2022-07-11 | Disposition: A | Discharge status: Home / Self Care | Payer: Medicaid Other | Attending: Emergency Medicine | Admitting: Emergency Medicine

## 2022-07-11 ENCOUNTER — Other Ambulatory Visit: Payer: Self-pay

## 2022-07-11 DIAGNOSIS — M25512 Pain in left shoulder: Secondary | ICD-10-CM | POA: Diagnosis not present

## 2022-07-11 DIAGNOSIS — I10 Essential (primary) hypertension: Secondary | ICD-10-CM | POA: Diagnosis not present

## 2022-07-11 MED ORDER — OXYCODONE HCL 5 MG PO TABS
5.0000 mg | ORAL_TABLET | Freq: Once | ORAL | Status: AC
  Administered 2022-07-11: 5 mg via ORAL
  Filled 2022-07-11: qty 1

## 2022-07-11 MED ORDER — LOSARTAN POTASSIUM 50 MG PO TABS
100.0000 mg | ORAL_TABLET | Freq: Once | ORAL | Status: AC
  Administered 2022-07-11: 100 mg via ORAL
  Filled 2022-07-11: qty 2

## 2022-07-11 MED ORDER — KETOROLAC TROMETHAMINE 15 MG/ML IJ SOLN
15.0000 mg | Freq: Once | INTRAMUSCULAR | Status: AC
  Administered 2022-07-11: 15 mg via INTRAMUSCULAR
  Filled 2022-07-11: qty 1

## 2022-07-11 MED ORDER — ACETAMINOPHEN 500 MG PO TABS
1000.0000 mg | ORAL_TABLET | Freq: Once | ORAL | Status: AC
  Administered 2022-07-11: 1000 mg via ORAL
  Filled 2022-07-11: qty 2

## 2022-07-11 MED ORDER — METOPROLOL TARTRATE 25 MG PO TABS
25.0000 mg | ORAL_TABLET | Freq: Once | ORAL | Status: AC
  Administered 2022-07-11: 25 mg via ORAL
  Filled 2022-07-11: qty 1

## 2022-07-11 MED ORDER — FENTANYL CITRATE PF 50 MCG/ML IJ SOSY
50.0000 ug | PREFILLED_SYRINGE | Freq: Once | INTRAMUSCULAR | Status: AC
  Administered 2022-07-11: 50 ug via INTRAMUSCULAR
  Filled 2022-07-11: qty 1

## 2022-07-11 MED ORDER — HYDROCHLOROTHIAZIDE 25 MG PO TABS
25.0000 mg | ORAL_TABLET | Freq: Every day | ORAL | Status: DC
  Filled 2022-07-11: qty 1

## 2022-07-11 MED ORDER — OXYCODONE HCL 5 MG PO TABS: 5.0000 mg | ORAL_TABLET | Freq: Three times a day (TID) | ORAL | 0 refills | Status: AC | PRN

## 2022-07-11 NOTE — ED Triage Notes (Signed)
Pt c/o L shoulder pain (pt currently icing shoulder) denies any injury and sts pain is worse when she moves her arm.

## 2022-07-11 NOTE — Discharge Instructions (Addendum)
It is important that you follow-up with a primary care doctor (see list of providers below).  If you continue to have significant pain you may need to have an MRI or be referred to physical therapy.  You can wear the sling for 3 days at most but then I would like you to start ranging her shoulder to avoid getting a frozen shoulder.  You can take Tylenol for pain and the oxycodone as needed for breakthrough pain. Please go to the following website to schedule new (and existing) patient appointments:   [email or web address]   The following is a list of primary care offices in the area who are accepting new patients at this time.  Please reach out to one of them directly and let them know you would like to schedule an appointment to follow up on an Emergency Department visit, and/or to establish a new primary care provider (PCP).  There are likely other primary care clinics in the are who are accepting new patients, but this is an excellent place to start:  [facility] Lead physician: Dr Shirlee Latch [address] [place], [place] [address] [phone]  [facility] Lead Physician: Dr Alba Cory [address], [place], [place] [address] [phone]  [facility]  Lead Physician: Dr Olevia Perches [address] [place], [place] [address] [phone]  [facility] Lead Physician: Dr Sofie Hartigan [address] [place], [place] [address] [phone]  [facility] Lead Physician: Dr Bari Edward [address] [place], [place], [place] [address] [phone]

## 2022-07-11 NOTE — ED Provider Notes (Signed)
[facility] Provider Note    Event Date/Time   First MD Initiated Contact with Patient 07/11/22 [phone]     (approximate)   History   Shoulder Pain   HPI  Kristy Kirby is a 47 y.o. female  with pmh HTN, depression, anxiety, RA who p/w shoulder pain.  Patient tells me that she took a nap yesterday and woke up with some pain in the shoulder.  She iced it and used heat.  Woke up this morning with more severe pain.  Pain is primarily in the anterior shoulder and radiates down to her proximal arm.  Does not go down to the the elbow or hand.  Denies numbness or tingling.  Pain is made much worse by abduction.  Denies any chest pain or dyspnea.  No trauma.  Denies history of similar.  Has history of poorly controlled hypertension.  She is on HCTZ losartan and metoprolol.  Did not take her medications this morning     Past Medical History:  Diagnosis Date   Anxiety    Arthritis    Rheumatoid   Depression    HTN (hypertension)    Sciatica    Seizures (HCC)     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   Hypertension 01/03/2012   Insomnia 01/03/2012   Seizure disorder, grand mal (HCC) 01/03/2012   Uterine adnexal pain 01/03/2012     Physical Exam  Triage Vital Signs: ED Triage Vitals  Enc Vitals Group     BP 07/11/22 0627 (!) 222/135     Pulse Rate 07/11/22 0627 (!) 104     Resp 07/11/22 0627 18     Temp 07/11/22 0627 98.3 F (36.8 C)     Temp Source 07/11/22 0627 Oral     SpO2 07/11/22 0627 96 %     Weight 07/11/22 0630 264 lb (119.7 kg)     Height 07/11/22 0630 5\' 2"  (1.575 m)     Head Circumference --      Peak Flow --      Pain Score 07/11/22 0627 10     Pain Loc --      Pain Edu? --      Excl. in [place]? --     Most recent vital signs: Vitals:   07/11/22 0627 07/11/22 0727  BP: (!) 222/135  (!) 189/134  Pulse: (!) 104 82  Resp: 18 20  Temp: 98.3 F (36.8 C)   SpO2: 96% 95%     General: Awake, no distress.  CV:  Good peripheral perfusion.  Resp:  Normal effort.  Abd:  No distention.  Neuro:            Awake, Alert, Oriented x 3  Other:  Patient has mild tenderness over the anterior shoulder, no clavicular tenderness, mild tenderness over the proximal humerus but compartments soft no deformity No tenderness over the elbow forearm wrist or hand Patient has 2+ radial pulse, intact okay sign, thumbs up and finger abduction Patient is able to flex to 90 degrees but with discomfort, good strength with resisted flexion Significant pain brought on by abduction but she is able to abduct to about 70 degrees Pain elicited with abduction and internal rotation   ED Results / Procedures / Treatments  Labs (all labs ordered are listed, but only abnormal results are displayed) Labs  Reviewed - No data to display   EKG     RADIOLOGY I reviewed and interpreted patient's x-ray of the shoulder which is negative for fracture   PROCEDURES:  Critical Care performed: No  Procedures    MEDICATIONS ORDERED IN ED: Medications  hydrochlorothiazide (HYDRODIURIL) tablet 25 mg (has no administration in time range)  losartan (COZAAR) tablet 100 mg (100 mg Oral Given 07/11/22 0734)  metoprolol tartrate (LOPRESSOR) tablet 25 mg (25 mg Oral Given 07/11/22 0734)  acetaminophen (TYLENOL) tablet 1,000 mg (1,000 mg Oral Given 07/11/22 0734)  oxyCODONE (Oxy IR/ROXICODONE) immediate release tablet 5 mg (5 mg Oral Given 07/11/22 0734)  ketorolac (TORADOL) 15 MG/ML injection 15 mg (15 mg Intramuscular Given 07/11/22 0820)  fentaNYL (SUBLIMAZE) injection 50 mcg (50 mcg Intramuscular Given 07/11/22 0819)     IMPRESSION / MDM / ASSESSMENT AND PLAN / ED COURSE  I reviewed the triage vital signs and the nursing notes.                              Patient's presentation is most consistent with  acute, uncomplicated illness.  Differential diagnosis includes, but is not limited to, rotator cuff tear/tendinitis, bursitis, impingement syndrome, osteoarthritis, low suspicion for septic shoulder, compartment syndrome  The patient is a 47 year old female with history of hypertension who presents with shoulder pain.  This started after she took a nap during the day yesterday and worsened overnight.  Pain is primarily in the anterior shoulder and proximal humerus.  Denies numbness tingling or neck pain.  No fevers or chills no history of significant trauma.  She denies history of similar.  She is quite hypertensive on arrival blood pressure 220/135.  Will see that patient had a ED visit about 2 months ago during which her blood pressure was also significantly elevated.  She did not take her metoprolol HCTZ or losartan this morning and given the degree of pain she is and I suspect that this is contributing.  She does look somewhat uncomfortable on exam.  She has tenderness over the proximal humerus but there is no deformity compartments soft no skin changes.  She is able to flex to 90 degrees has significant pain with abduction and abduction and internal rotation consistent with impingement syndrome.  She is however neurovascularly intact.  Suspect either bursitis or rotator cuff pathology.  I have low suspicion for septic joint given she is able to range without pain with micro movements.  Exam not consistent with compartment syndrome or other infectious process.  X-ray was obtained from triage and this is negative for fracture as expected.  Will treat supportively with Tylenol and oxycodone.  Will give her her home BP meds as well given she did not take them.  Patient's BP improving prior to any intervention.  On reassessment after Tylenol and oxycodone she continues to have significant pain.  Will give IM Toradol and fentanyl and attempt to control her pain.    Patient resting comfortably on  reassessment.  Blood pressure still high but is downtrending.  Given low suspicion for life or limb threatening process will discharge.  Recommended she avoid NSAIDs given her elevated blood pressure.  Recommended Tylenol and oxycodone as needed.  I provided her a list of primary doctor she can follow-up with for both her shoulder pain and blood pressure management.  I advised that she may need physical therapy or an MRI down the line if this is not improving.  Discussed return precautions for fever or significant neck pain or weakness in the hand. FINAL CLINICAL IMPRESSION(S) / ED DIAGNOSES   Final diagnoses:  Acute pain of left shoulder     Rx / DC Orders   ED Discharge Orders     None        Note:  This document was prepared using Dragon voice recognition software and may include unintentional dictation errors.   Georga Hacking, MD 07/11/22 [phone]

## 2022-08-26 ENCOUNTER — Ambulatory Visit: Payer: Medicaid Other | Admitting: Physician Assistant

## 2022-08-26 NOTE — Progress Notes (Deleted)
New patient visit  Patient: Kristy Kirby   DOB: 03/11/1975   47 y.o. Female  MRN: [id number] Visit Date: 08/26/2022  Today's healthcare provider: Debera Lat, PA-C   No chief complaint on file.  Subjective    Kristy Kirby is a 47 y.o. female who presents today as a new patient to establish care.  HPI  *** Discussed the use of AI scribe software for clinical note transcription with the patient, who gave verbal consent to proceed.  History of Present Illness            Past Medical History:  Diagnosis Date  . Anxiety   . Arthritis    Rheumatoid  . Depression   . HTN (hypertension)   . Sciatica   . Seizures (HCC)    Past Surgical History:  Procedure Laterality Date  . TUBAL LIGATION     Family Status  Relation Name Status  . Mother  Deceased  . Father  Deceased  . Sister  (Not Specified)  . PGM  (Not Specified)  . PGF  (Not Specified)  No partnership data on file   Family History  Problem Relation Age of Onset  . Hypertension Mother   . Diabetes Mother   . Stroke Mother   . Hypertension Father   . Diabetes Father   . Heart disease Father   . Stroke Father   . Diabetes Sister   . Stroke Paternal Grandmother   . Stroke Paternal Grandfather    Social History   Socioeconomic History  . Marital status: Single    Spouse name: Not on file  . Number of children: Not on file  . Years of education: Not on file  . Highest education level: Not on file  Occupational History  . Not on file  Tobacco Use  . Smoking status: Former    Current packs/day: 0.50    Types: Cigarettes  . Smokeless tobacco: Never  Vaping Use  . Vaping status: Former  Substance and Sexual Activity  . Alcohol use: No  . Drug use: No  . Sexual activity: Yes    Birth control/protection: None  Other Topics Concern  . Not on file  Social History Narrative  . Not on file   Social Determinants of Health   Financial Resource Strain: High Risk (02/03/2022)   Received from [facility], [facility]   Overall Financial Resource Strain (CARDIA)   . Difficulty of Paying Living Expenses: Hard  Food Insecurity: No Food Insecurity (02/03/2022)   Received from [facility], [facility]   Hunger Vital Sign   . Worried About Programme researcher, broadcasting/film/video in the Last Year: Never true   . Ran Out of Food in the Last Year: Never true  Transportation Needs: No Transportation Needs (02/03/2022)   Received from [facility], [facility]   [facility] - Transportation   . Lack of Transportation (Medical): No   . Lack of Transportation (Non-Medical): No  Physical Activity: Not on file  Stress: Not on file  Social Connections: Not on file   Outpatient Medications Prior to Visit  Medication Sig  . albuterol (VENTOLIN HFA) 108 (90 Base) MCG/ACT inhaler Inhale 1-2 puffs into the lungs every 6 (six) hours as needed for wheezing or shortness of breath.  . clonazePAM (KLONOPIN) 0.5 MG tablet Take by mouth.  . escitalopram (LEXAPRO) 10 MG tablet Take by mouth.  Marland Kitchen FLUoxetine (PROZAC) 10 MG capsule Take 1 capsule by mouth  once daily  . hydrochlorothiazide (HYDRODIURIL) 25 MG tablet Take 1 tablet (25 mg total) by mouth daily.  Marland Kitchen HYDROcodone-acetaminophen (NORCO/VICODIN) 5-325 MG tablet Take 1 tablet by mouth every 6 (six) hours as needed for moderate pain.  Marland Kitchen levETIRAcetam (KEPPRA) 500 MG tablet Take 1 tablet (500 mg total) by mouth 2 (two) times daily.  Marland Kitchen lisinopril (ZESTRIL) 10 MG tablet Take by mouth.  . losartan (COZAAR) 100 MG tablet Take by mouth.  . meloxicam (MOBIC) 7.5 MG tablet Take 1 tablet (7.5 mg total) by mouth daily.  . metoprolol tartrate (LOPRESSOR) 25 MG tablet Take 1 tablet (25 mg total) by mouth 2 (two) times daily.  . norethindrone (AYGESTIN) 5 MG tablet Take 1 tablet (5 mg total) by mouth daily for 10 days.  . phenytoin (DILANTIN) 100 MG ER capsule Take 3 capsules (300 mg total) by mouth 2 (two) times daily.  . potassium chloride SA (KLOR-CON M20) 20 MEQ  tablet Take 1 tablet (20 mEq total) by mouth daily.  . predniSONE (STERAPRED UNI-PAK 21 TAB) 10 MG (21) TBPK tablet Take 6 pills on day one then decrease by 1 pill each day  . trazodone (DESYREL) 300 MG tablet Take 300 mg by mouth at bedtime.   No facility-administered medications prior to visit.   Allergies  Allergen Reactions  . Banana   . Coconut Fatty Acids   . Penicillins   . Sulfa Antibiotics Hives    GI upset   . Coconut (Cocos Nucifera) Rash    Immunization History  Administered Date(s) Administered  . Janssen (J&J) SARS-COV-2 Vaccination 08/03/2019  . Tdap 09/16/2018    Health Maintenance  Topic Date Due  . COVID-19 Vaccine (2 - Janssen risk series) 08/31/2019  . Colonoscopy  Never done  . PAP SMEAR-Modifier  09/07/2022  . INFLUENZA VACCINE  09/16/2022  . DTaP/Tdap/Td (2 - Td or Tdap) 09/15/2028  . Hepatitis C Screening  Completed  . HIV Screening  Completed  . HPV VACCINES  Aged Out    Patient Care Team: Pcp, No as PCP - General  Review of Systems Except see HPI   {Labs  Heme  Chem  Endocrine  Serology  Results Review (optional):23779}   Objective    LMP 11/14/2019  {Show previous vital signs (optional):23777}  Physical Exam  Depression Screen    08/22/2019   12:08 PM 08/17/2019    2:13 PM  PHQ 2/9 Scores  PHQ - 2 Score 5 4  PHQ- 9 Score 21 11   No results found for any visits on 08/26/22.  Assessment & Plan     *** Assessment and Plan              Encounter to establish care Welcomed to our clinic Reviewed past medical hx, social hx, family hx and surgical hx Pt advised to send all vaccination records or screening   No follow-ups on file.     [facility]

## 2022-08-31 ENCOUNTER — Observation Stay: Payer: Medicaid Other

## 2022-08-31 ENCOUNTER — Other Ambulatory Visit: Payer: Self-pay

## 2022-08-31 ENCOUNTER — Observation Stay
Admission: EM | Admit: 2022-08-31 | Discharge: 2022-09-01 | Discharge status: Against Medical Advice | Payer: Medicaid Other | Attending: Student in an Organized Health Care Education/Training Program | Admitting: Student in an Organized Health Care Education/Training Program

## 2022-08-31 ENCOUNTER — Encounter: Payer: Self-pay | Admitting: Emergency Medicine

## 2022-08-31 DIAGNOSIS — F17210 Nicotine dependence, cigarettes, uncomplicated: Secondary | ICD-10-CM | POA: Diagnosis not present

## 2022-08-31 DIAGNOSIS — R9431 Abnormal electrocardiogram [ECG] [EKG]: Secondary | ICD-10-CM | POA: Diagnosis present

## 2022-08-31 DIAGNOSIS — I161 Hypertensive emergency: Secondary | ICD-10-CM | POA: Diagnosis not present

## 2022-08-31 DIAGNOSIS — I10 Essential (primary) hypertension: Secondary | ICD-10-CM | POA: Diagnosis not present

## 2022-08-31 DIAGNOSIS — H538 Other visual disturbances: Secondary | ICD-10-CM | POA: Diagnosis present

## 2022-08-31 DIAGNOSIS — G40409 Other generalized epilepsy and epileptic syndromes, not intractable, without status epilepticus: Secondary | ICD-10-CM | POA: Diagnosis not present

## 2022-08-31 DIAGNOSIS — Z5329 Procedure and treatment not carried out because of patient's decision for other reasons: Secondary | ICD-10-CM

## 2022-08-31 DIAGNOSIS — H4310 Vitreous hemorrhage, unspecified eye: Secondary | ICD-10-CM | POA: Diagnosis not present

## 2022-08-31 DIAGNOSIS — F418 Other specified anxiety disorders: Secondary | ICD-10-CM | POA: Diagnosis not present

## 2022-08-31 DIAGNOSIS — Z79899 Other long term (current) drug therapy: Secondary | ICD-10-CM | POA: Diagnosis not present

## 2022-08-31 DIAGNOSIS — E669 Obesity, unspecified: Secondary | ICD-10-CM

## 2022-08-31 DIAGNOSIS — R079 Chest pain, unspecified: Secondary | ICD-10-CM | POA: Diagnosis present

## 2022-08-31 DIAGNOSIS — Z720 Tobacco use: Secondary | ICD-10-CM | POA: Diagnosis present

## 2022-08-31 LAB — LIPID PANEL
Cholesterol: 237 mg/dL — ABNORMAL HIGH (ref 0–200)
HDL: 52 mg/dL (ref >40)
LDL Cholesterol: 140 mg/dL — ABNORMAL HIGH (ref 0–99)
Total CHOL/HDL Ratio: 4.6 RATIO
Triglycerides: 223 mg/dL — ABNORMAL HIGH (ref <150)
VLDL: 45 mg/dL — ABNORMAL HIGH (ref 0–40)

## 2022-08-31 LAB — CBC
HCT: 47.2 % — ABNORMAL HIGH (ref 36.0–46.0)
Hemoglobin: 15.2 g/dL — ABNORMAL HIGH (ref 12.0–15.0)
MCH: 28.9 pg (ref 26.0–34.0)
MCHC: 32.2 g/dL (ref 30.0–36.0)
MCV: 89.7 fL (ref 80.0–100.0)
Platelets: 228 10*3/uL (ref 150–400)
RBC: 5.26 MIL/uL — ABNORMAL HIGH (ref 3.87–5.11)
RDW: 16.2 % — ABNORMAL HIGH (ref 11.5–15.5)
WBC: 10.6 10*3/uL — ABNORMAL HIGH (ref 4.0–10.5)
nRBC: 0 % (ref 0.0–0.2)

## 2022-08-31 LAB — URINE DRUG SCREEN, QUALITATIVE (ARMC ONLY)
Amphetamines, Ur Screen: NOT DETECTED
Barbiturates, Ur Screen: NOT DETECTED
Benzodiazepine, Ur Scrn: NOT DETECTED
Cannabinoid 50 Ng, Ur ~~LOC~~: NOT DETECTED
Cocaine Metabolite,Ur ~~LOC~~: NOT DETECTED
MDMA (Ecstasy)Ur Screen: NOT DETECTED
Methadone Scn, Ur: NOT DETECTED
Opiate, Ur Screen: NOT DETECTED
Phencyclidine (PCP) Ur S: NOT DETECTED
Tricyclic, Ur Screen: NOT DETECTED

## 2022-08-31 LAB — COMPREHENSIVE METABOLIC PANEL
ALT: 19 U/L (ref 0–44)
AST: 22 U/L (ref 15–41)
Albumin: 3.7 g/dL (ref 3.5–5.0)
Alkaline Phosphatase: 75 U/L (ref 38–126)
Anion gap: 11 (ref 5–15)
BUN: 13 mg/dL (ref 6–20)
CO2: 24 mmol/L (ref 22–32)
Calcium: 8.8 mg/dL — ABNORMAL LOW (ref 8.9–10.3)
Chloride: 104 mmol/L (ref 98–111)
Creatinine, Ser: 1.04 mg/dL — ABNORMAL HIGH (ref 0.44–1.00)
GFR, Estimated: >60 mL/min (ref >60)
Glucose, Bld: 115 mg/dL — ABNORMAL HIGH (ref 70–99)
Potassium: 4.2 mmol/L (ref 3.5–5.1)
Sodium: 139 mmol/L (ref 135–145)
Total Bilirubin: 0.8 mg/dL (ref 0.3–1.2)
Total Protein: 7.6 g/dL (ref 6.5–8.1)

## 2022-08-31 LAB — TROPONIN I (HIGH SENSITIVITY)
Troponin I (High Sensitivity): 6 ng/L (ref <18)
Troponin I (High Sensitivity): 9 ng/L (ref <18)
Troponin I (High Sensitivity): 8 ng/L (ref <18)

## 2022-08-31 LAB — PREGNANCY, URINE: Preg Test, Ur: NEGATIVE

## 2022-08-31 LAB — APTT: aPTT: 31 seconds (ref 24–36)

## 2022-08-31 LAB — PROTIME-INR
INR: 1.1 (ref 0.8–1.2)
Prothrombin Time: 14 seconds (ref 11.4–15.2)

## 2022-08-31 LAB — HEMOGLOBIN A1C
Hgb A1c MFr Bld: 6.1 % — ABNORMAL HIGH (ref 4.8–5.6)
Mean Plasma Glucose: 128.37 mg/dL

## 2022-08-31 MED ORDER — HYDROCHLOROTHIAZIDE 25 MG PO TABS
25.0000 mg | ORAL_TABLET | Freq: Every day | ORAL | Status: DC
  Administered 2022-08-31 – 2022-09-01 (×2): 25 mg via ORAL
  Filled 2022-08-31 (×2): qty 1

## 2022-08-31 MED ORDER — CLONAZEPAM 0.5 MG PO TABS
0.5000 mg | ORAL_TABLET | Freq: Every day | ORAL | Status: DC
  Administered 2022-08-31 – 2022-09-01 (×2): 0.5 mg via ORAL
  Filled 2022-08-31 (×2): qty 1

## 2022-08-31 MED ORDER — NITROGLYCERIN 0.4 MG SL SUBL
0.4000 mg | SUBLINGUAL_TABLET | SUBLINGUAL | Status: DC | PRN
  Administered 2022-08-31: 0.4 mg via SUBLINGUAL
  Filled 2022-08-31: qty 1

## 2022-08-31 MED ORDER — LABETALOL HCL 5 MG/ML IV SOLN
20.0000 mg | Freq: Once | INTRAVENOUS | Status: AC
  Administered 2022-08-31: 20 mg via INTRAVENOUS
  Filled 2022-08-31: qty 4

## 2022-08-31 MED ORDER — LEVETIRACETAM 500 MG PO TABS
500.0000 mg | ORAL_TABLET | Freq: Two times a day (BID) | ORAL | Status: DC
  Administered 2022-08-31 – 2022-09-01 (×3): 500 mg via ORAL
  Filled 2022-08-31 (×3): qty 1

## 2022-08-31 MED ORDER — NICOTINE 21 MG/24HR TD PT24
21.0000 mg | MEDICATED_PATCH | Freq: Every day | TRANSDERMAL | Status: DC
  Administered 2022-08-31: 21 mg via TRANSDERMAL
  Filled 2022-08-31 (×2): qty 1

## 2022-08-31 MED ORDER — TRAZODONE HCL 100 MG PO TABS: 300.0000 mg | ORAL_TABLET | Freq: Every day | ORAL | Status: DC

## 2022-08-31 MED ORDER — ONDANSETRON HCL 4 MG/2ML IJ SOLN: 4.0000 mg | Freq: Three times a day (TID) | INTRAMUSCULAR | Status: DC | PRN

## 2022-08-31 MED ORDER — FLUOXETINE HCL 10 MG PO CAPS
10.0000 mg | ORAL_CAPSULE | Freq: Every day | ORAL | Status: DC
  Administered 2022-08-31: 10 mg via ORAL
  Filled 2022-08-31 (×2): qty 1

## 2022-08-31 MED ORDER — ALBUTEROL SULFATE (2.5 MG/3ML) 0.083% IN NEBU: 3.0000 mL | INHALATION_SOLUTION | RESPIRATORY_TRACT | Status: DC | PRN

## 2022-08-31 MED ORDER — HYDROCODONE-ACETAMINOPHEN 5-325 MG PO TABS
1.0000 | ORAL_TABLET | Freq: Four times a day (QID) | ORAL | Status: DC | PRN
  Administered 2022-08-31 (×2): 1 via ORAL
  Filled 2022-08-31 (×2): qty 1

## 2022-08-31 MED ORDER — PHENYTOIN SODIUM EXTENDED 100 MG PO CAPS
300.0000 mg | ORAL_CAPSULE | Freq: Two times a day (BID) | ORAL | Status: DC
  Administered 2022-08-31 – 2022-09-01 (×3): 300 mg via ORAL
  Filled 2022-08-31 (×3): qty 3

## 2022-08-31 MED ORDER — MORPHINE SULFATE (PF) 2 MG/ML IV SOLN: 2.0000 mg | INTRAVENOUS | Status: DC | PRN

## 2022-08-31 MED ORDER — METOPROLOL TARTRATE 25 MG PO TABS
25.0000 mg | ORAL_TABLET | Freq: Two times a day (BID) | ORAL | Status: DC
  Administered 2022-08-31 – 2022-09-01 (×3): 25 mg via ORAL
  Filled 2022-08-31 (×3): qty 1

## 2022-08-31 MED ORDER — LOSARTAN POTASSIUM 50 MG PO TABS
100.0000 mg | ORAL_TABLET | Freq: Every day | ORAL | Status: DC
  Administered 2022-08-31 – 2022-09-01 (×2): 100 mg via ORAL
  Filled 2022-08-31 (×2): qty 2

## 2022-08-31 MED ORDER — ACETAMINOPHEN 325 MG PO TABS: 650.0000 mg | ORAL_TABLET | Freq: Four times a day (QID) | ORAL | Status: DC | PRN

## 2022-08-31 MED ORDER — HYDRALAZINE HCL 20 MG/ML IJ SOLN
10.0000 mg | INTRAMUSCULAR | Status: DC | PRN
  Administered 2022-08-31 – 2022-09-01 (×2): 10 mg via INTRAVENOUS
  Filled 2022-08-31 (×2): qty 1

## 2022-08-31 MED ORDER — DIPHENHYDRAMINE HCL 50 MG/ML IJ SOLN
12.5000 mg | Freq: Three times a day (TID) | INTRAMUSCULAR | Status: DC | PRN
  Administered 2022-09-01: 12.5 mg via INTRAVENOUS
  Filled 2022-08-31: qty 1

## 2022-08-31 MED ORDER — LORAZEPAM 2 MG/ML IJ SOLN: 2.0000 mg | INTRAMUSCULAR | Status: DC | PRN

## 2022-08-31 MED ORDER — IOHEXOL 350 MG/ML SOLN
100.0000 mL | Freq: Once | INTRAVENOUS | Status: AC | PRN
  Administered 2022-08-31: 100 mL via INTRAVENOUS

## 2022-08-31 MED ORDER — ESCITALOPRAM OXALATE 10 MG PO TABS: 10.0000 mg | ORAL_TABLET | Freq: Every day | ORAL | Status: DC

## 2022-08-31 MED ORDER — ASPIRIN 81 MG PO TBEC
81.0000 mg | DELAYED_RELEASE_TABLET | Freq: Every day | ORAL | Status: DC
Start: 2022-08-31 — End: 2022-08-31

## 2022-08-31 NOTE — ED Triage Notes (Signed)
Pt sts concern for elevated BP. Sts running out of BP medications yesterday. Was seen at eye doctor yesterday and dx with bleeding behind her retina. Has follow up appt later today. Denies eye pain/ headache. Has had spots in her vision.

## 2022-08-31 NOTE — ED Provider Notes (Signed)
[facility] Provider Note    Event Date/Time   First MD Initiated Contact with Patient 08/31/22 [phone]     (approximate)   History   Hypertension   HPI  Kristy Kirby is a 47 y.o. female with a history of poorly controlled high blood pressure who was sent in by ophthalmology, Dr. Druscilla Brownie.  Patient reports she went to ophthalmologist yesterday, was having changes in vision, was diagnosed with some sort of "hemorrhages "and referred to the emergency department given blood pressure over 200 systolic.  Patient reports she ran out of her blood pressure medication yesterday as well.     Physical Exam   Triage Vital Signs: ED Triage Vitals  Encounter Vitals Group     BP 08/31/22 0635 (!) 224/153     Systolic BP Percentile --      Diastolic BP Percentile --      Pulse Rate 08/31/22 0635 94     Resp 08/31/22 0635 15     Temp 08/31/22 0635 98.8 F (37.1 C)     Temp Source 08/31/22 0635 Oral     SpO2 08/31/22 0635 100 %     Weight --      Height 08/31/22 0636 1.575 m (5\' 2" )     Head Circumference --      Peak Flow --      Pain Score 08/31/22 0633 0     Pain Loc --      Pain Education --      Exclude from Growth Chart --     Most recent vital signs: Vitals:   08/31/22 0745 08/31/22 0800  BP: (!) 191/126 (!) 182/129  Pulse: 86 86  Resp: 13 19  Temp:    SpO2: 99% 97%     General: Awake, no distress.  CV:  Good peripheral perfusion.  Resp:  Normal effort.  Abd:  No distention.  Other:     ED Results / Procedures / Treatments   Labs (all labs ordered are listed, but only abnormal results are displayed) Labs Reviewed  CBC - Abnormal; Notable for the following components:      Result Value   WBC 10.6 (*)    RBC 5.26 (*)    Hemoglobin 15.2 (*)    HCT 47.2 (*)    RDW 16.2 (*)    All other components within normal limits  COMPREHENSIVE METABOLIC PANEL - Abnormal; Notable for the following components:   Glucose, Bld 115 (*)     Creatinine, Ser 1.04 (*)    Calcium 8.8 (*)    All other components within normal limits  URINE DRUG SCREEN, QUALITATIVE ([facility] ONLY)  PREGNANCY, URINE     EKG     RADIOLOGY     PROCEDURES:  Critical Care performed: yes  CRITICAL CARE Performed by: Jene Every   Total critical care time: 30 minutes  Critical care time was exclusive of separately billable procedures and treating other patients.  Critical care was necessary to treat or prevent imminent or life-threatening deterioration.  Critical care was time spent personally by me on the following activities: development of treatment plan with patient and/or surrogate as well as nursing, discussions with consultants, evaluation of patient's response to treatment, examination of patient, obtaining history from patient or surrogate, ordering and performing treatments and interventions, ordering and review of laboratory studies, ordering and review of radiographic studies, pulse oximetry and re-evaluation of patient's condition.   Procedures   MEDICATIONS ORDERED IN ED: Medications  labetalol (NORMODYNE) injection 20 mg (has no administration in time range)  ondansetron (ZOFRAN) injection 4 mg (has no administration in time range)  acetaminophen (TYLENOL) tablet 650 mg (has no administration in time range)  albuterol (PROVENTIL) (2.5 MG/3ML) 0.083% nebulizer solution 3 mL (has no administration in time range)  HYDROcodone-acetaminophen (NORCO/VICODIN) 5-325 MG per tablet 1 tablet (has no administration in time range)  hydrochlorothiazide (HYDRODIURIL) tablet 25 mg (has no administration in time range)  labetalol (NORMODYNE) injection 20 mg (20 mg Intravenous Given 08/31/22 0743)     IMPRESSION / MDM / ASSESSMENT AND PLAN / ED COURSE  I reviewed the triage vital signs and the nursing notes. Patient's presentation is most consistent with acute presentation with potential threat to life or bodily function.  Patient  sent in for hypertensive emergency, I discussed with Dr. Inez Pilgrim of ophthalmology  He reports vitreous hemorrhages, optic nerve swelling consistent with hypertensive emergency.  Recommends admission for blood pressure control  I have treated with 2 doses of IV labetalol, discussed with Dr. Clyde Lundborg of the hospitalist service for admission        FINAL CLINICAL IMPRESSION(S) / ED DIAGNOSES   Final diagnoses:  Hypertensive emergency     Rx / DC Orders   ED Discharge Orders     None        Note:  This document was prepared using Dragon voice recognition software and may include unintentional dictation errors.   Jene Every, MD 08/31/22 [phone]

## 2022-08-31 NOTE — H&P (Addendum)
History and Physical    Kristy Kirby VHQ:[id number] DOB: 10/23/1975 DOA: 08/31/2022  Referring MD/NP/PA:   PCP: Pcp, No   Patient coming from:  The patient is coming from home.     Chief Complaint: blurry vision in right eye and elevated blood pressure  HPI: Kristy Kirby is a 47 y.o. female with medical history significant of HTN, depression with anxiety, seizure, obesity, tobacco abuse, who presents with blurry vision in the right eye and elevated blood pressure.  Patient states that she has blurry vision in the left eye with minimal vision loss for about 2 weeks.  Patient was seen by ophthalmologist, Dr. Johann Capers yesterday, and was diagnosed with some sort of "hemorrhages ". She was found to have significantly elevated blood pressure 2 4/176 per patient, and referred to the emergency department for blood pressure control. Patient reports she ran out of her blood pressure medication yesterday, but she took last dose of all blood pressure medications last night, including HCTZ, Cozaar and metoprolol. Patient does not have unilateral numbness or tinglings in extremities.  No facial droop or slurred speech.  Denies chest pain, cough, shortness of breath.  No nausea, vomiting, diarrhea or abdominal pain.  No symptoms of UTI.  Patient is found to have elevated blood pressure 224/154, which improved to 182/129 after given 20 mg of IV labetalol, then further improved to 152/87 after giving another dose of 20 mg for labetalol in ED.  Addendum: initially patient did not have chest pain, later on developed chest pain, which is located in the right side of chest, radiating to the right back, associated with shortness of breath, pain is sharp, 6 out of 10 in severity, not aggravated or alleviated by any known factors.  Data reviewed independently and ED Course: pt was found to have WBC 10.6, GFR> 60, temperature normal, blood pressure 96, RR 19, oxygen saturation 97% on room air. Pt is placed in PCU  for observation.  CTA: 1. Normal contour and caliber of the thoracic and abdominal aorta. No evidence of aneurysm, dissection, or other acute aortic pathology. 2. Mild, scattered aortic atherosclerosis of the infrarenal abdominal aorta. 3. No acute findings of the chest, abdomen, or pelvis.   Aortic Atherosclerosis (ICD10-I70.0).   EKG: I have personally reviewed.  Sinus rhythm, QTc 503, LAE, LAD, poor R wave progression   Review of Systems:   General: no fevers, chills, no body weight gain, fatigue HEENT: has blurry vision, no hearing changes or sore throat Respiratory: no dyspnea, coughing, wheezing CV: has chest pain, no palpitations GI: no nausea, vomiting, abdominal pain, diarrhea, constipation GU: no dysuria, burning on urination, increased urinary frequency, hematuria  Ext: no leg edema Neuro: no unilateral weakness, numbness, or tingling, no hearing loss Skin: no rash, no skin tear. MSK: No muscle spasm, no deformity, no limitation of range of movement in spin Heme: No easy bruising.  Travel history: No recent long distant travel.   Allergy:  Allergies  Allergen Reactions   Banana    Coconut Fatty Acids    Penicillins    Sulfa Antibiotics Hives    GI upset    Coconut (Cocos Nucifera) Rash    Past Medical History:  Diagnosis Date   Anxiety    Arthritis    Rheumatoid   Depression    HTN (hypertension)    Sciatica    Seizures (HCC)     Past Surgical History:  Procedure Laterality Date   TUBAL LIGATION  Social History:  reports that she has been smoking cigarettes. She has never used smokeless tobacco. She reports that she does not drink alcohol and does not use drugs.  Family History:  Family History  Problem Relation Age of Onset   Hypertension Mother    Diabetes Mother    Stroke Mother    Hypertension Father    Diabetes Father    Heart disease Father    Stroke Father    Diabetes Sister    Stroke Paternal Grandmother    Stroke  Paternal Grandfather      Prior to Admission medications   Medication Sig Start Date End Date Taking? Authorizing Provider  albuterol (VENTOLIN HFA) 108 (90 Base) MCG/ACT inhaler Inhale 1-2 puffs into the lungs every 6 (six) hours as needed for wheezing or shortness of breath. 09/13/19   [name], MD  clonazePAM (KLONOPIN) 0.5 MG tablet Take by mouth.    [provider]  escitalopram (LEXAPRO) 10 MG tablet Take by mouth. 09/09/20 09/09/21  [provider]  FLUoxetine (PROZAC) 10 MG capsule Take 1 capsule by mouth once daily 11/21/19   [name], MD  hydrochlorothiazide (HYDRODIURIL) 25 MG tablet Take 1 tablet (25 mg total) by mouth daily. 09/13/19   [name], MD  HYDROcodone-acetaminophen (NORCO/VICODIN) 5-325 MG tablet Take 1 tablet by mouth every 6 (six) hours as needed for moderate pain. 03/14/22   Fisher, Roselyn Bering, PA-C  levETIRAcetam (KEPPRA) 500 MG tablet Take 1 tablet (500 mg total) by mouth 2 (two) times daily. 01/22/22 04/22/22  Varney Daily, PA  lisinopril (ZESTRIL) 10 MG tablet Take by mouth. 04/18/17   [provider]  losartan (COZAAR) 100 MG tablet Take by mouth. 09/09/20   [provider]  meloxicam (MOBIC) 7.5 MG tablet Take 1 tablet (7.5 mg total) by mouth daily. 05/03/22 05/03/23  Tommi Rumps, PA-C  metoprolol tartrate (LOPRESSOR) 25 MG tablet Take 1 tablet (25 mg total) by mouth 2 (two) times daily. 09/13/19   [name], MD  norethindrone (AYGESTIN) 5 MG tablet Take 1 tablet (5 mg total) by mouth daily for 10 days. 04/22/21 05/02/21  Copland, Ilona Sorrel, PA-C  phenytoin (DILANTIN) 100 MG ER capsule Take 3 capsules (300 mg total) by mouth 2 (two) times daily. 01/22/22 05/22/22  Varney Daily, PA  potassium chloride SA (KLOR-CON M20) 20 MEQ tablet Take 1 tablet (20 mEq total) by mouth daily. 08/17/19   [name], MD  predniSONE (STERAPRED UNI-PAK 21 TAB) 10 MG (21) TBPK tablet Take 6 pills on day one then decrease by 1  pill each day 03/14/22   Faythe Ghee, PA-C  trazodone (DESYREL) 300 MG tablet Take 300 mg by mouth at bedtime. 10/23/20   [provider]    Physical Exam: Vitals:   08/31/22 1530 08/31/22 1545 08/31/22 1600 08/31/22 1715  BP: (!) 148/107  (!) 150/96 (!) 155/109  Pulse: 85 82 86   Resp:      Temp:      TempSrc:      SpO2: 97% 98% 96%   Height:       General: Not in acute distress HEENT:       Eyes: PERRL, EOMI, no jaundice       ENT: No discharge from the ears and nose, no pharynx injection, no tonsillar enlargement.        Neck: No JVD, no bruit, no mass felt. Heme: No neck lymph node enlargement. Cardiac: S1/S2,  RRR, No murmurs, No gallops or rubs. Respiratory: No rales, wheezing, rhonchi or rubs. GI: Soft, nondistended, nontender, no rebound pain, no organomegaly, BS present. GU: No hematuria Ext: No pitting leg edema bilaterally. 1+DP/PT pulse bilaterally. Musculoskeletal: No joint deformities, No joint redness or warmth, no limitation of ROM in spin. Skin: No rashes.  Neuro: Alert, oriented X3, cranial nerves II-XII grossly intact, moves all extremities normally.  Psych: Patient is not psychotic, no suicidal or hemocidal ideation.  Labs on Admission: I have personally reviewed following labs and imaging studies  CBC: Recent Labs  Lab 08/31/22 0645  WBC 10.6*  HGB 15.2*  HCT 47.2*  MCV 89.7  PLT 228   Basic Metabolic Panel: Recent Labs  Lab 08/31/22 0645  NA 139  K 4.2  CL 104  CO2 24  GLUCOSE 115*  BUN 13  CREATININE 1.04*  CALCIUM 8.8*   GFR: CrCl cannot be calculated (Unknown ideal weight.). Liver Function Tests: Recent Labs  Lab 08/31/22 0645  AST 22  ALT 19  ALKPHOS 75  BILITOT 0.8  PROT 7.6  ALBUMIN 3.7   No results for input(s): "LIPASE", "AMYLASE" in the last 168 hours. No results for input(s): "AMMONIA" in the last 168 hours. Coagulation Profile: Recent Labs  Lab 08/31/22 1215  INR 1.1   Cardiac Enzymes: No results  for input(s): "CKTOTAL", "CKMB", "CKMBINDEX", "TROPONINI" in the last 168 hours. BNP (last 3 results) No results for input(s): "PROBNP" in the last 8760 hours. HbA1C: Recent Labs    08/31/22 1215  HGBA1C 6.1*   CBG: No results for input(s): "GLUCAP" in the last 168 hours. Lipid Profile: Recent Labs    08/31/22 1215  CHOL 237*  HDL 52  LDLCALC 140*  TRIG 223*  CHOLHDL 4.6   Thyroid Function Tests: No results for input(s): "TSH", "T4TOTAL", "FREET4", "T3FREE", "THYROIDAB" in the last 72 hours. Anemia Panel: No results for input(s): "VITAMINB12", "FOLATE", "FERRITIN", "TIBC", "IRON", "RETICCTPCT" in the last 72 hours. Urine analysis:    Component Value Date/Time   COLORURINE STRAW (A) 06/29/2019 1232   APPEARANCEUR CLEAR (A) 06/29/2019 1232   APPEARANCEUR Clear 06/14/2014 2140   LABSPEC 1.008 06/29/2019 1232   LABSPEC 1.027 06/14/2014 2140   PHURINE 6.0 06/29/2019 1232   GLUCOSEU NEGATIVE 06/29/2019 1232   GLUCOSEU Negative 06/14/2014 2140   HGBUR SMALL (A) 06/29/2019 1232   BILIRUBINUR neg 03/16/2021 1723   BILIRUBINUR Negative 06/14/2014 2140   KETONESUR NEGATIVE 06/29/2019 1232   PROTEINUR Negative 03/16/2021 1723   PROTEINUR NEGATIVE 06/29/2019 1232   NITRITE neg 03/16/2021 1723   NITRITE NEGATIVE 06/29/2019 1232   LEUKOCYTESUR Moderate (2+) (A) 03/16/2021 1723   LEUKOCYTESUR NEGATIVE 06/29/2019 1232   LEUKOCYTESUR Negative 06/14/2014 2140   Sepsis Labs: @LABRCNTIP (procalcitonin:4,lacticidven:4) )No results found for this or any previous visit (from the past 240 hour(s)).   Radiological Exams on Admission: CT Angio Chest/Abd/Pel for Dissection W and/or W/WO  Result Date: 08/31/2022 CLINICAL DATA:  Acute aortic syndrome suspected, hypertension EXAM: CT ANGIOGRAPHY CHEST, ABDOMEN AND PELVIS TECHNIQUE: Non-contrast CT of the chest was initially obtained. Multidetector CT imaging through the chest, abdomen and pelvis was performed using the standard protocol during  bolus administration of intravenous contrast. Multiplanar reconstructed images and MIPs were obtained and reviewed to evaluate the vascular anatomy. RADIATION DOSE REDUCTION: This exam was performed according to the departmental dose-optimization program which includes automated exposure control, adjustment of the mA and/or kV according to patient size and/or use of iterative reconstruction technique. CONTRAST:  OMNIPAQUE  IOHEXOL 350 MG/ML SOLN COMPARISON:  CT pelvis, 09/06/2020, CT chest angiogram, 06/03/2020 FINDINGS: CTA CHEST FINDINGS VASCULAR Aorta: Satisfactory opacification of the aorta. Normal contour and caliber of the thoracic aorta. No evidence of aneurysm, dissection, or other acute aortic pathology. Cardiovascular: No evidence of pulmonary embolism on limited non-tailored examination. Normal heart size. No pericardial effusion. Review of the MIP images confirms the above findings. NON VASCULAR Mediastinum/Nodes: No enlarged mediastinal, hilar, or axillary lymph nodes. Thymic remnant in the anterior mediastinum. Thyroid gland, trachea, and esophagus demonstrate no significant findings. Lungs/Pleura: Lungs are clear. No pleural effusion or pneumothorax. Musculoskeletal: No chest wall abnormality. No acute osseous findings. Review of the MIP images confirms the above findings. CTA ABDOMEN AND PELVIS FINDINGS VASCULAR Normal contour and caliber of the abdominal aorta. No evidence of aneurysm, dissection, or other acute aortic pathology. Standard branching pattern of the abdominal aorta with solitary bilateral renal arteries. Mild, scattered aortic atherosclerosis of the infrarenal vessel. Review of the MIP images confirms the above findings. NON-VASCULAR Hepatobiliary: No solid liver abnormality is seen. No gallstones, gallbladder wall thickening, or biliary dilatation. Pancreas: Unremarkable. No pancreatic ductal dilatation or surrounding inflammatory changes. Spleen: Normal in size without  significant abnormality. Adrenals/Urinary Tract: Adrenal glands are unremarkable. Kidneys are normal, without renal calculi, solid lesion, or hydronephrosis. Bladder is unremarkable. Stomach/Bowel: Stomach is within normal limits. Appendix appears normal. No evidence of bowel wall thickening, distention, or inflammatory changes. Lymphatic: No enlarged abdominal or pelvic lymph nodes. Reproductive: No mass or other significant abnormality. Other: No abdominal wall hernia or abnormality. No ascites. Musculoskeletal: No acute osseous findings. IMPRESSION: 1. Normal contour and caliber of the thoracic and abdominal aorta. No evidence of aneurysm, dissection, or other acute aortic pathology. 2. Mild, scattered aortic atherosclerosis of the infrarenal abdominal aorta. 3. No acute findings of the chest, abdomen, or pelvis. Aortic Atherosclerosis (ICD10-I70.0). Electronically Signed   By: Jearld Lesch M.D.   On: 08/31/2022 14:43      Assessment/Plan Principal Problem:   Hypertensive emergency Active Problems:   Chest pain   Vitreous hemorrhage (HCC)   Seizure disorder, grand mal (HCC)   Prolonged QT interval   Anxiety associated with depression   Obesity, Class III, BMI 40-49.9 (morbid obesity) (HCC)   Tobacco abuse   Assessment and Plan:  Hypertensive emergency and chest pain: Blood pressure is up to 224/154.  Initially denies chest pain or shortness of breath, later on she reported right-sided chest pain associated with shortness of breath. CTA is done which is negative for dissection.  Possibly due to demand ischemia.  -Placed in PCU for observation -As needed nitroglycerin, morphine for chest pain -Trend troponin -Check A1c, FLP, UDS -will not start aspirin due to vitreous hemorrhage. -IV hydralazine as needed -HCTZ, losartan, metoprolol  Vitreous hemorrhage (HCC):  Dr. Cyril Loosen of ED has discussed with Dr. Inez Pilgrim of ophthalmology, " He reports vitreous hemorrhages, optic nerve swelling  consistent with hypertensive emergency.  Recommends admission for blood pressure control". Per Dr. Cyril Loosen, ophthalmologist does not think patient needs any procedure, just need to follow-up with ophthalmology after blood pressure is controlled. -Blood pressure control -Follow-up with ophthalmology  Seizure disorder, grand mal (HCC) -Seizure precaution -As needed Ativan for seizure -Continue Keppra, phenytoin and Klonopin  Prolonged QT interval: QTc 503 -Hold Lexapro and trazdone  Anxiety associated with depression -Continue Prozac -Hold Lexapro and trazdone  Obesity, Class III, BMI 40-49.9 (morbid obesity) (HCC): Body weight 119.7 kg, BMI 48.25 -Encourage losing weight -Healthy diet and exercise  Tobacco abuse: -Nicotine patch  DVT ppx: SCD  Code Status: Full code    Family Communication:   Yes, patient's granddaughter   at bed side.       Disposition Plan:  Anticipate discharge back to previous environment  Consults called: Dr. Artis Flock of ED consulted Dr. Inez Pilgrim of ophthalmology   Admission status and Level of care: Progressive:    for obs    Dispo: The patient is from: Home              Anticipated d/c is to: Home              Anticipated d/c date is: 1 day              Patient currently is not medically stable to d/c.    Severity of Illness:  The appropriate patient status for this patient is OBSERVATION. Observation status is judged to be reasonable and necessary in order to provide the required intensity of service to ensure the patient's safety. The patient's presenting symptoms, physical exam findings, and initial radiographic and laboratory data in the context of their medical condition is felt to place them at decreased risk for further clinical deterioration. Furthermore, it is anticipated that the patient will be medically stable for discharge from the hospital within 2 midnights of admission.        Date of Service 08/31/2022    Lorretta Harp [facility]   If 7PM-7AM, please contact night-coverage [email or web address] 08/31/2022, 5:36 PM Sherryll Burger

## 2022-08-31 NOTE — ED Notes (Signed)
Dr. Clyde Lundborg informed of new right sided chest pain. EKG captured an troponin to ordered

## 2022-08-31 NOTE — ED Notes (Signed)
Pt states cp has resolved

## 2022-09-01 DIAGNOSIS — Z5329 Procedure and treatment not carried out because of patient's decision for other reasons: Secondary | ICD-10-CM | POA: Diagnosis not present

## 2022-09-01 DIAGNOSIS — I161 Hypertensive emergency: Secondary | ICD-10-CM | POA: Diagnosis not present

## 2022-09-01 LAB — HIV ANTIBODY (ROUTINE TESTING W REFLEX): HIV Screen 4th Generation wRfx: NONREACTIVE

## 2022-09-01 NOTE — Progress Notes (Signed)
PROGRESS NOTE  Kristy Kirby    DOB: [birth year], 47 y.o.  ZOX:[id number]    Code Status: Full Code   DOA: 08/31/2022   LOS: 0   Brief hospital course  From H&P:  "Kristy Kirby is a 47 y.o. female with medical history significant of HTN, depression with anxiety, seizure, obesity, tobacco abuse, who presents with blurry vision in the right eye and elevated blood pressure.   Patient states that she has blurry vision in the left eye with minimal vision loss for about 2 weeks.  Patient was seen by ophthalmologist, Dr. Johann Capers yesterday, and was diagnosed with some sort of "hemorrhages ". She was found to have significantly elevated blood pressure 2 4/176 per patient, and referred to the emergency department for blood pressure control. Patient reports she ran out of her blood pressure medication yesterday, but she took last dose of all blood pressure medications last night, including HCTZ, Cozaar and metoprolol. Patient does not have unilateral numbness or tinglings in extremities.  No facial droop or slurred speech.  Denies chest pain, cough, shortness of breath.  No nausea, vomiting, diarrhea or abdominal pain.  No symptoms of UTI.   Patient is found to have elevated blood pressure 224/154, which improved to 182/129 after given 20 mg of IV labetalol, then further improved to 152/87 after giving another dose of 20 mg for labetalol in ED.   Addendum: initially patient did not have chest pain, later on developed chest pain, which is located in the right side of chest, radiating to the right back, associated with shortness of breath, pain is sharp, 6 out of 10 in severity, not aggravated or alleviated by any known factors.   Data reviewed independently and ED Course: pt was found to have WBC 10.6, GFR> 60, temperature normal, blood pressure 96, RR 19, oxygen saturation 97% on room air. Pt is placed in PCU for observation.   CTA: 1. Normal contour and caliber of the thoracic and abdominal aorta. No  evidence of aneurysm, dissection, or other acute aortic pathology. 2. Mild, scattered aortic atherosclerosis of the infrarenal abdominal aorta. 3. No acute findings of the chest, abdomen, or pelvis.   Aortic Atherosclerosis (ICD10-I70.0).   EKG: I have personally reviewed.  Sinus rhythm, QTc 503, LAE, LAD, poor R wave progression"  On morning of my assumption of care to round on patient- she declined all labs and testing and left the hospital AMA prior to being examined by me.  Final BP recorded was 162/98 after receiving her home antihypertensives as well as IV labetalol, hydralazine.   Assessment & Plan  Follow up with primary care. Continue to adhere to home medications.   Subjective 09/01/22    Pt left prior to encounter   Objective   Vitals:   09/01/22 0203 09/01/22 0400 09/01/22 0700 09/01/22 0804  BP: (!) 171/117 (!) 152/112 (!) 159/110   Pulse:  89 86   Resp:  13 19   Temp:    97.7 F (36.5 C)  TempSrc:    Oral  SpO2:  99% 96%   Height:       No intake or output data in the 24 hours ending 09/01/22 0830 There were no vitals filed for this visit.   Labs   I have personally reviewed the following labs and imaging studies CBC    Component Value Date/Time   WBC 10.6 (H) 08/31/2022 0645   RBC 5.26 (H) 08/31/2022 0645   HGB 15.2 (H) 08/31/2022 0645  HGB 13.8 06/28/2013 1346   HCT 47.2 (H) 08/31/2022 0645   HCT 41.3 06/28/2013 1346   PLT 228 08/31/2022 0645   PLT 198 06/28/2013 1346   MCV 89.7 08/31/2022 0645   MCV 93 06/28/2013 1346   MCH 28.9 08/31/2022 0645   MCHC 32.2 08/31/2022 0645   RDW 16.2 (H) 08/31/2022 0645   RDW 14.6 (H) 06/28/2013 1346   LYMPHSABS 3.1 01/22/2022 0941   LYMPHSABS 2.8 01/03/2012 1159   MONOABS 0.5 01/22/2022 0941   MONOABS 0.5 01/03/2012 1159   EOSABS 0.0 01/22/2022 0941   EOSABS 0.0 01/03/2012 1159   BASOSABS 0.0 01/22/2022 0941   BASOSABS 0.1 01/03/2012 1159      Latest Ref Rng & Units 08/31/2022    6:45 AM 05/14/2022     4:31 PM 01/22/2022    9:41 AM  BMP  Glucose 70 - 99 mg/dL 161  096  045   BUN 6 - 20 mg/dL 13  11  24    Creatinine 0.44 - 1.00 mg/dL 4.09  8.11  9.14   Sodium 135 - 145 mmol/L 139  137  141   Potassium 3.5 - 5.1 mmol/L 4.2  3.7  3.4   Chloride 98 - 111 mmol/L 104  99  107   CO2 22 - 32 mmol/L 24  26  26    Calcium 8.9 - 10.3 mg/dL 8.8  9.0  9.1     CT Angio Chest/Abd/Pel for Dissection W and/or W/WO  Result Date: 08/31/2022 CLINICAL DATA:  Acute aortic syndrome suspected, hypertension EXAM: CT ANGIOGRAPHY CHEST, ABDOMEN AND PELVIS TECHNIQUE: Non-contrast CT of the chest was initially obtained. Multidetector CT imaging through the chest, abdomen and pelvis was performed using the standard protocol during bolus administration of intravenous contrast. Multiplanar reconstructed images and MIPs were obtained and reviewed to evaluate the vascular anatomy. RADIATION DOSE REDUCTION: This exam was performed according to the departmental dose-optimization program which includes automated exposure control, adjustment of the mA and/or kV according to patient size and/or use of iterative reconstruction technique. CONTRAST:  OMNIPAQUE IOHEXOL 350 MG/ML SOLN COMPARISON:  CT pelvis, 09/06/2020, CT chest angiogram, 06/03/2020 FINDINGS: CTA CHEST FINDINGS VASCULAR Aorta: Satisfactory opacification of the aorta. Normal contour and caliber of the thoracic aorta. No evidence of aneurysm, dissection, or other acute aortic pathology. Cardiovascular: No evidence of pulmonary embolism on limited non-tailored examination. Normal heart size. No pericardial effusion. Review of the MIP images confirms the above findings. NON VASCULAR Mediastinum/Nodes: No enlarged mediastinal, hilar, or axillary lymph nodes. Thymic remnant in the anterior mediastinum. Thyroid gland, trachea, and esophagus demonstrate no significant findings. Lungs/Pleura: Lungs are clear. No pleural effusion or pneumothorax. Musculoskeletal: No chest wall  abnormality. No acute osseous findings. Review of the MIP images confirms the above findings. CTA ABDOMEN AND PELVIS FINDINGS VASCULAR Normal contour and caliber of the abdominal aorta. No evidence of aneurysm, dissection, or other acute aortic pathology. Standard branching pattern of the abdominal aorta with solitary bilateral renal arteries. Mild, scattered aortic atherosclerosis of the infrarenal vessel. Review of the MIP images confirms the above findings. NON-VASCULAR Hepatobiliary: No solid liver abnormality is seen. No gallstones, gallbladder wall thickening, or biliary dilatation. Pancreas: Unremarkable. No pancreatic ductal dilatation or surrounding inflammatory changes. Spleen: Normal in size without significant abnormality. Adrenals/Urinary Tract: Adrenal glands are unremarkable. Kidneys are normal, without renal calculi, solid lesion, or hydronephrosis. Bladder is unremarkable. Stomach/Bowel: Stomach is within normal limits. Appendix appears normal. No evidence of bowel wall thickening, distention, or inflammatory changes. Lymphatic:  No enlarged abdominal or pelvic lymph nodes. Reproductive: No mass or other significant abnormality. Other: No abdominal wall hernia or abnormality. No ascites. Musculoskeletal: No acute osseous findings. IMPRESSION: 1. Normal contour and caliber of the thoracic and abdominal aorta. No evidence of aneurysm, dissection, or other acute aortic pathology. 2. Mild, scattered aortic atherosclerosis of the infrarenal abdominal aorta. 3. No acute findings of the chest, abdomen, or pelvis. Aortic Atherosclerosis (ICD10-I70.0). Electronically Signed   By: Jearld Lesch M.D.   On: 08/31/2022 14:43    Disposition Plan & Communication  Patient status: Observation  Admitted From: Home Planned disposition location: left AMA prior to exam    Author: Leeroy Bock, DO [facility] 09/01/2022, 8:30 AM   Available by Epic secure chat 7AM-7PM. If 7PM-7AM, please contact  night-coverage.  [facility] contact information found on [email or web address].

## 2022-09-01 NOTE — Plan of Care (Signed)
Patient is refusing to get labs drawn - States we can pull through the IV or not at all. I did educate that she is Inpatient status which changes how we collect labs, but still refused. Stated, " I'll just leave if you need to stick me."   Dr. Informed and asked to round if able.  Patient requesting to leave AMA.  DR. Informed.  IV removed, morning meds given and AMA paperwork signed.

## 2022-09-02 DIAGNOSIS — Z5329 Procedure and treatment not carried out because of patient's decision for other reasons: Secondary | ICD-10-CM

## 2022-10-22 ENCOUNTER — Emergency Department
Admission: EM | Admit: 2022-10-22 | Discharge: 2022-10-22 | Disposition: A | Discharge status: Home / Self Care | Payer: Medicaid Other | Attending: Emergency Medicine | Admitting: Emergency Medicine

## 2022-10-22 ENCOUNTER — Emergency Department: Payer: Medicaid Other

## 2022-10-22 ENCOUNTER — Other Ambulatory Visit: Payer: Self-pay

## 2022-10-22 DIAGNOSIS — I10 Essential (primary) hypertension: Secondary | ICD-10-CM | POA: Insufficient documentation

## 2022-10-22 DIAGNOSIS — M549 Dorsalgia, unspecified: Secondary | ICD-10-CM | POA: Diagnosis present

## 2022-10-22 DIAGNOSIS — M545 Low back pain, unspecified: Secondary | ICD-10-CM | POA: Diagnosis not present

## 2022-10-22 DIAGNOSIS — R1084 Generalized abdominal pain: Secondary | ICD-10-CM | POA: Diagnosis not present

## 2022-10-22 LAB — CBC WITH DIFFERENTIAL/PLATELET
Abs Immature Granulocytes: 0.04 10*3/uL (ref 0.00–0.07)
Basophils Absolute: 0 10*3/uL (ref 0.0–0.1)
Basophils Relative: 0 %
Eosinophils Absolute: 0.1 10*3/uL (ref 0.0–0.5)
Eosinophils Relative: 1 %
HCT: 44.9 % (ref 36.0–46.0)
Hemoglobin: 14.7 g/dL (ref 12.0–15.0)
Immature Granulocytes: 0 %
Lymphocytes Relative: 21 %
Lymphs Abs: 2 10*3/uL (ref 0.7–4.0)
MCH: 29.1 pg (ref 26.0–34.0)
MCHC: 32.7 g/dL (ref 30.0–36.0)
MCV: 88.9 fL (ref 80.0–100.0)
Monocytes Absolute: 0.6 10*3/uL (ref 0.1–1.0)
Monocytes Relative: 6 %
Neutro Abs: 7 10*3/uL (ref 1.7–7.7)
Neutrophils Relative %: 72 %
Platelets: 238 10*3/uL (ref 150–400)
RBC: 5.05 MIL/uL (ref 3.87–5.11)
RDW: 15.8 % — ABNORMAL HIGH (ref 11.5–15.5)
WBC: 9.8 10*3/uL (ref 4.0–10.5)
nRBC: 0 % (ref 0.0–0.2)

## 2022-10-22 LAB — COMPREHENSIVE METABOLIC PANEL
ALT: 23 U/L (ref 0–44)
AST: 20 U/L (ref 15–41)
Albumin: 3.9 g/dL (ref 3.5–5.0)
Alkaline Phosphatase: 72 U/L (ref 38–126)
Anion gap: 7 (ref 5–15)
BUN: 15 mg/dL (ref 6–20)
CO2: 28 mmol/L (ref 22–32)
Calcium: 8.8 mg/dL — ABNORMAL LOW (ref 8.9–10.3)
Chloride: 101 mmol/L (ref 98–111)
Creatinine, Ser: 0.93 mg/dL (ref 0.44–1.00)
GFR, Estimated: >60 mL/min (ref >60)
Glucose, Bld: 126 mg/dL — ABNORMAL HIGH (ref 70–99)
Potassium: 4.1 mmol/L (ref 3.5–5.1)
Sodium: 136 mmol/L (ref 135–145)
Total Bilirubin: 0.9 mg/dL (ref 0.3–1.2)
Total Protein: 7.4 g/dL (ref 6.5–8.1)

## 2022-10-22 LAB — HCG, QUANTITATIVE, PREGNANCY: hCG, Beta Chain, Quant, S: 2 m[IU]/mL (ref <5)

## 2022-10-22 LAB — TROPONIN I (HIGH SENSITIVITY): Troponin I (High Sensitivity): 7 ng/L (ref <18)

## 2022-10-22 MED ORDER — IOHEXOL 350 MG/ML SOLN
100.0000 mL | Freq: Once | INTRAVENOUS | Status: AC | PRN
  Administered 2022-10-22: 100 mL via INTRAVENOUS

## 2022-10-22 MED ORDER — CYCLOBENZAPRINE HCL 10 MG PO TABS: 10.0000 mg | ORAL_TABLET | Freq: Three times a day (TID) | ORAL | 0 refills | Status: AC | PRN

## 2022-10-22 MED ORDER — LIDOCAINE 5 % EX PTCH
1.0000 | MEDICATED_PATCH | CUTANEOUS | Status: DC
  Administered 2022-10-22: 1 via TRANSDERMAL
  Filled 2022-10-22: qty 1

## 2022-10-22 MED ORDER — KETOROLAC TROMETHAMINE 15 MG/ML IJ SOLN
15.0000 mg | Freq: Once | INTRAMUSCULAR | Status: AC
  Administered 2022-10-22: 15 mg via INTRAVENOUS
  Filled 2022-10-22: qty 1

## 2022-10-22 MED ORDER — METOPROLOL TARTRATE 25 MG PO TABS
25.0000 mg | ORAL_TABLET | Freq: Once | ORAL | Status: AC
  Administered 2022-10-22: 25 mg via ORAL
  Filled 2022-10-22: qty 1

## 2022-10-22 MED ORDER — DIAZEPAM 5 MG/ML IJ SOLN
10.0000 mg | Freq: Once | INTRAMUSCULAR | Status: AC
  Administered 2022-10-22: 10 mg via INTRAVENOUS
  Filled 2022-10-22: qty 2

## 2022-10-22 MED ORDER — MORPHINE SULFATE (PF) 4 MG/ML IV SOLN
4.0000 mg | Freq: Once | INTRAVENOUS | Status: AC
  Administered 2022-10-22: 4 mg via INTRAVENOUS
  Filled 2022-10-22: qty 1

## 2022-10-22 MED ORDER — ONDANSETRON HCL 4 MG/2ML IJ SOLN
4.0000 mg | Freq: Once | INTRAMUSCULAR | Status: AC
  Administered 2022-10-22: 4 mg via INTRAVENOUS
  Filled 2022-10-22: qty 2

## 2022-10-22 NOTE — ED Notes (Signed)
Pt back from CT

## 2022-10-22 NOTE — Discharge Instructions (Signed)
You were seen in the emergency department today for evaluation of your back pain. Fortunately, your exam here is reassuring. You can take Tylenol and naproxen to help with your pain, unless there is another reason you should not take these. You can also try lidocaine patches that are available over-the-counter.   We will send a prescription for a muscle relaxer to your pharmacy. Do not drive or operate machinery when taking this medication. Follow-up with your primary care provider within a few days for reevaluation. Return to the ER for any worsening symptoms including numbness, tingling, weakness, bowel or bladder incontinence, or any other new or concerning symptoms.

## 2022-10-22 NOTE — ED Triage Notes (Addendum)
Pt presents to ED with c/o of lower back pain that started 2 days ago, pt denies injury. Pt states nausea but it only happened today while in triage. Pt ambulatory with steady gait to triage.   Pt severely hypertensive in triage with HX of same but did take meds last night at 1830.   Pt denies headache or issues with vision.

## 2022-10-22 NOTE — ED Provider Notes (Signed)
[facility] Provider Note    Event Date/Time   First MD Initiated Contact with Patient 10/22/22 [phone]     (approximate)   History   Back Pain   HPI  Kristy Kirby is a 47 year old female with history of hypertension, seizure disorder presenting to the emergency department for evaluation of back pain.  Patient reports a history of sciatica, and says that her pain feels similar, but this typically resolves with NSAIDs and radiates down her leg, but her pain today does not radiate and has not improved with over-the-counter medication.  She does report she has been taking her blood pressure medication as directed, but notes that her blood pressure is often with systolics above 200s.  She denies chest pain, but does report new onset nausea and generalized abdominal discomfort.  Denies headache, numbness, tingling, focal weakness.     Physical Exam   Triage Vital Signs: ED Triage Vitals  Encounter Vitals Group     BP 10/22/22 0721 (!) 224/154     Systolic BP Percentile --      Diastolic BP Percentile --      Pulse Rate 10/22/22 0718 96     Resp 10/22/22 0718 18     Temp 10/22/22 0718 98.4 F (36.9 C)     Temp Source 10/22/22 0718 Oral     SpO2 10/22/22 0718 100 %     Weight 10/22/22 0717 250 lb (113.4 kg)     Height 10/22/22 0717 5\' 2"  (1.575 m)     Head Circumference --      Peak Flow --      Pain Score 10/22/22 0717 0     Pain Loc --      Pain Education --      Exclude from Growth Chart --     Most recent vital signs: Vitals:   10/22/22 0845 10/22/22 1024  BP: (!) 182/110 (!) 170/125  Pulse: 81 72  Resp: (!) 23 (!) 21  Temp:    SpO2: 95% 92%     General: Awake, interactive  CV:  Regular rate, good peripheral perfusion.  Resp:  Lungs clear, unlabored respirations.  Abd:  Soft, nondistended, nontender to palpation Neuro:  Alert and oriented, normal extraocular movements, symmetric facial movement, sensation intact over bilateral upper and  lower extremities with 5 out of 5 strength.  Back:  No midline tenderness.  There is some reproducible tenderness to palpation over the paraspinous musculature in the right lower back.   ED Results / Procedures / Treatments   Labs (all labs ordered are listed, but only abnormal results are displayed) Labs Reviewed  CBC WITH DIFFERENTIAL/PLATELET - Abnormal; Notable for the following components:      Result Value   RDW 15.8 (*)    All other components within normal limits  COMPREHENSIVE METABOLIC PANEL - Abnormal; Notable for the following components:   Glucose, Bld 126 (*)    Calcium 8.8 (*)    All other components within normal limits  HCG, QUANTITATIVE, PREGNANCY  POC URINE PREG, ED  TROPONIN I (HIGH SENSITIVITY)     EKG EKG independently reviewed interpreted by myself (ER attending) demonstrates:    RADIOLOGY Imaging independently reviewed and interpreted by myself demonstrates:  CTA without evidence of dissection  PROCEDURES:  Critical Care performed: No  Procedures   MEDICATIONS ORDERED IN ED: Medications  lidocaine (LIDODERM) 5 % 1 patch (1 patch Transdermal Patch Applied 10/22/22 0756)  ondansetron (ZOFRAN) injection 4 mg (4  mg Intravenous Given 10/22/22 0756)  morphine (PF) 4 MG/ML injection 4 mg (4 mg Intravenous Given 10/22/22 0756)  metoprolol tartrate (LOPRESSOR) tablet 25 mg (25 mg Oral Given 10/22/22 0756)  iohexol (OMNIPAQUE) 350 MG/ML injection 100 mL (100 mLs Intravenous Contrast Given 10/22/22 0859)  diazepam (VALIUM) injection 10 mg (10 mg Intravenous Given 10/22/22 1017)  ketorolac (TORADOL) 15 MG/ML injection 15 mg (15 mg Intravenous Given 10/22/22 1016)     IMPRESSION / MDM / ASSESSMENT AND PLAN / ED COURSE  I reviewed the triage vital signs and the nursing notes.  Differential diagnosis includes, but is not limited to, acute aortic pathology including dissection, AAA, other acute intra-abdominal process including renal stone, musculoskeletal back  pain  Patient's presentation is most consistent with acute presentation with potential threat to life or bodily function.  47 year old female presenting to the emergency department for evaluation of back pain with associated nausea and abdominal pain found to be significantly hypertensive on presentation.  Ordered for her home morning antihypertensive medication as well as morphine and Zofran for symptomatic relief.  Given her degree of hypertension as well as new onset back and abdominal pain, CTA dissection protocol was ordered. Clinical Course as of 10/22/22 1042  Fri Oct 22, 2022  1010 CTA without evidence of dissection or other acute findings.  Lab work reassuring.  Suspect likely musculoskeletal pain.  Patient with some recurrent symptoms.  Will add multimodal pain control with Toradol and Valium. [NR]  1040 Patient reevaluated.  Somnolent after medications but arousable.  She does report significant improvement in her pain.  Blood pressure is improved to systolics of 160.  She is comfortable with discharge home, did confirm that patient will not be driving.  She is taken naproxen at home, will DC with a prescription for muscle relaxer as needed.  Strict return precautions provided.  Patient discharged stable condition. [NR]    Clinical Course User Index [NR] Trinna Post, MD     FINAL CLINICAL IMPRESSION(S) / ED DIAGNOSES   Final diagnoses:  Acute right-sided low back pain without sciatica  Generalized abdominal pain     Rx / DC Orders   ED Discharge Orders          Ordered    cyclobenzaprine (FLEXERIL) 10 MG tablet  3 times daily PRN        10/22/22 1042             Note:  This document was prepared using Dragon voice recognition software and may include unintentional dictation errors.   Trinna Post, MD 10/22/22 [phone]

## 2022-10-25 NOTE — Group Note (Deleted)

## 2022-10-26 ENCOUNTER — Emergency Department
Admission: EM | Admit: 2022-10-26 | Discharge: 2022-10-26 | Disposition: A | Discharge status: Home / Self Care | Payer: Medicaid Other | Attending: Emergency Medicine | Admitting: Emergency Medicine

## 2022-10-26 ENCOUNTER — Other Ambulatory Visit: Payer: Self-pay

## 2022-10-26 ENCOUNTER — Emergency Department: Payer: Medicaid Other

## 2022-10-26 DIAGNOSIS — M545 Low back pain, unspecified: Secondary | ICD-10-CM | POA: Insufficient documentation

## 2022-10-26 DIAGNOSIS — K5900 Constipation, unspecified: Secondary | ICD-10-CM | POA: Diagnosis not present

## 2022-10-26 DIAGNOSIS — M5440 Lumbago with sciatica, unspecified side: Secondary | ICD-10-CM

## 2022-10-26 DIAGNOSIS — I10 Essential (primary) hypertension: Secondary | ICD-10-CM | POA: Diagnosis not present

## 2022-10-26 DIAGNOSIS — R1084 Generalized abdominal pain: Secondary | ICD-10-CM

## 2022-10-26 LAB — CBC
HCT: 53.3 % — ABNORMAL HIGH (ref 36.0–46.0)
Hemoglobin: 17.3 g/dL — ABNORMAL HIGH (ref 12.0–15.0)
MCH: 29.4 pg (ref 26.0–34.0)
MCHC: 32.5 g/dL (ref 30.0–36.0)
MCV: 90.6 fL (ref 80.0–100.0)
Platelets: 204 10*3/uL (ref 150–400)
RBC: 5.88 MIL/uL — ABNORMAL HIGH (ref 3.87–5.11)
RDW: 15.6 % — ABNORMAL HIGH (ref 11.5–15.5)
WBC: 10.5 10*3/uL (ref 4.0–10.5)
nRBC: 0 % (ref 0.0–0.2)

## 2022-10-26 LAB — PREGNANCY, URINE: Preg Test, Ur: NEGATIVE

## 2022-10-26 LAB — URINALYSIS, ROUTINE W REFLEX MICROSCOPIC
Bilirubin Urine: NEGATIVE
Glucose, UA: NEGATIVE mg/dL
Hgb urine dipstick: NEGATIVE
Ketones, ur: NEGATIVE mg/dL
Leukocytes,Ua: NEGATIVE
Nitrite: NEGATIVE
Protein, ur: 30 mg/dL — AB
Specific Gravity, Urine: 1.032 — ABNORMAL HIGH (ref 1.005–1.030)
pH: 5 (ref 5.0–8.0)

## 2022-10-26 LAB — COMPREHENSIVE METABOLIC PANEL
ALT: 44 U/L (ref 0–44)
AST: 29 U/L (ref 15–41)
Albumin: 3.8 g/dL (ref 3.5–5.0)
Alkaline Phosphatase: 69 U/L (ref 38–126)
Anion gap: 8 (ref 5–15)
BUN: 35 mg/dL — ABNORMAL HIGH (ref 6–20)
CO2: 29 mmol/L (ref 22–32)
Calcium: 8.9 mg/dL (ref 8.9–10.3)
Chloride: 100 mmol/L (ref 98–111)
Creatinine, Ser: 1.36 mg/dL — ABNORMAL HIGH (ref 0.44–1.00)
GFR, Estimated: 49 mL/min — ABNORMAL LOW (ref >60)
Glucose, Bld: 121 mg/dL — ABNORMAL HIGH (ref 70–99)
Potassium: 3.6 mmol/L (ref 3.5–5.1)
Sodium: 137 mmol/L (ref 135–145)
Total Bilirubin: 0.5 mg/dL (ref 0.3–1.2)
Total Protein: 7.1 g/dL (ref 6.5–8.1)

## 2022-10-26 LAB — LIPASE, BLOOD: Lipase: 22 U/L (ref 11–51)

## 2022-10-26 MED ORDER — METOPROLOL TARTRATE 25 MG PO TABS
25.0000 mg | ORAL_TABLET | Freq: Once | ORAL | Status: AC
  Administered 2022-10-26: 25 mg via ORAL
  Filled 2022-10-26: qty 1

## 2022-10-26 MED ORDER — LOSARTAN POTASSIUM 50 MG PO TABS
100.0000 mg | ORAL_TABLET | Freq: Once | ORAL | Status: AC
  Administered 2022-10-26: 100 mg via ORAL
  Filled 2022-10-26: qty 2

## 2022-10-26 MED ORDER — LIDOCAINE 5 % EX PTCH
1.0000 | MEDICATED_PATCH | CUTANEOUS | Status: DC
  Administered 2022-10-26: 1 via TRANSDERMAL
  Filled 2022-10-26: qty 1

## 2022-10-26 MED ORDER — METAMUCIL SMOOTH TEXTURE 58.6 % PO POWD: 1.0000 | Freq: Two times a day (BID) | ORAL | 0 refills | Status: DC

## 2022-10-26 MED ORDER — POLYETHYLENE GLYCOL 3350 17 G PO PACK: 17.0000 g | PACK | Freq: Two times a day (BID) | ORAL | 0 refills | Status: AC

## 2022-10-26 MED ORDER — KETOROLAC TROMETHAMINE 30 MG/ML IJ SOLN
30.0000 mg | Freq: Once | INTRAMUSCULAR | Status: AC
  Administered 2022-10-26: 30 mg via INTRAMUSCULAR
  Filled 2022-10-26: qty 1

## 2022-10-26 NOTE — ED Triage Notes (Signed)
Pt comes with c/o belly pain, nausea constipation and some back pain. Pt states she was seen here couple days ago for back pain but her Bp was high so they didn't really address the back pain. Pt states 7/10 pain in lower back.

## 2022-10-26 NOTE — ED Provider Notes (Signed)
[facility] Provider Note    Event Date/Time   First MD Initiated Contact with Patient 10/26/22 [phone]     (approximate)   History   Constipation and Back Pain   HPI  Kristy Kirby is a 47 y.o. female   Past medical history of hypertension, depression anxiety, sciatica back pain longstanding who presents emerged department with multiple complaints.  Primarily she is here because of abdominal distention and no bowel movement for the last 4 days, no flatus, and has had nausea but no vomiting.  Continues to have longstanding back pain with sciatica unchanged.  She is hypertensive but did not take her hypertension medications today.  She denies any urinary symptoms, fevers, chills, respiratory symptoms, chest pain.   She had establish care at [facility] with a spinal surgeon years ago but has lost contact with them.  She does not have a primary doctor.  External Medical Documents Reviewed: Emergency department visit earlier this week with back pain in the setting of marked hypertension got a CT angiogram to rule out dissection which was normal.      Physical Exam   Triage Vital Signs: ED Triage Vitals  Encounter Vitals Group     BP 10/26/22 0730 (!) 181/123     Systolic BP Percentile --      Diastolic BP Percentile --      Pulse Rate 10/26/22 0730 90     Resp 10/26/22 0730 19     Temp 10/26/22 0730 (!) 97.5 F (36.4 C)     Temp src --      SpO2 10/26/22 0730 98 %     Weight 10/26/22 0803 249 lb 12.5 oz (113.3 kg)     Height 10/26/22 0803 5\' 2"  (1.575 m)     Head Circumference --      Peak Flow --      Pain Score 10/26/22 0729 7     Pain Loc --      Pain Education --      Exclude from Growth Chart --     Most recent vital signs: Vitals:   10/26/22 0730 10/26/22 0954  BP: (!) 181/123 (!) 167/119  Pulse: 90 77  Resp: 19   Temp: (!) 97.5 F (36.4 C)   SpO2: 98%     General: Awake, no distress.  CV:  Good peripheral perfusion.  Resp:  Normal  effort.  Abd:  No distention.  Other:  Awake alert comfortable hypertensive otherwise vital signs normal.  Soft abdomen with mild tenderness deep palpation all quadrants & noted distention though no rigidity or guarding.   ED Results / Procedures / Treatments   Labs (all labs ordered are listed, but only abnormal results are displayed) Labs Reviewed  CBC - Abnormal; Notable for the following components:      Result Value   RBC 5.88 (*)    Hemoglobin 17.3 (*)    HCT 53.3 (*)    RDW 15.6 (*)    All other components within normal limits  URINALYSIS, ROUTINE W REFLEX MICROSCOPIC - Abnormal; Notable for the following components:   Color, Urine AMBER (*)    APPearance CLOUDY (*)    Specific Gravity, Urine 1.032 (*)    Protein, ur 30 (*)    Bacteria, UA MANY (*)    All other components within normal limits  COMPREHENSIVE METABOLIC PANEL - Abnormal; Notable for the following components:   Glucose, Bld 121 (*)    BUN 35 (*)  Creatinine, Ser 1.36 (*)    GFR, Estimated 49 (*)    All other components within normal limits  PREGNANCY, URINE  LIPASE, BLOOD     I ordered and reviewed the above labs they are notable for slight increase in the creatinine from 1.1 previously to 1.3 ,, no leukocytosis  RADIOLOGY I independently reviewed and interpreted CT of the abdomen pelvis and see no obvious obstructive or inflammatory changes I also reviewed radiologist's formal read.   PROCEDURES:  Critical Care performed: No  Procedures   MEDICATIONS ORDERED IN ED: Medications  lidocaine (LIDODERM) 5 % 1 patch (1 patch Transdermal Patch Applied 10/26/22 0818)  metoprolol tartrate (LOPRESSOR) tablet 25 mg (25 mg Oral Given 10/26/22 0954)  losartan (COZAAR) tablet 100 mg (100 mg Oral Given 10/26/22 0955)  ketorolac (TORADOL) 30 MG/ML injection 30 mg (30 mg Intramuscular Given 10/26/22 0818)    IMPRESSION / MDM / ASSESSMENT AND PLAN / ED COURSE  I reviewed the triage vital signs and the nursing  notes.                                Patient's presentation is most consistent with acute presentation with potential threat to life or bodily function.  Differential diagnosis includes, but is not limited to, bowel obstruction, intra-abdominal infection like appendicitis or cholecystitis or diverticulitis, constipation related/gas pains, urinary tract infection, pregnancy related less likely given tubal ligation, kidney stone   The patient is on the cardiac monitor to evaluate for evidence of arrhythmia and/or significant heart rate changes.  MDM:     Is a patient with acute complaint of abdominal distention no bowel movements or flatus for the last 3 days with a history of tubal ligation no other abdominal surgeries.  Distention and tenderness to palpation, getting a CT scan to rule out bowel obstruction, considered but less likely other infectious etiologies surgical etiologies like appendicitis cholecystitis diverticulitis given no focal tenderness, fevers but will check labs.  Doubt urinary tract infection given no urinary symptoms.  If above workup is unremarkable, patient looks well and can be discharged home with bowel regimen, referral to PMD and spine specialist.  -- CT unremarkable.  Urinalysis shows bacteria but also squamous epithelial cells, no urinary infectious symptoms so we will defer treatment in this contaminated sample.  Patient stable.  Bowel regimen given.  Discharged with PMD follow-up.     FINAL CLINICAL IMPRESSION(S) / ED DIAGNOSES   Final diagnoses:  Uncontrolled hypertension  Back pain of lumbar region with sciatica  Constipation, unspecified constipation type  Generalized abdominal pain     Rx / DC Orders   ED Discharge Orders          Ordered    Ambulatory Referral to Primary Care (Establish Care)        10/26/22 0810    polyethylene glycol (MIRALAX) 17 g packet  2 times daily        10/26/22 0810    psyllium (METAMUCIL SMOOTH TEXTURE) 58.6 %  powder  2 times daily        10/26/22 0810             Note:  This document was prepared using Dragon voice recognition software and may include unintentional dictation errors.    Pilar Jarvis, MD 10/26/22 1120

## 2022-10-26 NOTE — ED Notes (Signed)
Dr.wong at bedside

## 2022-10-26 NOTE — ED Notes (Signed)
Pt taken to ct via stretcher with ct tech 

## 2022-10-26 NOTE — Discharge Instructions (Addendum)
Use medications for constipation as prescribed.  Continue taking your prescribed medications for back pain that you got in your last visit, and call Dr. Katrinka Blazing who is a spine specialist for follow-up appointment.  I also made a referral to a general doctor who will call you to set up an appointment.  Thank you for choosing [place] for your health care today!  Please see your primary doctor this week for a follow up appointment.   If you have any new, worsening, or unexpected symptoms call your doctor right away or come back to the emergency department for reevaluation.  It was my pleasure to care for you today.   Daneil Dan Modesto Charon, MD

## 2022-10-28 NOTE — Progress Notes (Unsigned)
Referring Physician:  Pilar Jarvis, MD [address] [place],  [place] [address]  Primary Physician:  Pcp, No  History of Present Illness: 11/02/2022 Kristy Kirby has a history of HTN, seizure disorder, neuropathy, obesity.   Seen in ED on 10/26/22 for constipation and chronic LBP. Also seen in ED on 10/22/22 with back pain.   She has constant LBP x years. She has no leg pain. LBP is worse with prolonged standing and sitting. Some relief with heat/ice. No numbness, tingling in her legs. She notes some weakness in her legs.   She had GI upset with naproxen and she stopped it a week ago. She notes frequent nausea, no vomiting. She notes constant abdominal pain. She is passing gas. She had normal BM this morning. She was seen for abdominal pain by ED on 9/10. She does not have a PCP.   She was given flexeril by ED. This has not helped her back.   Bowel/Bladder Dysfunction: none  She smokes 1/2 ppd x 25 years.   Conservative measures:  Physical therapy: none Multimodal medical therapy including regular antiinflammatories: flexeril, norco, mobic, prednisone, naproxen Injections: no epidural steroid injections  Past Surgery: no spinal surgery  Blase Mess has no symptoms of cervical myelopathy.  The symptoms are causing a significant impact on the patient's life.   Review of Systems:  A 10 point review of systems is negative, except for the pertinent positives and negatives detailed in the HPI.  Past Medical History: Past Medical History:  Diagnosis Date   Anxiety    Arthritis    Rheumatoid   Depression    HTN (hypertension)    Sciatica    Seizures (HCC)     Past Surgical History: Past Surgical History:  Procedure Laterality Date   TUBAL LIGATION      Allergies: Allergies as of 11/02/2022 - Review Complete 11/02/2022  Allergen Reaction Noted   Banana  01/28/2016   Coconut fatty acid  01/28/2016   Penicillins  01/28/2016   Sulfa antibiotics Hives  11/25/2011   Coconut (cocos nucifera) Rash 04/12/2014    Medications: Outpatient Encounter Medications as of 11/02/2022  Medication Sig   albuterol (VENTOLIN HFA) 108 (90 Base) MCG/ACT inhaler Inhale 1-2 puffs into the lungs every 6 (six) hours as needed for wheezing or shortness of breath.   clonazePAM (KLONOPIN) 0.5 MG tablet Take by mouth.   cyclobenzaprine (FLEXERIL) 10 MG tablet Take 1 tablet (10 mg total) by mouth 3 (three) times daily as needed for muscle spasms.   FLUoxetine (PROZAC) 10 MG capsule Take 1 capsule by mouth once daily   hydrochlorothiazide (HYDRODIURIL) 25 MG tablet Take 1 tablet (25 mg total) by mouth daily.   HYDROcodone-acetaminophen (NORCO/VICODIN) 5-325 MG tablet Take 1 tablet by mouth every 6 (six) hours as needed for moderate pain.   lisinopril (ZESTRIL) 10 MG tablet Take by mouth.   losartan (COZAAR) 100 MG tablet Take by mouth.   meloxicam (MOBIC) 7.5 MG tablet Take 1 tablet (7.5 mg total) by mouth daily.   metoprolol tartrate (LOPRESSOR) 25 MG tablet Take 1 tablet (25 mg total) by mouth 2 (two) times daily.   polyethylene glycol (MIRALAX) 17 g packet Take 17 g by mouth 2 (two) times daily.   potassium chloride SA (KLOR-CON M20) 20 MEQ tablet Take 1 tablet (20 mEq total) by mouth daily.   predniSONE (STERAPRED UNI-PAK 21 TAB) 10 MG (21) TBPK tablet Take 6 pills on day one then decrease by 1 pill  each day   trazodone (DESYREL) 300 MG tablet Take 300 mg by mouth at bedtime.   [DISCONTINUED] psyllium (METAMUCIL SMOOTH TEXTURE) 58.6 % powder Take 1 packet by mouth in the morning and at bedtime.   escitalopram (LEXAPRO) 10 MG tablet Take by mouth.   levETIRAcetam (KEPPRA) 500 MG tablet Take 1 tablet (500 mg total) by mouth 2 (two) times daily.   norethindrone (AYGESTIN) 5 MG tablet Take 1 tablet (5 mg total) by mouth daily for 10 days. (Patient not taking: Reported on 08/31/2022)   phenytoin (DILANTIN) 100 MG ER capsule Take 3 capsules (300 mg total) by mouth 2 (two)  times daily.   No facility-administered encounter medications on file as of 11/02/2022.    Social History: Social History   Tobacco Use   Smoking status: Every Day    Current packs/day: 0.50    Types: Cigarettes   Smokeless tobacco: Never   Tobacco comments:    05. Pack a day   Vaping Use   Vaping status: Former  Substance Use Topics   Alcohol use: No   Drug use: No    Family Medical History: Family History  Problem Relation Age of Onset   Hypertension Mother    Diabetes Mother    Stroke Mother    Hypertension Father    Diabetes Father    Heart disease Father    Stroke Father    Diabetes Sister    Stroke Paternal Grandmother    Stroke Paternal Grandfather     Physical Examination: Vitals:   11/02/22 0838  BP: 130/77    General: Patient is well developed, well nourished, calm, collected, and in no apparent distress. Attention to examination is appropriate.  Respiratory: Patient is breathing without any difficulty.   NEUROLOGICAL:     Awake, alert, oriented to person, place, and time.  Speech is clear and fluent. Fund of knowledge is appropriate.   Cranial Nerves: Pupils equal round and reactive to light.  Facial tone is symmetric.    She has diffuse lower lumbar tenderness.   No abnormal lesions on exposed skin.   Strength: Side Biceps Triceps Deltoid Interossei Grip Wrist Ext. Wrist Flex.  R 5 5 5 5 5 5 5   L 5 5 5 5 5 5 5    Side Iliopsoas Quads Hamstring PF DF EHL  R 5 5 5 5 5 5   L 5 5 5 5 5 5    Reflexes are 2+ and symmetric at the biceps, brachioradialis, patella and achilles.   Hoffman's is absent.  Clonus is not present.   Bilateral upper and lower extremity sensation is intact to light touch.     Gait is normal.    Medical Decision Making  Imaging: Lumbar spine xrays dated 03/14/22:  FINDINGS: Two views study shows no fracture or subluxation. Intervertebral disc spaces are relatively well preserved. SI joints are unremarkable.    IMPRESSION: Negative.     Electronically Signed   By: Kennith Center M.D.   On: 03/14/2022 10:33  I have personally reviewed the images and agree with the above interpretation.  Assessment and Plan: Kristy Kirby is a pleasant 47 y.o. female who has constant LBP x years. She has no leg pain. LBP is worse with prolonged standing and sitting. Some relief with heat/ice. No numbness, tingling in her legs. She notes some weakness in her legs.   Previous lumbar xrays from January look okay. LBP appears to be lumbar mediated.   She was seen in ED on  10/26/22 for abdominal pain and this has not improved. CT of abdomen/pelvis was read with no acute findings. She still has nausea, but no vomiting. She had normal BM this morning.   Treatment options discussed with patient and following plan made:   - MRI of lumbar spine to further evaluate chronic LBP.  - Hold on NSAIDs due to current abdominal pain.  - Referral to [facility] (Dr. Caralee Ates) to establish care.  - If her abdominal pain gets worse, I recommend she go back to ED.  - Will schedule follow up visit to review MRI results once I get them back.   I spent a total of 30 minutes in face-to-face and non-face-to-face activities related to this patient's care today including review of outside records, review of imaging, review of symptoms, physical exam, discussion of differential diagnosis, discussion of treatment options, and documentation.   Thank you for involving me in the care of this patient.   Drake Leach PA-C Dept. of Neurosurgery

## 2022-11-02 ENCOUNTER — Encounter: Payer: Self-pay | Admitting: Orthopedic Surgery

## 2022-11-02 ENCOUNTER — Ambulatory Visit (INDEPENDENT_AMBULATORY_CARE_PROVIDER_SITE_OTHER): Payer: Medicaid Other | Admitting: Orthopedic Surgery

## 2022-11-02 VITALS — BP 130/77 | Ht 62.0 in | Wt 271.0 lb

## 2022-11-02 DIAGNOSIS — G8929 Other chronic pain: Secondary | ICD-10-CM | POA: Diagnosis not present

## 2022-11-02 DIAGNOSIS — M545 Low back pain, unspecified: Secondary | ICD-10-CM

## 2022-11-02 NOTE — Patient Instructions (Addendum)
It was so nice to see you today. Thank you so much for coming in.    I want to get an MRI of your lower back to look into things further. We will get this approved through your insurance and [facility] will call you to schedule the appointment.   [facility] (building with the white pillars) is [address] off of [address]. The address is [address], [place], [place] [address].   After you have the MRI, it takes 10-14 days for me to get the results back. Once I have them, we will call you to schedule a follow up visit with me to review them.   If your abdominal pain gets worse, I would go back to the emergency room.   I put in a referral for you to see Dr. Caralee Ates at [facility]. They should call you. Their number is  [phone].   Please do not hesitate to call if you have any questions or concerns. You can also message me in MyChart.   Drake Leach PA-C [phone]

## 2022-11-07 ENCOUNTER — Emergency Department: Payer: Medicaid Other

## 2022-11-07 ENCOUNTER — Emergency Department
Admission: EM | Admit: 2022-11-07 | Discharge: 2022-11-07 | Disposition: A | Discharge status: Home / Self Care | Payer: Medicaid Other | Attending: Emergency Medicine | Admitting: Emergency Medicine

## 2022-11-07 ENCOUNTER — Other Ambulatory Visit: Payer: Self-pay

## 2022-11-07 DIAGNOSIS — I10 Essential (primary) hypertension: Secondary | ICD-10-CM | POA: Insufficient documentation

## 2022-11-07 DIAGNOSIS — M25552 Pain in left hip: Secondary | ICD-10-CM | POA: Insufficient documentation

## 2022-11-07 DIAGNOSIS — M7602 Gluteal tendinitis, left hip: Secondary | ICD-10-CM | POA: Diagnosis not present

## 2022-11-07 DIAGNOSIS — M779 Enthesopathy, unspecified: Secondary | ICD-10-CM

## 2022-11-07 MED ORDER — LIDOCAINE 5 % EX PTCH: 1.0000 | MEDICATED_PATCH | Freq: Two times a day (BID) | CUTANEOUS | 0 refills | Status: DC

## 2022-11-07 MED ORDER — PREDNISONE 20 MG PO TABS: 60.0000 mg | ORAL_TABLET | Freq: Every day | ORAL | 0 refills | Status: AC

## 2022-11-07 MED ORDER — OXYCODONE-ACETAMINOPHEN 5-325 MG PO TABS
1.0000 | ORAL_TABLET | Freq: Once | ORAL | Status: AC
  Administered 2022-11-07: 1 via ORAL
  Filled 2022-11-07: qty 1

## 2022-11-07 MED ORDER — KETOROLAC TROMETHAMINE 30 MG/ML IJ SOLN
15.0000 mg | Freq: Once | INTRAMUSCULAR | Status: AC
  Administered 2022-11-07: 15 mg via INTRAMUSCULAR
  Filled 2022-11-07: qty 1

## 2022-11-07 NOTE — ED Triage Notes (Signed)
Pt reports left side hip pain since Friday and reports the pain has worsened. Pt reports she has been unable to put weight on leg and denies any injury.

## 2022-11-07 NOTE — ED Provider Notes (Signed)
[facility] Provider Note    Event Date/Time   First MD Initiated Contact with Patient 11/07/22 0701     (approximate)   History   Chief Complaint Hip Pain   HPI  Kristy Kirby is a 47 y.o. female with past medical history of hypertension and seizures who presents to the ED complaining of hip pain.  Patient reports that she has had about 1 week of worsening pain in her left hip, denies any associated injuries.  She states that the hip is painful to move and she has had difficulty bearing weight on it.  She denies any pain in her back and has not had any pain shooting down her leg.  She denies any numbness or weakness in the leg.  She has been taking Tylenol without relief.     Physical Exam   Triage Vital Signs: ED Triage Vitals  Encounter Vitals Group     BP 11/07/22 0535 (!) 181/118     Systolic BP Percentile --      Diastolic BP Percentile --      Pulse Rate 11/07/22 0535 88     Resp 11/07/22 0535 16     Temp 11/07/22 0535 98.4 F (36.9 C)     Temp Source 11/07/22 0535 Oral     SpO2 11/07/22 0535 96 %     Weight 11/07/22 0534 270 lb (122.5 kg)     Height 11/07/22 0534 5\' 2"  (1.575 m)     Head Circumference --      Peak Flow --      Pain Score 11/07/22 0534 10     Pain Loc --      Pain Education --      Exclude from Growth Chart --     Most recent vital signs: Vitals:   11/07/22 0725 11/07/22 0730  BP: (!) 173/135 (!) 179/130  Pulse: 86 88  Resp:  18  Temp:    SpO2: 98% 98%    Constitutional: Alert and oriented. Eyes: Conjunctivae are normal. Head: Atraumatic. Nose: No congestion/rhinnorhea. Mouth/Throat: Mucous membranes are moist.  Cardiovascular: Normal rate, regular rhythm. Grossly normal heart sounds.  2+ radial and DP pulses bilaterally. Respiratory: Normal respiratory effort.  No retractions. Lungs CTAB. Gastrointestinal: Soft and nontender. No distention. Musculoskeletal: No lower extremity tenderness nor edema.  Able  to range left hip but with significant pain, no pain upon range of motion of left knee or ankle. Neurologic:  Normal speech and language. No gross focal neurologic deficits are appreciated.    ED Results / Procedures / Treatments   Labs (all labs ordered are listed, but only abnormal results are displayed) Labs Reviewed - No data to display  RADIOLOGY Left hip x-ray reviewed and interpreted by me with no fracture or dislocation.  PROCEDURES:  Critical Care performed: No  Procedures   MEDICATIONS ORDERED IN ED: Medications  ketorolac (TORADOL) 30 MG/ML injection 15 mg (15 mg Intramuscular Given 11/07/22 0743)  oxyCODONE-acetaminophen (PERCOCET/ROXICET) 5-325 MG per tablet 1 tablet (1 tablet Oral Given 11/07/22 0744)     IMPRESSION / MDM / ASSESSMENT AND PLAN / ED COURSE  I reviewed the triage vital signs and the nursing notes.                              47 y.o. female with past medical history of hypertension and seizures who presents to the ED with 1 week of  worsening atraumatic left hip pain.  Patient's presentation is most consistent with acute complicated illness / injury requiring diagnostic workup.  Differential diagnosis includes, but is not limited to, fracture, dislocation, arthritis, septic arthritis.  Patient well-appearing and in no acute distress, vital signs remarkable for hypertension but otherwise reassuring.  She is neurovascular intact to her distal left lower extremity, no signs of infection or DVT.  She is able to range the left hip but has significant pain while doing so, no findings concerning for septic arthritis.  X-ray imaging is unremarkable, patient had improvement in pain following dose of IM Toradol and Percocet.  She is appropriate for outpatient follow-up with PCP regarding left hip pain, also noted to be significantly hypertensive here in the ED but no evidence of hypertensive emergency.  Patient states that she has not taken her blood pressure  medication yet this morning, was counseled to follow-up with her PCP regarding blood pressure as well.  She was counseled to return to the ED for new or worsening symptoms, patient agrees with plan.      FINAL CLINICAL IMPRESSION(S) / ED DIAGNOSES   Final diagnoses:  Left hip pain  Tendonitis  Uncontrolled hypertension     Rx / DC Orders   ED Discharge Orders          Ordered    lidocaine (LIDODERM) 5 %  Every 12 hours        11/07/22 0821    predniSONE (DELTASONE) 20 MG tablet  Daily with breakfast        11/07/22 1610             Note:  This document was prepared using Dragon voice recognition software and may include unintentional dictation errors.   Chesley Noon, MD 11/07/22 [id number]

## 2022-11-07 NOTE — ED Notes (Signed)
Pt unable to provide urine specimen at this time. Provided a cup of ice water.

## 2022-11-07 NOTE — ED Notes (Signed)
Patient transported to X-ray 

## 2022-12-29 ENCOUNTER — Ambulatory Visit
Admission: RE | Admit: 2022-12-29 | Discharge: 2022-12-29 | Disposition: A | Discharge status: Home / Self Care | Payer: Medicaid Other | Source: Ambulatory Visit | Attending: Orthopedic Surgery | Admitting: Orthopedic Surgery

## 2022-12-29 DIAGNOSIS — G8929 Other chronic pain: Secondary | ICD-10-CM | POA: Insufficient documentation

## 2022-12-29 DIAGNOSIS — M545 Low back pain, unspecified: Secondary | ICD-10-CM | POA: Insufficient documentation

## 2023-01-27 NOTE — Progress Notes (Signed)
Referring Physician:  Dannielle Karvonen, MD [address] [place],  [place] [address]  Primary Physician:  Dannielle Karvonen, MD  History of Present Illness: 01/28/2023 Kristy Kirby has a history of HTN, seizure disorder, neuropathy, obesity.   Last seen by me on 11/02/22 for LBP with no leg pain. Previous lumbar xrays from January look okay. LBP appeared to be lumbar mediated.   She is here to review her lumbar MRI scan. She was to hold on NSAIDs due to abdominal pain at her last visit.   She has no current LBP. She recently injured her right knee but has no radicular leg pain. No numbness, tingling, or weakness.   Her abdominal pain is gone as well.   Bowel/Bladder Dysfunction: none  She smokes 1/2 ppd x 25 years.   Conservative measures:  Physical therapy: none Multimodal medical therapy including regular antiinflammatories: flexeril, norco, mobic, prednisone, naproxen Injections: no epidural steroid injections  Past Surgery: no spinal surgery  Kristy Kirby has no symptoms of cervical myelopathy.  The symptoms are causing a significant impact on the patient's life.   Review of Systems:  A 10 point review of systems is negative, except for the pertinent positives and negatives detailed in the HPI.  Past Medical History: Past Medical History:  Diagnosis Date   Anxiety    Arthritis    Rheumatoid   Depression    HTN (hypertension)    Sciatica    Seizures (HCC)     Past Surgical History: Past Surgical History:  Procedure Laterality Date   TUBAL LIGATION      Allergies: Allergies as of 01/28/2023 - Review Complete 01/28/2023  Allergen Reaction Noted   Banana  01/28/2016   Coconut fatty acid  01/28/2016   Penicillins  01/28/2016   Sulfa antibiotics Hives 11/25/2011   Coconut (cocos nucifera) Rash 04/12/2014    Medications: Outpatient Encounter Medications as of 01/28/2023  Medication Sig   albuterol (VENTOLIN HFA) 108 (90 Base) MCG/ACT  inhaler Inhale 1-2 puffs into the lungs every 6 (six) hours as needed for wheezing or shortness of breath.   amLODipine (NORVASC) 10 MG tablet Take by mouth.   atorvastatin (LIPITOR) 20 MG tablet Take 1 tablet by mouth daily.   celecoxib (CELEBREX) 200 MG capsule Take 200 mg by mouth daily.   cyclobenzaprine (FLEXERIL) 10 MG tablet Take 1 tablet (10 mg total) by mouth 3 (three) times daily as needed for muscle spasms.   escitalopram (LEXAPRO) 10 MG tablet Take by mouth.   hydrochlorothiazide (HYDRODIURIL) 25 MG tablet Take 1 tablet (25 mg total) by mouth daily.   levETIRAcetam (KEPPRA) 500 MG tablet Take 1 tablet (500 mg total) by mouth 2 (two) times daily.   lisinopril (ZESTRIL) 10 MG tablet Take by mouth.   losartan (COZAAR) 100 MG tablet Take by mouth.   metoprolol tartrate (LOPRESSOR) 25 MG tablet Take 1 tablet (25 mg total) by mouth 2 (two) times daily.   phenytoin (DILANTIN) 100 MG ER capsule Take 3 capsules (300 mg total) by mouth 2 (two) times daily.   potassium chloride SA (KLOR-CON M20) 20 MEQ tablet Take 1 tablet (20 mEq total) by mouth daily.   trazodone (DESYREL) 300 MG tablet Take 300 mg by mouth at bedtime.   Varenicline Tartrate, Starter, 0.5 MG X 11 & 1 MG X 42 TBPK Take one 0.5mg  tab once daily for 3 days,then increase to one 0.5mg  tab twice daily for 4 days,then increase to one  1mg  tab twice daily.   HYDROcodone-acetaminophen (NORCO/VICODIN) 5-325 MG tablet Take 1 tablet by mouth every 6 (six) hours as needed for moderate pain. (Patient not taking: Reported on 01/28/2023)   traMADol (ULTRAM) 50 MG tablet Take by mouth. (Patient not taking: Reported on 01/28/2023)   [DISCONTINUED] clonazePAM (KLONOPIN) 0.5 MG tablet Take by mouth.   [DISCONTINUED] FLUoxetine (PROZAC) 10 MG capsule Take 1 capsule by mouth once daily   [DISCONTINUED] lidocaine (LIDODERM) 5 % Place 1 patch onto the skin every 12 (twelve) hours. Remove & Discard patch within 12 hours or as directed by MD    [DISCONTINUED] norethindrone (AYGESTIN) 5 MG tablet Take 1 tablet (5 mg total) by mouth daily for 10 days. (Patient not taking: Reported on 08/31/2022)   No facility-administered encounter medications on file as of 01/28/2023.    Social History: Social History   Tobacco Use   Smoking status: Every Day    Current packs/day: 0.50    Types: Cigarettes   Smokeless tobacco: Never   Tobacco comments:    05. Pack a day   Vaping Use   Vaping status: Former  Substance Use Topics   Alcohol use: No   Drug use: No    Family Medical History: Family History  Problem Relation Age of Onset   Hypertension Mother    Diabetes Mother    Stroke Mother    Hypertension Father    Diabetes Father    Heart disease Father    Stroke Father    Diabetes Sister    Stroke Paternal Grandmother    Stroke Paternal Grandfather     Physical Examination: Vitals:   01/28/23 1025 01/28/23 1045  BP: (!) 208/156 (!) 204/148      Awake, alert, oriented to person, place, and time.  Speech is clear and fluent. Fund of knowledge is appropriate.   Cranial Nerves: Pupils equal round and reactive to light.  Facial tone is symmetric.    She has no lower lumbar tenderness.   No abnormal lesions on exposed skin.   Strength: Side Biceps Triceps Deltoid Interossei Grip Wrist Ext. Wrist Flex.  R 5 5 5 5 5 5 5   L 5 5 5 5 5 5 5    Side Iliopsoas Quads Hamstring PF DF EHL  R 5 5 5 5 5 5   L 5 5 5 5 5 5    No gross weakness with strength testing of right leg, but this makes her knee hurt.   Reflexes are 2+ and symmetric at the biceps, brachioradialis, patella and achilles, right patella not tested due to knee pain.   Hoffman's is absent.  Clonus is not present.   Bilateral upper and lower extremity sensation is intact to light touch.     Gait is normal.    Medical Decision Making  Imaging: Lumbar MRI dated 12/29/22:  FINDINGS: Segmentation: The lowest lumbar type non-rib-bearing vertebra is labeled as  L5.   Alignment:  No vertebral subluxation is observed.   Vertebrae:  Congenitally short pedicles in the lumbar spine.   Mild disc desiccation at L3-4 and L4-5.   Degenerative facet and perifacet edema bilaterally at L4-5.   Conus medullaris and cauda equina: Conus extends to the L1 level. Conus and cauda equina appear normal.   Paraspinal and other soft tissues: Unremarkable   Disc levels:   L1-2: Unremarkable   L2-3: Borderline central narrowing of the thecal sac due to short pedicles.   L3-4: Mild central narrowing of the thecal sac and borderline  bilateral foraminal stenosis due to disc bulge short pedicles.   L4-5: Mild right and borderline left foraminal stenosis along with mild central narrowing of the thecal sac due to disc bulge, intervertebral and facet spurring, and short pedicles.   L5-S1: No impingement.  Mild facet arthropathy.   IMPRESSION: 1. Lumbar spondylosis, congenitally short pedicles, and degenerative disc disease, causing mild impingement at L3-4 and L4-5. 2. Degenerative facet and perifacet edema bilaterally at L4-5.     Electronically Signed   By: Gaylyn Rong M.D.   On: 01/18/2023 13:10  I have personally reviewed the images and agree with the above interpretation.  Assessment and Plan: Kristy Kirby is doing much better.   She has no current LBP. She recently injured her right knee but has no radicular leg pain. No numbness, tingling, or weakness.   She has known lumbar spondylosis with congenitally short pedicles. She has very mild central stenosis L3-L5 with mild bilateral foraminal stenosis.   LBP was likely due to underlying spondylosis.   Treatment options discussed with patient and following plan made:   - Hold on further treatment as she has no pain.  - May consider lumbar PT once right knee pain is better, she will let me know.  - Will message her in 2 months to check on her.   BP was elevated. No symptoms of chest pain,  shortness of breath, blurry vision, or headaches. She has not taking her blood pressure medication for today yet.   I recommend that she go to ED due to her elevated blood pressure and she declines.   She states her BP is usually 150s/90s. She will go home and take her medication then recheck her blood pressure. She will  call PCP if not improved. If she develops CP, SOB, blurry vision, or headaches, then she will go to ED.     I spent a total of 20 minutes in face-to-face and non-face-to-face activities related to this patient's care today including review of outside records, review of imaging, review of symptoms, physical exam, discussion of differential diagnosis, discussion of treatment options, and documentation.   Drake Leach PA-C Dept. of Neurosurgery

## 2023-01-28 ENCOUNTER — Encounter: Payer: Self-pay | Admitting: Orthopedic Surgery

## 2023-01-28 ENCOUNTER — Ambulatory Visit (INDEPENDENT_AMBULATORY_CARE_PROVIDER_SITE_OTHER): Payer: Medicaid Other | Admitting: Orthopedic Surgery

## 2023-01-28 VITALS — BP 204/148 | Ht 62.0 in | Wt 270.0 lb

## 2023-01-28 DIAGNOSIS — M48061 Spinal stenosis, lumbar region without neurogenic claudication: Secondary | ICD-10-CM | POA: Diagnosis not present

## 2023-01-28 DIAGNOSIS — M47816 Spondylosis without myelopathy or radiculopathy, lumbar region: Secondary | ICD-10-CM | POA: Diagnosis not present

## 2023-01-28 NOTE — Progress Notes (Signed)
Called to check on her. She took her BP meds and rechecked her blood pressure. It was 145/95. She feels good with no CP, SOB,h/a, or blurry vision.   She will check BP daily and keep log then call PCP with it.

## 2023-02-03 ENCOUNTER — Emergency Department
Admission: EM | Admit: 2023-02-03 | Discharge: 2023-02-03 | Disposition: A | Discharge status: Home / Self Care | Payer: Medicaid Other | Attending: Emergency Medicine | Admitting: Emergency Medicine

## 2023-02-03 ENCOUNTER — Other Ambulatory Visit: Payer: Self-pay

## 2023-02-03 DIAGNOSIS — R569 Unspecified convulsions: Secondary | ICD-10-CM | POA: Diagnosis present

## 2023-02-03 DIAGNOSIS — I10 Essential (primary) hypertension: Secondary | ICD-10-CM | POA: Insufficient documentation

## 2023-02-03 NOTE — ED Provider Notes (Signed)
[facility] Provider Note    Event Date/Time   First MD Initiated Contact with Patient 02/03/23 1946     (approximate)   History   Chief Complaint: Seizures and Hypertension   HPI  Kristy Kirby is a 47 y.o. female with a history of seizures who is brought to the ED by EMS due to a seizure.  Patient was at a high school basketball game in a gym, which she describes as being really hot.  She got up to go outside to cool down, and was going to go home, but on her way out at the gym she had a seizure.  Patient reports she was in her usual state of health before hand.  She reports that hot environments tend to trigger her seizures.  Last seizure was in March of this year.  She is compliant with her medications.  Denies any other acute complaints.  Wishes to defer any workup at this time, feels back to baseline and wishes to be discharged home.  Does not need refills of any medications.          Physical Exam   Triage Vital Signs: ED Triage Vitals  Encounter Vitals Group     BP 02/03/23 1930 (!) 195/136     Systolic BP Percentile --      Diastolic BP Percentile --      Pulse Rate 02/03/23 1930 (!) 103     Resp 02/03/23 1930 20     Temp 02/03/23 1930 98.6 F (37 C)     Temp Source 02/03/23 1930 Oral     SpO2 02/03/23 1930 100 %     Weight 02/03/23 1931 260 lb (117.9 kg)     Height 02/03/23 1931 5\' 2"  (1.575 m)     Head Circumference --      Peak Flow --      Pain Score 02/03/23 1931 0     Pain Loc --      Pain Education --      Exclude from Growth Chart --     Most recent vital signs: Vitals:   02/03/23 1930 02/03/23 1933  BP: (!) 195/136   Pulse: (!) 103   Resp: 20   Temp: 98.6 F (37 C)   SpO2: 100% 100%    General: Awake, no distress.  CV:  Good peripheral perfusion.  Resp:  Normal effort.  Abd:  No distention.  Other:  Ambulatory with steady gait, normal mental status, clear speech, rational thought process   ED Results /  Procedures / Treatments   Labs (all labs ordered are listed, but only abnormal results are displayed) Labs Reviewed - No data to display   EKG Interpreted by me Sinus rhythm, rate of 99.  Left axis, normal intervals.  Poor R wave progression.  Normal ST segments and T waves   RADIOLOGY    PROCEDURES:  Procedures   MEDICATIONS ORDERED IN ED: Medications - No data to display   IMPRESSION / MDM / ASSESSMENT AND PLAN / ED COURSE  I reviewed the triage vital signs and the nursing notes.  DDx: Epilepsy.  Doubt anemia, ACS, electrolyte abnormality, stroke, dissection, renal failure  Patient's presentation is most consistent with exacerbation of chronic illness.  Patient presents with seizure with a known history of epilepsy.  She was was in her usual state of health, exposed to her usual seizure trigger, and is now back to baseline.  Patient wishes to decline any workup right  now which I think is reasonable.  She has an appointment with her primary care doctor tomorrow.  She attributes her uncontrolled hypertension to being overdue for her nighttime medication including her antihypertensives.  She will seek care as needed, stable for discharge home.       FINAL CLINICAL IMPRESSION(S) / ED DIAGNOSES   Final diagnoses:  Seizure (HCC)  Uncontrolled hypertension     Rx / DC Orders   ED Discharge Orders     None        Note:  This document was prepared using Dragon voice recognition software and may include unintentional dictation errors.   Sharman Cheek, MD 02/03/23 2049

## 2023-02-03 NOTE — ED Notes (Signed)
Pt informed this RN that she would like to go home and that she refuses treatment. Pt is A/ox4, no acute distress, ambulatory and answering questions appropriately. Pt states she has hx of seizures and she knows what to do for them. This RN referred this information to the provider.

## 2023-02-03 NOTE — ED Triage Notes (Signed)
[facility] after a witnessed seizure.  Patient was at a high school basketball game and got really hot, which she says triggers her seizures.  She has not had her seizure medication today.  She has not had a seizure since March.  Seizure lasted approximately 3 minutes.  EMS reports temp as 99.4 on scene.  Temp down to normal on arrival.

## 2023-11-25 ENCOUNTER — Emergency Department
Admission: EM | Admit: 2023-11-25 | Discharge: 2023-11-25 | Disposition: A | Discharge status: Home / Self Care | Attending: Emergency Medicine | Admitting: Emergency Medicine

## 2023-11-25 ENCOUNTER — Other Ambulatory Visit: Payer: Self-pay

## 2023-11-25 ENCOUNTER — Encounter: Payer: Self-pay | Admitting: Emergency Medicine

## 2023-11-25 DIAGNOSIS — R040 Epistaxis: Secondary | ICD-10-CM | POA: Insufficient documentation

## 2023-11-25 MED ORDER — OXYMETAZOLINE HCL 0.05 % NA SOLN
1.0000 | Freq: Once | NASAL | Status: AC
  Administered 2023-11-25: 1 via NASAL
  Filled 2023-11-25: qty 30

## 2023-11-25 NOTE — ED Provider Notes (Signed)
   [facility] Provider Note    Event Date/Time   First MD Initiated Contact with Patient 11/25/23 [phone]     (approximate)   History   Epistaxis   HPI  CORIE ALLIS is a 48 y.o. female who presents to the ED for evaluation of Epistaxis   Patient presents for evaluation of epistaxis.   no falls or trauma.  Receives Afrin in triage and by the time I see her she reports significant improvement of bleeding   Physical Exam   Triage Vital Signs: ED Triage Vitals  Encounter Vitals Group     BP 11/25/23 0118 (!) 207/139     Girls Systolic BP Percentile --      Girls Diastolic BP Percentile --      Boys Systolic BP Percentile --      Boys Diastolic BP Percentile --      Pulse Rate 11/25/23 0118 (!) 106     Resp 11/25/23 0118 18     Temp 11/25/23 0118 98.4 F (36.9 C)     Temp Source 11/25/23 0118 Oral     SpO2 11/25/23 0118 97 %     Weight --      Height --      Head Circumference --      Peak Flow --      Pain Score 11/25/23 0119 0     Pain Loc --      Pain Education --      Exclude from Growth Chart --     Most recent vital signs: Vitals:   11/25/23 0118 11/25/23 0211  BP: (!) 207/139 (!) 192/121  Pulse: (!) 106   Resp: 18   Temp: 98.4 F (36.9 C)   SpO2: 97%     General: Awake, no distress.  CV:  Good peripheral perfusion.  Resp:  Normal effort.  Abd:  No distention.  MSK:  No deformity noted.  Neuro:  No focal deficits appreciated. Other:  No actively bleeding vessels appreciated.  No posterior bleeding, good visualization of posterior oropharynx.   ED Results / Procedures / Treatments   Labs (all labs ordered are listed, but only abnormal results are displayed) Labs Reviewed - No data to display  EKG   RADIOLOGY   Official radiology report(s): No results found.  PROCEDURES and INTERVENTIONS:  Procedures  Medications  oxymetazoline (AFRIN) 0.05 % nasal spray 1 spray (1 spray Each Nare Given 11/25/23 0127)      IMPRESSION / MDM / ASSESSMENT AND PLAN / ED COURSE  I reviewed the triage vital signs and the nursing notes.  Differential diagnosis includes, but is not limited to, anterior epistaxis, posterior epistaxis, trauma  Patient presents with anterior epistaxis resolved with Afrin spray and suitable for outpatient management.      FINAL CLINICAL IMPRESSION(S) / ED DIAGNOSES   Final diagnoses:  None     Rx / DC Orders   ED Discharge Orders     None        Note:  This document was prepared using Dragon voice recognition software and may include unintentional dictation errors.   Claudene Rover, MD 11/25/23 [id number]

## 2023-11-25 NOTE — ED Triage Notes (Signed)
 Pt arrives Pov, ambulatory to triage, gait steady, no acute distress noted c/o nosebleed that started 1 hour pta. Denies injury or blood thinners. Pt actively bleeding from bilateral nares. Device placed on nose to help stop the bleeding. Denies chest or head pain, sob, or dizziness. Pt reports hx of nosebleeds but never like this one.
